# Patient Record
Sex: Male | Born: 1965
Health system: Southern US, Community
[De-identification: ages and names within clinical notes are randomized; demographics above are authoritative.]

## PROBLEM LIST (undated history)

## (undated) ENCOUNTER — Emergency Department (HOSPITAL_COMMUNITY): Admission: EM | Payer: Managed Care, Other (non HMO) | Source: Home / Self Care

## (undated) DIAGNOSIS — A630 Anogenital (venereal) warts: Secondary | ICD-10-CM

## (undated) DIAGNOSIS — E785 Hyperlipidemia, unspecified: Secondary | ICD-10-CM

## (undated) DIAGNOSIS — K635 Polyp of colon: Secondary | ICD-10-CM

## (undated) DIAGNOSIS — K219 Gastro-esophageal reflux disease without esophagitis: Secondary | ICD-10-CM

## (undated) DIAGNOSIS — H409 Unspecified glaucoma: Secondary | ICD-10-CM

## (undated) DIAGNOSIS — N529 Male erectile dysfunction, unspecified: Secondary | ICD-10-CM

## (undated) DIAGNOSIS — R739 Hyperglycemia, unspecified: Secondary | ICD-10-CM

## (undated) DIAGNOSIS — Z720 Tobacco use: Secondary | ICD-10-CM

## (undated) DIAGNOSIS — I10 Essential (primary) hypertension: Secondary | ICD-10-CM

## (undated) DIAGNOSIS — E039 Hypothyroidism, unspecified: Secondary | ICD-10-CM

## (undated) DIAGNOSIS — K5790 Diverticulosis of intestine, part unspecified, without perforation or abscess without bleeding: Secondary | ICD-10-CM

## (undated) DIAGNOSIS — G5601 Carpal tunnel syndrome, right upper limb: Secondary | ICD-10-CM

## (undated) DIAGNOSIS — I509 Heart failure, unspecified: Secondary | ICD-10-CM

## (undated) HISTORY — DX: Hyperlipidemia, unspecified: E78.5

## (undated) HISTORY — DX: Tobacco use: Z72.0

## (undated) HISTORY — DX: Gastro-esophageal reflux disease without esophagitis: K21.9

## (undated) HISTORY — DX: Hypothyroidism, unspecified: E03.9

## (undated) HISTORY — DX: Polyp of colon: K63.5

## (undated) HISTORY — DX: Hyperglycemia, unspecified: R73.9

## (undated) HISTORY — DX: Carpal tunnel syndrome, right upper limb: G56.01

## (undated) HISTORY — DX: Essential (primary) hypertension: I10

## (undated) HISTORY — DX: Male erectile dysfunction, unspecified: N52.9

## (undated) HISTORY — DX: Unspecified glaucoma: H40.9

## (undated) HISTORY — DX: Anogenital (venereal) warts: A63.0

## (undated) HISTORY — DX: Diverticulosis of intestine, part unspecified, without perforation or abscess without bleeding: K57.90

---

## 1999-02-11 ENCOUNTER — Encounter: Payer: Self-pay | Admitting: Emergency Medicine

## 1999-02-11 ENCOUNTER — Emergency Department (HOSPITAL_COMMUNITY): Admission: EM | Admit: 1999-02-11 | Discharge: 1999-02-11 | Payer: Self-pay | Admitting: Emergency Medicine

## 1999-07-26 ENCOUNTER — Emergency Department (HOSPITAL_COMMUNITY): Admission: EM | Admit: 1999-07-26 | Discharge: 1999-07-26 | Payer: Self-pay | Admitting: Emergency Medicine

## 2000-05-06 ENCOUNTER — Emergency Department (HOSPITAL_COMMUNITY): Admission: EM | Admit: 2000-05-06 | Discharge: 2000-05-06 | Payer: Self-pay | Admitting: Emergency Medicine

## 2000-06-04 ENCOUNTER — Emergency Department (HOSPITAL_COMMUNITY): Admission: EM | Admit: 2000-06-04 | Discharge: 2000-06-04 | Payer: Self-pay | Admitting: Emergency Medicine

## 2002-10-06 ENCOUNTER — Emergency Department (HOSPITAL_COMMUNITY): Admission: EM | Admit: 2002-10-06 | Discharge: 2002-10-06 | Payer: Self-pay

## 2003-05-02 ENCOUNTER — Emergency Department (HOSPITAL_COMMUNITY): Admission: EM | Admit: 2003-05-02 | Discharge: 2003-05-02 | Payer: Self-pay | Admitting: Emergency Medicine

## 2003-12-28 ENCOUNTER — Emergency Department (HOSPITAL_COMMUNITY): Admission: EM | Admit: 2003-12-28 | Discharge: 2003-12-28 | Payer: Self-pay | Admitting: Family Medicine

## 2004-08-29 ENCOUNTER — Emergency Department (HOSPITAL_COMMUNITY): Admission: EM | Admit: 2004-08-29 | Discharge: 2004-08-29 | Payer: Self-pay | Admitting: Family Medicine

## 2005-01-07 ENCOUNTER — Emergency Department (HOSPITAL_COMMUNITY): Admission: EM | Admit: 2005-01-07 | Discharge: 2005-01-07 | Payer: Self-pay | Admitting: Family Medicine

## 2005-03-16 ENCOUNTER — Emergency Department (HOSPITAL_COMMUNITY): Admission: EM | Admit: 2005-03-16 | Discharge: 2005-03-16 | Payer: Self-pay | Admitting: Family Medicine

## 2006-01-14 ENCOUNTER — Ambulatory Visit: Payer: Self-pay | Admitting: Internal Medicine

## 2006-02-04 ENCOUNTER — Ambulatory Visit: Payer: Self-pay | Admitting: Internal Medicine

## 2006-02-12 ENCOUNTER — Encounter: Admission: RE | Admit: 2006-02-12 | Discharge: 2006-02-12 | Payer: Self-pay | Admitting: Internal Medicine

## 2006-05-22 ENCOUNTER — Ambulatory Visit: Payer: Self-pay | Admitting: Internal Medicine

## 2006-06-18 ENCOUNTER — Ambulatory Visit: Payer: Self-pay | Admitting: Internal Medicine

## 2006-06-20 ENCOUNTER — Ambulatory Visit: Payer: Self-pay | Admitting: Internal Medicine

## 2006-09-16 ENCOUNTER — Ambulatory Visit: Payer: Self-pay | Admitting: Internal Medicine

## 2006-10-16 ENCOUNTER — Ambulatory Visit: Payer: Self-pay | Admitting: Internal Medicine

## 2006-10-16 LAB — CONVERTED CEMR LAB: TSH: 10.16 microintl units/mL — ABNORMAL HIGH (ref 0.35–5.50)

## 2006-12-15 ENCOUNTER — Ambulatory Visit: Payer: Self-pay | Admitting: Internal Medicine

## 2006-12-15 LAB — CONVERTED CEMR LAB: TSH: 12.3 microintl units/mL — ABNORMAL HIGH (ref 0.35–5.50)

## 2007-06-05 ENCOUNTER — Ambulatory Visit: Payer: Self-pay | Admitting: Internal Medicine

## 2007-06-05 LAB — CONVERTED CEMR LAB: TSH: 2.34 microintl units/mL (ref 0.35–5.50)

## 2007-07-04 ENCOUNTER — Encounter: Payer: Self-pay | Admitting: *Deleted

## 2007-07-04 DIAGNOSIS — K8020 Calculus of gallbladder without cholecystitis without obstruction: Secondary | ICD-10-CM | POA: Insufficient documentation

## 2007-07-04 DIAGNOSIS — E039 Hypothyroidism, unspecified: Secondary | ICD-10-CM | POA: Insufficient documentation

## 2008-01-01 ENCOUNTER — Ambulatory Visit: Payer: Self-pay | Admitting: Internal Medicine

## 2008-01-07 ENCOUNTER — Ambulatory Visit: Payer: Self-pay | Admitting: Internal Medicine

## 2008-01-07 DIAGNOSIS — R0789 Other chest pain: Secondary | ICD-10-CM | POA: Insufficient documentation

## 2008-01-07 DIAGNOSIS — E785 Hyperlipidemia, unspecified: Secondary | ICD-10-CM | POA: Insufficient documentation

## 2008-01-07 DIAGNOSIS — F17200 Nicotine dependence, unspecified, uncomplicated: Secondary | ICD-10-CM | POA: Insufficient documentation

## 2008-01-07 LAB — CONVERTED CEMR LAB
ALT: 26 units/L (ref 0–53)
AST: 19 units/L (ref 0–37)
Albumin: 3.6 g/dL (ref 3.5–5.2)
Alkaline Phosphatase: 57 units/L (ref 39–117)
BUN: 14 mg/dL (ref 6–23)
Basophils Absolute: 0 10*3/uL (ref 0.0–0.1)
Basophils Relative: 0.2 % (ref 0.0–1.0)
Bilirubin Urine: NEGATIVE
Bilirubin, Direct: 0.1 mg/dL (ref 0.0–0.3)
CO2: 29 meq/L (ref 19–32)
Calcium: 9.8 mg/dL (ref 8.4–10.5)
Chloride: 105 meq/L (ref 96–112)
Cholesterol: 230 mg/dL (ref 0–200)
Creatinine, Ser: 1.2 mg/dL (ref 0.4–1.5)
Direct LDL: 189.9 mg/dL
Eosinophils Absolute: 0.2 10*3/uL (ref 0.0–0.6)
Eosinophils Relative: 3.2 % (ref 0.0–5.0)
GFR calc Af Amer: 85 mL/min
GFR calc non Af Amer: 70 mL/min
Glucose, Bld: 109 mg/dL — ABNORMAL HIGH (ref 70–99)
HCT: 44.4 % (ref 39.0–52.0)
HDL: 35.7 mg/dL — ABNORMAL LOW (ref >39.0)
Hemoglobin, Urine: NEGATIVE
Hemoglobin: 14.4 g/dL (ref 13.0–17.0)
Ketones, ur: NEGATIVE mg/dL
Leukocytes, UA: NEGATIVE
Lymphocytes Relative: 27 % (ref 12.0–46.0)
MCHC: 32.4 g/dL (ref 30.0–36.0)
MCV: 83.2 fL (ref 78.0–100.0)
Monocytes Absolute: 0.7 10*3/uL (ref 0.2–0.7)
Monocytes Relative: 9.7 % (ref 3.0–11.0)
Neutro Abs: 4.4 10*3/uL (ref 1.4–7.7)
Neutrophils Relative %: 59.9 % (ref 43.0–77.0)
Nitrite: NEGATIVE
PSA: 1.71 ng/mL (ref 0.10–4.00)
Platelets: 267 10*3/uL (ref 150–400)
Potassium: 4 meq/L (ref 3.5–5.1)
RBC: 5.34 M/uL (ref 4.22–5.81)
RDW: 13.3 % (ref 11.5–14.6)
Sodium: 140 meq/L (ref 135–145)
Specific Gravity, Urine: 1.025 (ref 1.000–1.03)
TSH: 0.19 microintl units/mL — ABNORMAL LOW (ref 0.35–5.50)
Total Bilirubin: 0.6 mg/dL (ref 0.3–1.2)
Total CHOL/HDL Ratio: 6.4
Total Protein, Urine: NEGATIVE mg/dL
Total Protein: 7.5 g/dL (ref 6.0–8.3)
Triglycerides: 124 mg/dL (ref 0–149)
Urine Glucose: NEGATIVE mg/dL
Urobilinogen, UA: 0.2 (ref 0.0–1.0)
VLDL: 25 mg/dL (ref 0–40)
WBC: 7.2 10*3/uL (ref 4.5–10.5)
pH: 5.5 (ref 5.0–8.0)

## 2008-01-12 ENCOUNTER — Ambulatory Visit: Payer: Self-pay

## 2008-01-12 ENCOUNTER — Encounter: Payer: Self-pay | Admitting: Internal Medicine

## 2008-02-15 ENCOUNTER — Ambulatory Visit: Payer: Self-pay | Admitting: Internal Medicine

## 2008-02-16 LAB — CONVERTED CEMR LAB
Cholesterol: 208 mg/dL (ref 0–200)
Direct LDL: 155.6 mg/dL
Triglycerides: 78 mg/dL (ref 0–149)

## 2008-10-12 ENCOUNTER — Emergency Department (HOSPITAL_COMMUNITY): Admission: EM | Admit: 2008-10-12 | Discharge: 2008-10-13 | Payer: Self-pay | Admitting: Emergency Medicine

## 2009-05-12 ENCOUNTER — Ambulatory Visit: Payer: Self-pay | Admitting: Internal Medicine

## 2009-05-12 DIAGNOSIS — R498 Other voice and resonance disorders: Secondary | ICD-10-CM | POA: Insufficient documentation

## 2009-05-13 ENCOUNTER — Encounter: Payer: Self-pay | Admitting: Internal Medicine

## 2009-06-12 ENCOUNTER — Encounter: Payer: Self-pay | Admitting: Internal Medicine

## 2009-06-29 LAB — CONVERTED CEMR LAB
ALT: 21 units/L (ref 0–53)
Albumin: 4.2 g/dL (ref 3.5–5.2)
Bilirubin, Direct: 0.1 mg/dL (ref 0.0–0.3)
CO2: 25 meq/L (ref 19–32)
Cholesterol: 239 mg/dL — ABNORMAL HIGH (ref 0–200)
Glucose, Bld: 92 mg/dL (ref 70–99)
HDL: 38 mg/dL — ABNORMAL LOW (ref >39)
Potassium: 4.1 meq/L (ref 3.5–5.3)
Sodium: 139 meq/L (ref 135–145)
TSH: 2.634 microintl units/mL (ref 0.350–4.500)
Total Bilirubin: 0.4 mg/dL (ref 0.3–1.2)
Total CHOL/HDL Ratio: 6.3
VLDL: 24 mg/dL (ref 0–40)

## 2009-10-02 ENCOUNTER — Ambulatory Visit: Payer: Self-pay | Admitting: Internal Medicine

## 2009-12-04 ENCOUNTER — Ambulatory Visit: Payer: Self-pay | Admitting: Internal Medicine

## 2009-12-04 LAB — CONVERTED CEMR LAB
ALT: 27 units/L (ref 0–53)
AST: 21 units/L (ref 0–37)
Alkaline Phosphatase: 58 units/L (ref 39–117)
BUN: 11 mg/dL (ref 6–23)
Bilirubin, Direct: 0.1 mg/dL (ref 0.0–0.3)
Calcium: 8.9 mg/dL (ref 8.4–10.5)
Cholesterol: 198 mg/dL (ref 0–200)
Glucose, Bld: 107 mg/dL — ABNORMAL HIGH (ref 70–99)
Indirect Bilirubin: 0.5 mg/dL (ref 0.0–0.9)
Potassium: 3.7 meq/L (ref 3.5–5.3)
Thyroperoxidase Ab SerPl-aCnc: 20051.9 — ABNORMAL HIGH (ref 0.0–60.0)
Total Protein: 7.2 g/dL (ref 6.0–8.3)
Triglycerides: 122 mg/dL (ref <150)
VLDL: 24 mg/dL (ref 0–40)

## 2009-12-05 ENCOUNTER — Telehealth: Payer: Self-pay | Admitting: Internal Medicine

## 2010-03-12 ENCOUNTER — Telehealth: Payer: Self-pay | Admitting: Internal Medicine

## 2010-03-12 ENCOUNTER — Ambulatory Visit: Payer: Self-pay | Admitting: Internal Medicine

## 2010-03-12 DIAGNOSIS — N529 Male erectile dysfunction, unspecified: Secondary | ICD-10-CM | POA: Insufficient documentation

## 2010-03-12 LAB — CONVERTED CEMR LAB: TSH: 3.282 microintl units/mL (ref 0.350–4.500)

## 2010-03-13 ENCOUNTER — Telehealth: Payer: Self-pay | Admitting: Internal Medicine

## 2010-07-18 ENCOUNTER — Emergency Department (HOSPITAL_COMMUNITY): Admission: EM | Admit: 2010-07-18 | Discharge: 2010-07-18 | Payer: Self-pay | Admitting: Emergency Medicine

## 2010-07-23 ENCOUNTER — Ambulatory Visit: Payer: Self-pay | Admitting: Internal Medicine

## 2010-07-23 DIAGNOSIS — M549 Dorsalgia, unspecified: Secondary | ICD-10-CM | POA: Insufficient documentation

## 2010-07-23 DIAGNOSIS — M79609 Pain in unspecified limb: Secondary | ICD-10-CM | POA: Insufficient documentation

## 2010-07-30 ENCOUNTER — Ambulatory Visit: Payer: Self-pay | Admitting: Internal Medicine

## 2010-07-30 DIAGNOSIS — B079 Viral wart, unspecified: Secondary | ICD-10-CM | POA: Insufficient documentation

## 2010-07-31 ENCOUNTER — Encounter: Payer: Self-pay | Admitting: Internal Medicine

## 2010-07-31 LAB — CONVERTED CEMR LAB
Chlamydia, DNA Probe: NEGATIVE
GC Probe Amp, Genital: NEGATIVE

## 2010-08-01 ENCOUNTER — Telehealth: Payer: Self-pay | Admitting: Internal Medicine

## 2010-09-04 ENCOUNTER — Telehealth (INDEPENDENT_AMBULATORY_CARE_PROVIDER_SITE_OTHER): Payer: Self-pay | Admitting: *Deleted

## 2010-09-17 ENCOUNTER — Ambulatory Visit: Payer: Self-pay | Admitting: Internal Medicine

## 2010-09-17 DIAGNOSIS — I10 Essential (primary) hypertension: Secondary | ICD-10-CM | POA: Insufficient documentation

## 2010-12-04 NOTE — Progress Notes (Signed)
  Phone Note Other Incoming   Request: Send information Summary of Call: Request for records received from [place] D. Caralee Ates. Request forwarded to [facility].

## 2010-12-04 NOTE — Letter (Signed)
Summary: Office manager   Imported By: Lanelle Bal 12/14/2009 11:05:56  _____________________________________________________________________  External Attachment:    Type:   Image     Comment:   External Document

## 2010-12-04 NOTE — Progress Notes (Signed)
Summary: Lab Results  Phone Note Outgoing Call   Summary of Call: call pt - blood test shows he needs higher dose of thyroid medication.  see rx.  needs repeat TSH in 2 months Initial call taken by: D. Thomos Lemons DO,  December 05, 2009 8:17 AM  Follow-up for Phone Call        attempted to contact patient at [phone], no answer, voice message left to return call regarding medication change. Follow-up by: [name] CMA,  December 05, 2009 3:33 PM  Additional Follow-up for Phone Call Additional follow up Details #1::        attempted to contact patient at [phone], no answer voice message left to return call regarding medication change.  Call placed to [phone] informed by a male individual that patient does not work there Additional Follow-up by: [name] CMA,  December 06, 2009 10:23 AM    Additional Follow-up for Phone Call Additional follow up Details #2::    patient returned call requesting a return phone call to [phone] no answer, detailed voice message left informing patient per Dr Artist Pais instructions Follow-up by: [name] CMA,  December 07, 2009 1:09 PM  New/Updated Medications: LEVOTHYROXINE SODIUM 112 MCG TABS (LEVOTHYROXINE SODIUM) one by mouth once daily Prescriptions: LEVOTHYROXINE SODIUM 112 MCG TABS (LEVOTHYROXINE SODIUM) one by mouth once daily  #30 x 2   Entered and Authorized by:   D. Thomos Lemons DO   Signed by:   D. Thomos Lemons DO on 12/05/2009   Method used:   Electronically to        [facility] [phone]* (retail)       [address]       [place], [place]  [address]       Ph: [phone]       Fax: [phone]   RxID:   [id number]

## 2010-12-04 NOTE — Progress Notes (Signed)
Summary: Medication Refill  Phone Note Outgoing Call   Call placed by: [name] CMA,  Mar 12, 2010 8:36 AM Call placed to: Patient Summary of Call: call placed to patient regarding medication refill for thyroid. Patient is  due for Tsh draw prior to refill. Initial call taken by: [name] CMA,  Mar 12, 2010 8:37 AM  Follow-up for Phone Call        patient here for appointment , medication to be addressed at office visit.  Follow-up by: [name] CMA,  Mar 12, 2010 9:07 AM

## 2010-12-04 NOTE — Progress Notes (Signed)
Summary: Tsh result--lm  Phone Note Outgoing Call   Summary of Call: call pt - thyroid blood test shows he needs slightly higher dose of thyroid medication.  see rx.  he needs to come back to lab in 2 months for repeat TSH Initial call taken by: D. Thomos Lemons DO,  Mar 13, 2010 1:04 PM  Follow-up for Phone Call        Left message on machine to return my call. Cory [name] CMA  Mar 13, 2010 4:52 PM   Left message on machine to return my call.  Cory [name] CMA  Mar 14, 2010 10:47 AM   Additional Follow-up for Phone Call Additional follow up Details #1::        patient returned call and left voice message to  reach him at [phone]. Call waas returned to patient, he was advised per Dr Artist Pais instructions. He states he will have his labs drawn in July 2011 Additional Follow-up by: Cory [name] CMA,  Mar 14, 2010 1:57 PM    New/Updated Medications: LEVOTHYROXINE SODIUM 125 MCG TABS (LEVOTHYROXINE SODIUM) one by mouth once daily Prescriptions: LEVOTHYROXINE SODIUM 125 MCG TABS (LEVOTHYROXINE SODIUM) one by mouth once daily  #30 x 2   Entered and Authorized by:   D. Thomos Lemons DO   Signed by:   D. Thomos Lemons DO on 03/13/2010   Method used:   Electronically to        [facility] [phone]* (retail)       [address]       [place], [place]  [address]       Ph: [phone]       Fax: [phone]   RxID:   [id number]
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
## 2010-12-04 NOTE — Assessment & Plan Note (Signed)
Summary: 6 month follow up/mhf   Vital Signs:  Patient profile:   45 year old male Height:      65 inches Weight:      202.75 pounds BMI:     33.86 O2 Sat:      99 % on Room air Temp:     97.9 degrees F oral Pulse rate:   76 / minute Resp:     18 per minute BP sitting:   140 / 90  (right arm) Cuff size:   large  Vitals Entered By: [name] CMA (September 17, 2010 8:57 AM)  O2 Flow:  Room air CC: 6 Month Follow up Is Patient Diabetic? No Pain Assessment Patient in pain? no      Comments no concerns   Primary Care Provider:  Dondra Spry DO  CC:  6 Month Follow up.  History of Present Illness: 45 year old male for followup  intermittent cough AM and PM  smoking 1 pk q 3 days no shortness of breath  Genital condyloma - no recurrence since treatment with liquid nitrogen  Preventive Screening-Counseling & Management  Alcohol-Tobacco     Smoking Status: current  Allergies (verified): No Known Drug Allergies  Past History:  Past Medical History: Hypothyroidism  Hyperlipidemia   Tobacco abuse  GERD   Glaucoma mild - Dr. Mitzi Davenport Hypertension  Family History: Mother deceased at age 34 secondary to complications of breast cancer.  Father is alive at age 11 but has type 2 diabetes, history of stroke, glaucoma, is legally blind, and has hypertension.  Patient has 1 brother known to be diabetic.  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   Lungs:  normal respiratory effort and normal breath sounds.   Heart:  normal rate, regular rhythm, and no gallop.   Genitalia:  no verrucous lesions Extremities:  No lower extremity edema   Impression & Recommendations:  Problem # 1:  HYPERTENSION (ICD-401.9) Assessment  Deteriorated blood pressure is suboptimal. Increase hydrochlorothiazide to 25 mg His updated medication list for this problem includes:    Hydrochlorothiazide 12.5 Mg Tabs (Hydrochlorothiazide) .Marland Kitchen... 2 tabs by mouth once daily  BP today: 140/90 Prior BP: 126/70 (07/30/2010)  Prior 10 Yr Risk Heart Disease: 14 % (07/23/2010)  Labs Reviewed: K+: 3.7 (12/04/2009) Creat: : 1.11 (12/04/2009)   Chol: 198 (12/04/2009)   HDL: 40 (12/04/2009)   LDL: 134 (12/04/2009)   TG: 122 (12/04/2009)  Problem # 2:  HYPERLIPIDEMIA (ICD-272.4)  His updated medication list for this problem includes:    Lovastatin 20 Mg Tabs (Lovastatin) ..... One by mouth qpm  Labs Reviewed: SGOT: 21 (12/04/2009)   SGPT: 27 (12/04/2009)  Prior 10 Yr Risk Heart Disease: 14 % (07/23/2010)   HDL:40 (12/04/2009), 38 (05/12/2009)  LDL:134 (12/04/2009), 177 (05/12/2009)  Chol:198 (12/04/2009), 239 (05/12/2009)  Trig:122 (12/04/2009), 122 (05/12/2009)  Problem # 3:  CONDYLOMA (ICD-078.10) Assessment: Improved  Complete Medication List: 1)  Levothyroxine Sodium 125 Mcg Tabs (Levothyroxine sodium) .... One by mouth once daily 2)  Lovastatin 20 Mg Tabs (Lovastatin) .... One by mouth qpm 3)  Cyclobenzaprine Hcl 5 Mg Tabs (Cyclobenzaprine hcl) .... One by mouth two times a day as needed 4)  Hydrochlorothiazide 12.5 Mg Tabs (Hydrochlorothiazide) .Marland KitchenMarland KitchenMarland Kitchen  2 tabs by mouth once daily 5)  Potassium Chloride Crys Cr 20 Meq Cr-tabs (Potassium chloride crys cr) .... One by mouth once daily  Patient Instructions: 1)  Please schedule a follow-up appointment in 6 months. 2)  BMP prior to visit, ICD-9:  401.9 3)  Please return for lab work within  one month at [facility]. Prescriptions: LOVASTATIN 20 MG TABS (LOVASTATIN) one by mouth qpm  #90 x 1   Entered and Authorized by:   D. Thomos Lemons DO   Signed by:   D. Thomos Lemons DO on 09/17/2010   Method used:   Electronically to        [facility] [phone]* (retail)       [address]        [place], [place]  [address]       Ph: [phone]       Fax: [phone]   RxID:   [id number] LEVOTHYROXINE SODIUM 125 MCG TABS (LEVOTHYROXINE SODIUM) one by mouth once daily  #90 x 1   Entered and Authorized by:   D. Thomos Lemons DO   Signed by:   D. Thomos Lemons DO on 09/17/2010   Method used:   Electronically to        [facility] [phone]* (retail)       [address]       [place], [place]  [address]       Ph: [phone]       Fax: [phone]   RxID:   [id number] POTASSIUM CHLORIDE CRYS CR 20 MEQ CR-TABS (POTASSIUM CHLORIDE CRYS CR) one by mouth once daily  #90 x 3   Entered and Authorized by:   D. Thomos Lemons DO   Signed by:   D. Thomos Lemons DO on 09/17/2010   Method used:   Electronically to        [facility] [phone]* (retail)       [address]       [place], [place]  [address]       Ph: [phone]       Fax: [phone]   RxID:   [id number] HYDROCHLOROTHIAZIDE 12.5 MG TABS (HYDROCHLOROTHIAZIDE) 2 tabs by mouth once daily  #180 x 1   Entered and Authorized by:   D. Thomos Lemons DO   Signed by:   D. Thomos Lemons DO on 09/17/2010   Method used:   Electronically to        [facility] [phone]* (retail)       [address]       [place], [place]  [address]       Ph: [phone]       Fax: [phone]   RxID:   [id number]    Orders Added: 1)  Est. Patient Level II [[address]]    Current Allergies (reviewed today): No known allergies    Prevention & Chronic Care Immunizations   Influenza vaccine: Fluvax 3+  (07/30/2010)    Tetanus booster: 01/24/2003: given    Pneumococcal vaccine: Historical  (07/17/2009)  Other Screening   PSA: 1.71  (01/01/2008)   Smoking status: current  (09/17/2010)   Smoking cessation counseling: yes  (07/23/2010)  Lipids   Total Cholesterol: 198  (12/04/2009)   LDL: 134  (12/04/2009)   LDL Direct: 155.6  (02/15/2008)   HDL: 40  (12/04/2009)   Triglycerides: 122  (12/04/2009)    SGOT (AST): 21   (12/04/2009)   SGPT (ALT): 27  (12/04/2009)   Alkaline  phosphatase: 58  (12/04/2009)   Total bilirubin: 0.6  (12/04/2009)  Hypertension   Last Blood Pressure: 140 / 90  (09/17/2010)   Serum creatinine: 1.11  (12/04/2009)   Serum potassium 3.7  (12/04/2009)  Self-Management Support :    Hypertension self-management support: Not documented    Lipid self-management support: Not documented

## 2010-12-04 NOTE — Progress Notes (Signed)
Summary: Lab Results  Phone Note Outgoing Call   Summary of Call: call pt - HIV, RPR, gonorrhea and clamydia testing negative Initial call taken by: D. Thomos Lemons DO,  August 01, 2010 12:01 PM  Follow-up for Phone Call        call placed to patient at [phone], he was advised per Dr Artist Pais instructions. Follow-up by: [name] CMA,  August 01, 2010 1:18 PM

## 2010-12-04 NOTE — Letter (Signed)
Summary: Generic Letter  [place] [facility]  [address]   [place], [place] [address]   Phone: [phone]  Fax: [phone]      12/04/2009    Cory [name] [address] [place], [place]  [address]    To whom it may concern:      Cory [name] is currently involved in a smoking cessation program. If additional information is needed please contact our office at [phone].           Sincerely,   [name] CMA Dr Thomos Lemons

## 2010-12-04 NOTE — Assessment & Plan Note (Signed)
Summary: 1 month follow up/mhf   Vital Signs:  Patient profile:   45 year old male Height:      65 inches Weight:      202.75 pounds BMI:     33.86 O2 Sat:      99 % on Room air Temp:     97.5 degrees F oral Pulse rate:   67 / minute Pulse rhythm:   regular Resp:     18 per minute BP sitting:   128 / 80  (right arm) Cuff size:   large  Vitals Entered By: [name] CMA (Mar 12, 2010 8:59 AM)  O2 Flow:  Room air CC: Rm 3- 2 Month Follow up disease management   Primary Care Provider:  DThomos Lemons DO  CC:  Rm 3- 2 Month Follow up disease management.  History of Present Illness: 45 year old African American male for followup regarding hypothyroidism Patient reports taking medication regularly. He did not take antacids  tobacco abuse - he never started Chantix for fear of side effects  Preventive Screening-Counseling & Management  Alcohol-Tobacco     Smoking Status: current  Allergies (verified): No Known Drug Allergies  Past History:  Past Medical History: Hypothyroidism Hyperlipidemia   Tobacco abuse  GERD  Glaucoma mild - Dr. Mitzi Davenport  Family History: Mother deceased at age 26 secondary to complications of breast cancer.  Father is alive at age 22 but has type 2 diabetes, history of stroke, glaucoma, is legally blind, and has hypertension.  Patient has 1 brother known to be diabetic.  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   Lungs:  normal respiratory effort and normal breath sounds.   Heart:  normal rate, regular rhythm, and no gallop.   Extremities:  No lower extremity edema Psych:  normally interactive, good eye contact, not anxious appearing, and not  depressed appearing.     Impression & Recommendations:  Problem # 1:  HYPOTHYROIDISM (ICD-244.9) significantly elevated thyroid antibodies.  repeat TSH. adjust levothyroxine dose accordingly His updated medication list for this problem includes:    Levothyroxine Sodium 125 Mcg Tabs (Levothyroxine sodium) ..... One by mouth once daily  Orders: T-TSH ([address])  Problem # 2:  TOBACCO ABUSE (ICD-305.1) he did not take chantix for fear of side effects and cost reasons.   we discussed using bupropion for smoking cessation  The following medications were removed from the medication list:    Chantix Starting Month Pak 0.5 Mg X 11 & 1 Mg X 42 Tabs (Varenicline tartrate) .Marland Kitchen... Take as directed    Chantix Continuing Month Pak 1 Mg Tabs (Varenicline tartrate) .Marland Kitchen... Take as directed  Complete Medication List: 1)  Levothyroxine Sodium 125 Mcg Tabs (Levothyroxine sodium) .... One by mouth once daily 2)  Simvastatin 40 Mg Tabs (Simvastatin) .... One by mouth qpm 3)  Bupropion Hcl 75 Mg Tabs (Bupropion hcl) .... One by mouth two times a day  Patient Instructions: 1)  Please schedule a follow-up appointment in 6 months. 2)  Start bupropion and stop smoking within one week 3)  Continue bupropion 3 months even if you are able to quit smoking 4)  Start to  taper to 1/2 dose two times a day when you get to your last refill. 5)  Call our office if you experience any side effects from bupropion 6)  Take fish oil (omega 3 fatty acid) supplement daily.  see handout Prescriptions: BUPROPION HCL 75 MG TABS (BUPROPION HCL) one by mouth two times a day  #60 x 3   Entered and Authorized by:   D. Thomos Lemons DO   Signed by:   D. Thomos Lemons DO on 03/12/2010   Method used:   Electronically to        [facility] [phone]* (retail)       [address]       [place], [place]  [address]       Ph: [phone]       Fax: [phone]   RxID:   [id number]   Current Allergies (reviewed today): No  known allergies

## 2010-12-04 NOTE — Assessment & Plan Note (Signed)
Summary: cpx- jr   Vital Signs:  Patient profile:   45 year old male Height:      65 inches Weight:      200 pounds BMI:     33.40 O2 Sat:      100 % on Room air Temp:     97.6 degrees F oral Pulse rate:   71 / minute Pulse rhythm:   regular Resp:     18 per minute BP sitting:   122 / 70  (right arm) Cuff size:   large  Vitals Entered By: [name] CMA (December 04, 2009 8:25 AM)  O2 Flow:  Room air  Primary Care Provider:  D. Thomos Lemons DO  CC:  CPX.  History of Present Illness: CPX  45 y/o AA male with hx of hypothyroidism,  hyperlipidemia and tob use for routine CPX.  he denies significant interval hx  ENT notes reviewed re:   hoarseness  Preventive Screening-Counseling & Management  Alcohol-Tobacco     Smoking Status: current  Allergies (verified): No Known Drug Allergies  Past History:  Past Medical History: Hypothyroidism Hyperlipidemia  Tobacco abuse  GERD  Glaucoma FH reviewed for relevance  Family History: Mother deceased at age 31 secondary to complications of breast cancer.  Father is alive at age 58 but has type 2 diabetes, history of stroke, glaucoma, is legally blind, and has hypertension.  Patient has 1 brother known to be diabetic.  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   Head:  normocephalic and atraumatic.   Mouth:  pharynx pink and moist.   Neck:  No deformities, masses, or tenderness noted.no carotid bruits.   Lungs:  normal respiratory effort and normal breath sounds.   Heart:  normal rate, regular rhythm, and no gallop.   Abdomen:  soft, non-tender, normal bowel sounds, and no masses.   Pulses:  dorsalis pedis and  posterior tibial pulses are full and equal bilaterally Extremities:  No lower extremity edema Neurologic:  cranial nerves II-XII intact and gait normal.   Psych:  normally interactive, good eye contact, not anxious appearing, and not depressed appearing.     Impression & Recommendations:  Problem # 1:  HEALTH MAINTENANCE EXAM (ICD-V70.0)  Reviewed adult health maintenance protocols.  Td Booster: given (01/24/2003)   Flu Vax: Historical (07/17/2009)   Pneumovax: Historical (07/17/2009) Chol: 239 (05/12/2009)   HDL: 38 (05/12/2009)   LDL: 177 (05/12/2009)   TG: 122 (05/12/2009) TSH: 2.634 (05/12/2009)   PSA: 1.71 (01/01/2008)  EKG shows NSR @ 60 bpm.  non specific t wave changes.  Problem # 2:  HOARSENESS (JXB-147.82) Assessment: Comment Only Pt seen by ENT.  no sign of malignancy.  right vocal cord not moving well as left.  pt started on PPI. Orders:  Problem # 3:  TOBACCO ABUSE (ICD-305.1)  His updated medication list for this problem includes:    Chantix Starting Month Pak 0.5 Mg X 11 & 1 Mg X 42 Tabs (Varenicline tartrate) .Marland Kitchen... Take as directed    Chantix Continuing Month Pak 1 Mg Tabs (Varenicline tartrate) .Marland Kitchen... Take as directed  Encouraged smoking cessation and discussed different methods for smoking  cessation.   Problem # 4:  HYPOTHYROIDISM (ICD-244.9) Monitor TFTs.   Likely autoimmune etiology.  check thyroid antibodies His updated medication list for this problem includes:    Levothyroxine Sodium 112 Mcg Tabs (Levothyroxine sodium) ..... One by mouth once daily  Orders: T- * Misc. Laboratory test [phone])  Labs Reviewed: TSH: 2.634 (05/12/2009)    Chol: 239 (05/12/2009)   HDL: 38 (05/12/2009)   LDL: 177 (05/12/2009)   TG: 122 (05/12/2009)  Complete Medication List: 1)  Levothyroxine Sodium 112 Mcg Tabs (Levothyroxine sodium) .... One by mouth once daily 2)  Simvastatin 40 Mg Tabs (Simvastatin) .... One by mouth qpm 3)  Chantix Starting Month Pak 0.5 Mg X 11 & 1  Mg X 42 Tabs (Varenicline tartrate) .... Take as directed 4)  Chantix Continuing Month Pak 1 Mg Tabs (Varenicline tartrate) .... Take as directed  Other Orders: T-Basic Metabolic Panel [phone]) T-Hepatic Function [phone]) T-Lipid Profile [phone]) T-TSH [phone]) EKG w/ Interpretation (93000)  Patient Instructions: 1)  Please schedule a follow-up appointment in 2 months. 2)  Keep BP log at home. 3)  Limit your sodium intake to 2 - 2.5 grams of salt daily Prescriptions: SIMVASTATIN 40 MG  TABS (SIMVASTATIN) one by mouth qpm  #90 x 1   Entered and Authorized by:   D. Thomos Lemons DO   Signed by:   D. Thomos Lemons DO on 12/04/2009   Method used:   Electronically to        [facility]* (mail-order)             ,          Ph: [phone]       Fax: [phone]   RxID:   [id number] CHANTIX CONTINUING MONTH PAK 1 MG TABS (VARENICLINE TARTRATE) take as directed  #1 x 2   Entered and Authorized by:   D. Thomos Lemons DO   Signed by:   D. Thomos Lemons DO on 12/04/2009   Method used:   Print then Give to Patient   RxID:   [id number] CHANTIX STARTING MONTH PAK 0.5 MG X 11 & 1 MG X 42 TABS (VARENICLINE TARTRATE) take as directed  #1 x 0   Entered and Authorized by:   D. Thomos Lemons DO   Signed by:   D. Thomos Lemons DO on 12/04/2009   Method used:   Print then Give to Patient   RxID:   [id number]    Orders Added: 1)  T-Basic Metabolic Panel [[id number]] 2)  T-Hepatic Function [[id number]] 3)  T-Lipid Profile [80061-22930] 4)  T-TSH [06237-62831] 5)  T- * Misc. Laboratory test [99999] 6)  EKG w/ Interpretation [93000] 7)  Est. Patient age 14-64 [48]   Current Allergies (reviewed today): No known allergies    Immunization History:  Influenza Immunization History:    Influenza:  historical (07/17/2009)  Pneumovax Immunization History:    Pneumovax:  historical (07/17/2009)

## 2010-12-04 NOTE — Letter (Signed)
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         Sincerely,     [name] CMA (AAMA)

## 2010-12-04 NOTE — Assessment & Plan Note (Signed)
Summary: 1 week follow up/mhf   Vital Signs:  Patient profile:   45 year old male Weight:      200.75 pounds BMI:     33.53 O2 Sat:      99 % on Room air Temp:     98.1 degrees F oral Pulse rate:   76 / minute Pulse rhythm:   regular Resp:     18 per minute BP sitting:   126 / 70  (right arm) Cuff size:   large  Vitals Entered By: [name] CMA (July 30, 2010 8:52 AM)  O2 Flow:  Room air CC: follow-up visit Is Patient Diabetic? No Pain Assessment Patient in pain? no      Comments request blood work for  stds, concerned with green stools  lasted for 3 days  last week, patient states that he took Lovastatin and Hctz together and had a episode of sweating-FYI he had a beer at the time   Primary Care Provider:  D. Thomos Lemons DO  CC:  follow-up visit.  History of Present Illness: 45 y/o requests STD testing last unprotected sex - 3 wks to 1 month ago only sexual partner for last yr no genital symptoms noticed small bump on scrotum but now gone no hx of STDs  took BP and cholesterol medication together and noticed sweating   Allergies (verified): No Known Drug Allergies  Past History:  Past Medical History: Hypothyroidism  Hyperlipidemia   Tobacco abuse  GERD  Glaucoma mild - Dr. Mitzi Davenport  Family History: Mother deceased at age 58 secondary to complications of breast cancer.  Father is alive at age 29 but has type 2 diabetes, history of stroke, glaucoma, is legally blind, and has hypertension.  Patient has 1 brother known to be diabetic.  no prostate cancer  no colon cancer       Physical Exam  General:  alert, well-developed, and well-nourished.   Lungs:  normal respiratory effort and normal breath sounds.   Heart:  normal rate, regular rhythm, and no gallop.   Abdomen:  soft, non-tender, normal bowel sounds, no masses, no hepatomegaly, and no splenomegaly.   Genitalia:  uncircumcised.  scattered 1-2 mm verucous lesions on  foreskin   Impression & Recommendations:  Problem # 1:  SCREENING EXAMINATION FOR VENEREAL DISEASE (ICD-V74.5)  Orders: T-Syphilis Test (RPR) (72536-64403) T-HIV Antibody  (Reflex) [phone]) T-Culture, GC Screen ([id number]) Wart Destruct <14 (17110)  Problem # 2:  CONDYLOMA (ICD-078.10) genital condyloma tx'ed with liquid nitrogen  Orders: Wart Destruct <14 (17110)  Complete Medication List: 1)  Levothyroxine Sodium 125 Mcg Tabs (Levothyroxine sodium) .... One by mouth once daily 2)  Lovastatin 20 Mg Tabs (Lovastatin) .... One by mouth qpm 3)  Cyclobenzaprine Hcl 5 Mg Tabs (Cyclobenzaprine hcl) .... One by mouth two times a day as needed 4)  Hydrochlorothiazide 12.5 Mg Caps (Hydrochlorothiazide) .... One by mouth once daily  Other Orders: Flu Vaccine 65yrs + (88416) Admin 1st Vaccine (60630) Flu Vaccine 43yrs + MEDICARE PATIENTS (Z6010)  Patient Instructions: 1)  Please schedule a follow-up appointment in 1 month.  Current Allergies (reviewed today): No known allergies     Immunizations Administered:  Influenza Vaccine # 1:    Vaccine Type: Fluvax 3+    Site: left deltoid    Mfr: GlaxoSmithKline    Dose: 0.5 ml    Route: IM    Given by: [name] CMA    Exp. Date: 05/04/2011    Lot #: [id number]  VIS given: 05/29/10 version given July 30, 2010.  Flu Vaccine Consent Questions:    Do you have a history of severe allergic reactions to this vaccine? no    Any prior history of allergic reactions to egg and/or gelatin? no    Do you have a sensitivity to the preservative Thimersol? no    Do you have a past history of Guillan-Barre Syndrome? no    Do you currently have an acute febrile illness? no    Have you ever had a severe reaction to latex? no    Vaccine information given and explained to patient? yes

## 2011-01-07 ENCOUNTER — Telehealth: Payer: Self-pay | Admitting: Internal Medicine

## 2011-01-08 ENCOUNTER — Encounter: Payer: Self-pay | Admitting: Internal Medicine

## 2011-01-08 ENCOUNTER — Ambulatory Visit (INDEPENDENT_AMBULATORY_CARE_PROVIDER_SITE_OTHER): Payer: Self-pay | Admitting: Internal Medicine

## 2011-01-08 DIAGNOSIS — G5600 Carpal tunnel syndrome, unspecified upper limb: Secondary | ICD-10-CM

## 2011-01-08 DIAGNOSIS — E039 Hypothyroidism, unspecified: Secondary | ICD-10-CM

## 2011-01-08 DIAGNOSIS — I10 Essential (primary) hypertension: Secondary | ICD-10-CM

## 2011-01-15 NOTE — Progress Notes (Signed)
Summary: pain/numbness in fingers  Phone Note Call from Patient Call back at [facility] Phone [phone]   Caller: Patient Call For: D. Thomos Lemons DO Summary of Call: Pt called stating he has been having intermittent numbness and pain in the fingers of his right hand x 1 week. Denies weakness, visual changes or pain anywhere else. Advised pt he should be evaluated in the office and transferred pt to scheduler. Nicki Guadalajara Fergerson CMA Duncan Dull)  January 07, 2011 2:27 PM

## 2011-01-31 NOTE — Assessment & Plan Note (Signed)
Summary: fingers on right hand numb/ss   Vital Signs:  Patient profile:   45 year old male Height:      65 inches Weight:      200.25 pounds BMI:     33.44 O2 Sat:      98 % on Room air Temp:     97.5 degrees F oral Pulse rate:   67 / minute Resp:     18 per minute BP sitting:   130 / 80  (right arm) Cuff size:   large  Vitals Entered By: [name] CMA (January 08, 2011 10:01 AM)  O2 Flow:  Room air CC: fingers numbing Is Patient Diabetic? No Pain Assessment Patient in pain? no        Primary Care Provider:  Dondra Spry DO  CC:  fingers numbing.  History of Present Illness: 45 y/o male c/o right hand numbness fo right hand intermittently for the past week. no arm numbness no weakness   Preventive Screening-Counseling & Management  Alcohol-Tobacco     Smoking Status: current  Allergies (verified): No Known Drug Allergies  Past History:  Past Medical History: Hypothyroidism   Hyperlipidemia   Tobacco abuse  GERD   Glaucoma mild - Dr. Mitzi Davenport Hypertension  Physical Exam  General:  alert, well-developed, and well-nourished.   Lungs:  normal respiratory effort and normal breath sounds.   Heart:  normal rate, regular rhythm, and no gallop.   Msk:  positive phalens Neurologic:  cranial nerves II-XII intact, strength normal in all extremities, gait normal, and DTRs symmetrical and normal.     Impression & Recommendations:  Problem # 1:  HYPERTENSION (ICD-401.9) Assessment Unchanged  The following medications were removed from the medication list:    Hydrochlorothiazide 12.5 Mg Tabs (Hydrochlorothiazide) .Marland Kitchen... 2 tabs by mouth once daily  BP today: 130/80 Prior BP: 140/90 (09/17/2010)  Prior 10 Yr Risk Heart Disease: 14 % (07/23/2010)  Labs Reviewed: K+: 3.7 (12/04/2009) Creat: : 1.11 (12/04/2009)   Chol: 198 (12/04/2009)   HDL: 40 (12/04/2009)   LDL: 134 (12/04/2009)   TG: 122 (12/04/2009)  Problem # 2:  TOBACCO ABUSE  (ICD-305.1)  Encouraged smoking cessation and discussed different methods for smoking cessation.   Problem # 3:  CARPAL TUNNEL SYNDROME, RIGHT (ICD-354.0) right hand symptoms c/w CTS use of splint and stretching exercises reviewed  Problem # 4:  HYPOTHYROIDISM (ICD-244.9)  His updated medication list for this problem includes:    Levothyroxine Sodium 125 Mcg Tabs (Levothyroxine sodium) ..... One by mouth once daily  Orders: TLB-TSH (Thyroid Stimulating Hormone) (84443-TSH)  Labs Reviewed: TSH: 3.282 (03/12/2010)    Chol: 198 (12/04/2009)   HDL: 40 (12/04/2009)   LDL: 134 (12/04/2009)   TG: 122 (12/04/2009)  Complete Medication List: 1)  Levothyroxine Sodium 125 Mcg Tabs (Levothyroxine sodium) .... One by mouth once daily 2)  Lovastatin 20 Mg Tabs (Lovastatin) .... One by mouth qpm  Patient Instructions: 1)  Please schedule a follow-up appointment in 6  months. Prescriptions: LOVASTATIN 20 MG TABS (LOVASTATIN) one by mouth qpm  #90 x 1   Entered and Authorized by:   D. Thomos Lemons DO   Signed by:   D. Thomos Lemons DO on 01/08/2011   Method used:   Print then Give to Patient   RxID:   [id number] LEVOTHYROXINE SODIUM 125 MCG TABS (LEVOTHYROXINE SODIUM) one by mouth once daily  #90 x 1   Entered and Authorized by:   D. Thomos Lemons DO  Signed by:   D. Thomos Lemons DO on 01/08/2011   Method used:   Print then Give to Patient   RxID:   [id number]    Orders Added: 1)  TLB-TSH (Thyroid Stimulating Hormone) [84443-TSH] 2)  Est. Patient Level II [70350]    Current Allergies (reviewed today): No known allergies

## 2011-03-18 ENCOUNTER — Ambulatory Visit: Payer: Self-pay | Admitting: Internal Medicine

## 2011-03-20 ENCOUNTER — Encounter: Payer: Self-pay | Admitting: Internal Medicine

## 2011-04-03 ENCOUNTER — Ambulatory Visit: Payer: Self-pay | Admitting: Internal Medicine

## 2011-04-10 ENCOUNTER — Ambulatory Visit: Payer: Self-pay | Admitting: Internal Medicine

## 2011-04-12 ENCOUNTER — Other Ambulatory Visit (INDEPENDENT_AMBULATORY_CARE_PROVIDER_SITE_OTHER): Payer: Self-pay

## 2011-04-12 DIAGNOSIS — I10 Essential (primary) hypertension: Secondary | ICD-10-CM

## 2011-04-12 LAB — BASIC METABOLIC PANEL
BUN: 13 mg/dL (ref 6–23)
Calcium: 8.9 mg/dL (ref 8.4–10.5)
Creatinine, Ser: 1.2 mg/dL (ref 0.4–1.5)
GFR: 81.07 mL/min (ref >60.00)
Glucose, Bld: 145 mg/dL — ABNORMAL HIGH (ref 70–99)
Potassium: 4.3 mEq/L (ref 3.5–5.1)

## 2011-04-14 ENCOUNTER — Encounter: Payer: Self-pay | Admitting: Family

## 2011-04-14 ENCOUNTER — Telehealth: Payer: Self-pay | Admitting: Family

## 2011-04-14 DIAGNOSIS — R739 Hyperglycemia, unspecified: Secondary | ICD-10-CM

## 2011-04-14 HISTORY — DX: Hyperglycemia, unspecified: R73.9

## 2011-04-14 NOTE — Telephone Encounter (Signed)
Pls call patient and let him know that his sugar was elevated on his blood work.  I would like for him to return for an A1C (diagnosis Hypergycemia) and keep his upcoming appointment with Dr. Artist Pais.

## 2011-04-15 NOTE — Telephone Encounter (Signed)
Call placed to patient at [phone], he was advised per [facility]'s instructions, and has verbalized understanding. He states that he will have blood work drawn in [place] at the [facility]

## 2011-04-17 ENCOUNTER — Other Ambulatory Visit (INDEPENDENT_AMBULATORY_CARE_PROVIDER_SITE_OTHER): Payer: Self-pay

## 2011-04-17 DIAGNOSIS — R7309 Other abnormal glucose: Secondary | ICD-10-CM

## 2011-04-17 DIAGNOSIS — R739 Hyperglycemia, unspecified: Secondary | ICD-10-CM

## 2011-04-17 LAB — HEMOGLOBIN A1C: Hgb A1c MFr Bld: 6.2 % (ref 4.6–6.5)

## 2011-05-01 ENCOUNTER — Ambulatory Visit: Payer: Self-pay | Admitting: Internal Medicine

## 2011-05-02 ENCOUNTER — Encounter: Payer: Self-pay | Admitting: Internal Medicine

## 2011-05-02 ENCOUNTER — Ambulatory Visit (INDEPENDENT_AMBULATORY_CARE_PROVIDER_SITE_OTHER): Payer: Self-pay | Admitting: Internal Medicine

## 2011-05-02 DIAGNOSIS — E039 Hypothyroidism, unspecified: Secondary | ICD-10-CM

## 2011-05-02 DIAGNOSIS — I10 Essential (primary) hypertension: Secondary | ICD-10-CM

## 2011-05-02 DIAGNOSIS — E785 Hyperlipidemia, unspecified: Secondary | ICD-10-CM

## 2011-05-02 LAB — LIPID PANEL
LDL Cholesterol: 130 mg/dL — ABNORMAL HIGH (ref 0–99)
Triglycerides: 147 mg/dL (ref <150)
VLDL: 29 mg/dL (ref 0–40)

## 2011-05-02 LAB — HEPATIC FUNCTION PANEL
Indirect Bilirubin: 0.2 mg/dL (ref 0.0–0.9)
Total Bilirubin: 0.3 mg/dL (ref 0.3–1.2)
Total Protein: 7.6 g/dL (ref 6.0–8.3)

## 2011-05-03 NOTE — Assessment & Plan Note (Signed)
Stable. Asymptomatic. Obtain TSH.

## 2011-05-03 NOTE — Progress Notes (Signed)
  Subjective:    Patient ID: Cory [name], male    DOB: 15-Aug-1966, 45 y.o.   MRN: [id number]  HPI Patient presents to clinic for evaluation of hypertension and hyperlipidemia. Tolerate statin therapy without myalgias or abnormal LFTs.states occasionally misses statin dosing. Blood pressure minimally elevated without symptoms of headache dizziness chest pain or shortness of breath. Has history of hypothyroidism without symptoms of hyper or hypothyroidism. Has history of hyperglycemia with recent A1c 6.2 and blood sugar of 145. Previous glucoses have been 107 and 92. No polyuria or polydipsia. No other complaints.  Reviewed past medical history, medications and allergies   Review of Systems see history of present illness     Objective:   Physical Exam    Physical Exam  Vitals reviewed. Constitutional:  appears well-developed and well-nourished. No distress.  HENT:  Head: Normocephalic and atraumatic.  Right Ear: Tympanic membrane, external ear and ear canal normal.  Left Ear: Tympanic membrane, external ear and ear canal normal.  Nose: Nose normal.  Mouth/Throat: Oropharynx is clear and moist. No oropharyngeal exudate.  Eyes: Conjunctivae and EOM are normal. Pupils are equal, round, and reactive to light. Right eye exhibits no discharge. Left eye exhibits no discharge. No scleral icterus.  Neck: Neck supple. No thyromegaly present.  Cardiovascular: Normal rate, regular rhythm and normal heart sounds.  Exam reveals no gallop and no friction rub.   No murmur heard. Pulmonary/Chest: Effort normal and breath sounds normal. No respiratory distress.  has no wheezes.  has no rales.  Lymphadenopathy:   no cervical adenopathy.  Neurological:  is alert.  Skin: Skin is warm and dry.  not diaphoretic.  Psychiatric: normal mood and affect.      Assessment & Plan:

## 2011-05-03 NOTE — Assessment & Plan Note (Signed)
Stable. Continue statin therapy. Obtain fasting lipid profile and liver function tests

## 2011-05-03 NOTE — Assessment & Plan Note (Signed)
Mild elevation. Asymptomatic. Low-sodium diet and exercise recommended. Maintain outpatient blood pressure log and submit results for review.

## 2011-05-09 ENCOUNTER — Other Ambulatory Visit: Payer: Self-pay | Admitting: *Deleted

## 2011-05-09 MED ORDER — LEVOTHYROXINE SODIUM 150 MCG PO TABS: 150.0000 ug | ORAL_TABLET | Freq: Every day | ORAL | Status: DC

## 2011-05-09 NOTE — Telephone Encounter (Signed)
See lab note from 05/09/2011

## 2011-08-05 ENCOUNTER — Other Ambulatory Visit: Payer: Self-pay

## 2011-08-12 ENCOUNTER — Ambulatory Visit: Payer: Self-pay | Admitting: Internal Medicine

## 2011-08-26 ENCOUNTER — Encounter: Payer: Self-pay | Admitting: Family

## 2011-08-26 ENCOUNTER — Encounter: Payer: Self-pay | Admitting: Internal Medicine

## 2011-08-26 ENCOUNTER — Ambulatory Visit (INDEPENDENT_AMBULATORY_CARE_PROVIDER_SITE_OTHER): Payer: Self-pay | Admitting: Family

## 2011-08-26 DIAGNOSIS — K648 Other hemorrhoids: Secondary | ICD-10-CM | POA: Insufficient documentation

## 2011-08-26 DIAGNOSIS — R739 Hyperglycemia, unspecified: Secondary | ICD-10-CM

## 2011-08-26 DIAGNOSIS — E039 Hypothyroidism, unspecified: Secondary | ICD-10-CM

## 2011-08-26 DIAGNOSIS — G5600 Carpal tunnel syndrome, unspecified upper limb: Secondary | ICD-10-CM

## 2011-08-26 DIAGNOSIS — K625 Hemorrhage of anus and rectum: Secondary | ICD-10-CM

## 2011-08-26 DIAGNOSIS — R7309 Other abnormal glucose: Secondary | ICD-10-CM

## 2011-08-26 MED ORDER — LOVASTATIN 20 MG PO TABS: 20.0000 mg | ORAL_TABLET | Freq: Every evening | ORAL | Status: DC

## 2011-08-26 NOTE — Patient Instructions (Signed)
Please complete your blood work prior to leaving. You will be contacted about your referral to Gastroenterology. Go to the ER if you develop severe rectal bleeding. Follow up in 3 months.

## 2011-08-26 NOTE — Assessment & Plan Note (Signed)
Deteriorated.  I recommended that he purchase a right wrist brace to wear at bed time and when he is at home after work.

## 2011-08-26 NOTE — Assessment & Plan Note (Signed)
Check TSH 

## 2011-08-26 NOTE — Progress Notes (Signed)
  Subjective:    Patient ID: Cory [name], male    DOB: 1966/08/15, 45 y.o.   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  He is right handed. He reports that for several weeks he has had numbness in the right hand (all fingers except the pinky.  Notes tingling sensation.  Not worse at night.  Denies associated pain or weakness.  + pain on the right side of the neck but denies associated radiculopathy. Notes + hx of carpal tunnel syndrome on the left.    2) Hypothyroid- reports medication compliance.  He ran out 2 days ago.  Reports that he weight has been stable.    3)BRBPR- streaks of blood on the stool. notes that he saw this on Thursday and Friday.  Now normal color.  He does report that he saw some dark stool.  He had diarrhea today x 3 with some associated mid abdominal cramping.  + nausea, no vomitting.      Review of Systems Past Medical History  Diagnosis Date  . Hypothyroidism   . Hyperlipidemia   . Tobacco abuse   . Mild stage glaucoma     Dr Mitzi Davenport  . Hypertension   . GERD (gastroesophageal reflux disease)   . Hyperglycemia 04/14/2011    History   Social History  . Marital Status: Married    Spouse Name: N/A    Number of Children: N/A  . Years of Education: N/A   Occupational History  . Not on file.   Social History Main Topics  . Smoking status: Current Everyday Smoker  . Smokeless tobacco: Never Used   Comment: "light" smoker since age 19  . Alcohol Use: Yes  . Drug Use: Yes  . Sexually Active: Not on file   Other Topics Concern  . Not on file   Social History Narrative   Marriedoccupation :  [place] [employer] - [employer] officer, nature,  [employer] Current Smoker  - "light" smoker since age 49 Alcohol use-no     No past surgical history on file.  Family History  Problem Relation Age of Onset  . Breast cancer Mother     deceased at age 33  secondary to complications of breast cancer  . Diabetes Father   . Stroke Father   . Glaucoma Father   . Hypertension Father   . Blindness Father     leagally blind  . Diabetes Brother   . Other Neg Hx     prostate cancer, colon cancer    No Known Allergies  Current Outpatient Prescriptions on File Prior to Visit  Medication Sig Dispense Refill  . levothyroxine (SYNTHROID, LEVOTHROID) 150 MCG tablet Take 1 tablet (150 mcg total) by mouth daily.  30 tablet  3  . lovastatin (MEVACOR) 20 MG tablet Take 20 mg by mouth every evening.          BP 140/80  Pulse 77  Temp(Src) 98.1 F (36.7 C) (Oral)  Resp 18  SpO2 100%       Objective:   Physical Exam  Gen: awake, alert, NAD CV: s1/s2, RRR Resp: BS CTA bilaterally, no W/R/R Abd: soft, NT Rectal exam- normal, Heme negative.  Prostate normal- no enlargement or palpable nodules. Neuro- slightly diminished right hand grasp.  Mildly + Tinels right      Assessment & Plan:

## 2011-08-26 NOTE — Assessment & Plan Note (Signed)
This is resolved.  Will check CBC and refer to GI for further evaluation.

## 2011-08-27 ENCOUNTER — Telehealth: Payer: Self-pay | Admitting: Family

## 2011-08-27 DIAGNOSIS — E039 Hypothyroidism, unspecified: Secondary | ICD-10-CM

## 2011-08-27 LAB — CBC
HCT: 45.8 % (ref 39.0–52.0)
Hemoglobin: 15.1 g/dL (ref 13.0–17.0)
MCH: 28.1 pg (ref 26.0–34.0)
MCHC: 33 g/dL (ref 30.0–36.0)
Platelets: 259 10*3/uL (ref 150–400)
RBC: 5.37 MIL/uL (ref 4.22–5.81)
RDW: 14.1 % (ref 11.5–15.5)

## 2011-08-27 LAB — T4, FREE: Free T4: 0.82 ng/dL (ref 0.80–1.80)

## 2011-08-27 LAB — TSH: TSH: 7.69 u[IU]/mL — ABNORMAL HIGH (ref 0.350–4.500)

## 2011-08-27 MED ORDER — LEVOTHYROXINE SODIUM 175 MCG PO TABS: 175.0000 ug | ORAL_TABLET | Freq: Every day | ORAL | Status: DC

## 2011-08-27 NOTE — Telephone Encounter (Signed)
Left message on machine to return my call. 

## 2011-08-27 NOTE — Telephone Encounter (Signed)
Please call pt and let him know that his thyroid medication needs to be increased.  I have sent new dose to his pharmacy.  He will need a follow up TSH in 6 weeks please.  Blood count and sugar look good.

## 2011-08-27 NOTE — Telephone Encounter (Signed)
Pt notified and requests lab order be placed for the [facility] lab. Advised pt to return for repeat TSH the week of 10/07/11. Lab order has been entered.

## 2011-09-13 ENCOUNTER — Ambulatory Visit: Payer: Self-pay | Admitting: Internal Medicine

## 2011-09-30 ENCOUNTER — Telehealth: Payer: Self-pay | Admitting: Family

## 2011-09-30 NOTE — Telephone Encounter (Signed)
Refill- levothyroxin tab. Take one tablet by mouth every day. Qty 90. Last fill 4.25.12

## 2011-10-01 NOTE — Telephone Encounter (Signed)
New rx for was sent to pharmacy on 08/27/11 x 1 refill. Pt should be getting that rx filled. Spoke to Diane at [facility] and she stated the still had 1 refill on file. She will fill that rx and cancel the request.

## 2011-10-09 ENCOUNTER — Other Ambulatory Visit (INDEPENDENT_AMBULATORY_CARE_PROVIDER_SITE_OTHER): Payer: Self-pay

## 2011-10-09 ENCOUNTER — Ambulatory Visit (INDEPENDENT_AMBULATORY_CARE_PROVIDER_SITE_OTHER): Payer: Self-pay | Admitting: Internal Medicine

## 2011-10-09 ENCOUNTER — Encounter: Payer: Self-pay | Admitting: Family

## 2011-10-09 ENCOUNTER — Encounter: Payer: Self-pay | Admitting: Internal Medicine

## 2011-10-09 VITALS — BP 160/90 | HR 76 | Ht 65.0 in | Wt 197.6 lb

## 2011-10-09 DIAGNOSIS — K648 Other hemorrhoids: Secondary | ICD-10-CM

## 2011-10-09 DIAGNOSIS — E039 Hypothyroidism, unspecified: Secondary | ICD-10-CM

## 2011-10-09 LAB — TSH: TSH: 0.44 u[IU]/mL (ref 0.35–5.50)

## 2011-10-09 MED ORDER — HYDROCORTISONE 2.5 % RE CREA: TOPICAL_CREAM | Freq: Two times a day (BID) | RECTAL | Status: AC | PRN

## 2011-10-09 NOTE — Assessment & Plan Note (Signed)
I saw these on anoscopic exam today. They are the likely cause of bleeding. I did recommend a colonoscopy for completeness given his age, he decided not to pursue that. He understands there is a possible risk of a higher lesion causing the bleeding, specifically polyps or tumor such as a cancer.  Treat with hydrocortisone 2.5%. Hemorrhoid information given as well. He does not appear to be constipated or straining.

## 2011-10-09 NOTE — Progress Notes (Signed)
  Subjective:    Patient ID: Cory [name], male    DOB: [birth year], 45 y.o.   MRN: [id number]  HPI This 45 year old [place] American man has had intermittent rectal bleeding since around October. Not every day, streaks of bright red blood on the stool. Some of the stools been dark but when questioned these are not melenic. There is no change in stool caliber. No change in stool frequency or straining. Also has seen some bright red blood on the tissue paper.   Review of Systems He denies any other problems, all other review of systems negative    Objective:   Physical Exam General:  Well-developed, well-nourished and in no acute distress Eyes:  Bilateral arcus ENT:   Mouth and posterior pharynx free of lesions.  Neck:   supple w/o thyromegaly or mass.  Lungs: Clear to auscultation bilaterally. Heart:  S1S2, no rubs, murmurs, gallops. Abdomen:  soft, non-tender, no hepatosplenomegaly, hernia, or mass and BS+.  Rectal: Brown stool, no mass, normal prostate. Anoscopy performed demonstrating inflamed swollen   hemorrhoids in the anal canal.    Data Reviewed: Labs prior office notes. Hemoglobin normal in October        Assessment & Plan:

## 2011-10-09 NOTE — Patient Instructions (Addendum)
You have been given a separate informational sheet regarding your tobacco use, the importance of quitting and local resources to help you quit. Your blood pressure was  high today you should followup with your primary care doctor about that. If the rectal bleeding does not stop with treatment return here. You should have a colonoscopy by the time you are age 45 as part of routine preventive care or if your bleeding does not stop. Your prescription(s) has(have) been sent to your pharmacy for you to pick up (Anusol suppository). Hemorrhoid handout given to you today to review.

## 2011-10-21 ENCOUNTER — Ambulatory Visit: Payer: Self-pay | Admitting: Internal Medicine

## 2011-10-24 ENCOUNTER — Emergency Department (HOSPITAL_COMMUNITY)
Admission: EM | Admit: 2011-10-24 | Discharge: 2011-10-25 | Disposition: A | Discharge status: Home / Self Care | Payer: Self-pay | Attending: Emergency Medicine | Admitting: Emergency Medicine

## 2011-10-24 ENCOUNTER — Encounter (HOSPITAL_COMMUNITY): Payer: Self-pay | Admitting: Emergency Medicine

## 2011-10-24 DIAGNOSIS — E039 Hypothyroidism, unspecified: Secondary | ICD-10-CM | POA: Insufficient documentation

## 2011-10-24 DIAGNOSIS — F17200 Nicotine dependence, unspecified, uncomplicated: Secondary | ICD-10-CM | POA: Insufficient documentation

## 2011-10-24 DIAGNOSIS — I10 Essential (primary) hypertension: Secondary | ICD-10-CM | POA: Insufficient documentation

## 2011-10-24 DIAGNOSIS — R197 Diarrhea, unspecified: Secondary | ICD-10-CM | POA: Insufficient documentation

## 2011-10-24 DIAGNOSIS — E785 Hyperlipidemia, unspecified: Secondary | ICD-10-CM | POA: Insufficient documentation

## 2011-10-24 DIAGNOSIS — R112 Nausea with vomiting, unspecified: Secondary | ICD-10-CM | POA: Insufficient documentation

## 2011-10-24 DIAGNOSIS — H409 Unspecified glaucoma: Secondary | ICD-10-CM | POA: Insufficient documentation

## 2011-10-24 DIAGNOSIS — K219 Gastro-esophageal reflux disease without esophagitis: Secondary | ICD-10-CM | POA: Insufficient documentation

## 2011-10-24 NOTE — ED Notes (Signed)
PT. REPORTS MID ABDOMINAL PAIN WITH VOMITTING ONSET 3 WEEKS AGO WITH DIARRHEA WITH FEVER AND CHILLS.

## 2011-10-25 LAB — POCT I-STAT, CHEM 8
Calcium, Ion: 1.13 mmol/L (ref 1.12–1.32)
HCT: 52 % (ref 39.0–52.0)
TCO2: 25 mmol/L (ref 0–100)

## 2011-10-25 LAB — CBC
MCH: 29.4 pg (ref 26.0–34.0)
MCV: 85.3 fL (ref 78.0–100.0)
Platelets: 229 10*3/uL (ref 150–400)
RBC: 5.57 MIL/uL (ref 4.22–5.81)

## 2011-10-25 LAB — HEPATIC FUNCTION PANEL
ALT: 28 U/L (ref 0–53)
Alkaline Phosphatase: 64 U/L (ref 39–117)
Bilirubin, Direct: <0.1 mg/dL (ref 0.0–0.3)
Total Bilirubin: 0.4 mg/dL (ref 0.3–1.2)

## 2011-10-25 LAB — DIFFERENTIAL
Eosinophils Absolute: 0 10*3/uL (ref 0.0–0.7)
Eosinophils Relative: 0 % (ref 0–5)
Lymphs Abs: 0.8 10*3/uL (ref 0.7–4.0)
Monocytes Relative: 5 % (ref 3–12)

## 2011-10-25 MED ORDER — GI COCKTAIL ~~LOC~~
30.0000 mL | Freq: Once | ORAL | Status: AC
  Administered 2011-10-25: 30 mL via ORAL
  Filled 2011-10-25: qty 30

## 2011-10-25 MED ORDER — ONDANSETRON HCL 4 MG/2ML IJ SOLN
4.0000 mg | Freq: Once | INTRAMUSCULAR | Status: AC
  Administered 2011-10-25: 4 mg via INTRAVENOUS
  Filled 2011-10-25: qty 2

## 2011-10-25 MED ORDER — SODIUM CHLORIDE 0.9 % IV BOLUS (SEPSIS)
1000.0000 mL | Freq: Once | INTRAVENOUS | Status: AC
  Administered 2011-10-25: 1000 mL via INTRAVENOUS

## 2011-10-25 MED ORDER — ONDANSETRON HCL 4 MG PO TABS: 4.0000 mg | ORAL_TABLET | Freq: Four times a day (QID) | ORAL | Status: AC

## 2011-10-25 NOTE — ED Notes (Signed)
Pt asleep.

## 2011-10-25 NOTE — ED Notes (Signed)
Patient is resting comfortably. 

## 2011-10-25 NOTE — ED Provider Notes (Signed)
History     CSN: [id number]  Arrival date & time 10/24/11  2132   First MD Initiated Contact with Patient 10/24/11 2348      No chief complaint on file.   (Consider location/radiation/quality/duration/timing/severity/associated sxs/prior treatment) Patient is a 45 y.o. male presenting with vomiting. The history is provided by the patient. No language interpreter was used.  Emesis  This is a recurrent problem. The current episode started 12 to 24 hours ago. The problem occurs more than 10 times per day. The problem has not changed since onset.The emesis has an appearance of stomach contents. There has been no fever. Associated symptoms include diarrhea. Pertinent negatives include no arthralgias, no chills, no cough, no fever, no headaches, no myalgias, no sweats and no URI. Risk factors include ill contacts.  Started while at his child's school. Had a similar episode one month ago.  Last few bouts of emesis had blood streaking.    Past Medical History  Diagnosis Date  . Hypothyroidism   . Hyperlipidemia   . Tobacco abuse   . Mild stage glaucoma     Dr Mitzi Davenport  . Hypertension   . GERD (gastroesophageal reflux disease)   . Hyperglycemia 04/14/2011  . ED (erectile dysfunction)   . Cholelithiasis   . Carpal tunnel syndrome of right wrist   . Condyloma     History reviewed. No pertinent past surgical history.  Family History  Problem Relation Age of Onset  . Breast cancer Mother     deceased at age 58 secondary to complications of breast cancer  . Diabetes Father   . Stroke Father   . Glaucoma Father   . Hypertension Father   . Blindness Father     leagally blind  . Diabetes Brother   . Other Neg Hx     prostate cancer, colon cancer  . Alcohol abuse Brother   . Seizures Brother   . Heart disease Father     History  Substance Use Topics  . Smoking status: Current Everyday Smoker -- 0.5 packs/day    Types: Cigarettes  . Smokeless tobacco: Never Used   Comment:  "light" smoker since age 52  . Alcohol Use: Yes     22 ounces daily      Review of Systems  Constitutional: Negative for fever and chills.  HENT: Negative for facial swelling, neck pain and neck stiffness.   Eyes: Negative for discharge.  Respiratory: Negative for cough.   Cardiovascular: Negative for chest pain.  Gastrointestinal: Positive for vomiting and diarrhea. Negative for abdominal distention.  Genitourinary: Negative for dysuria.  Musculoskeletal: Negative for myalgias and arthralgias.  Skin: Negative.   Neurological: Negative for headaches.  Hematological: Negative.   Psychiatric/Behavioral: Negative.     Allergies  Review of patient's allergies indicates no known allergies.  Home Medications   Current Outpatient Rx  Name Route Sig Dispense Refill  . LEVOTHYROXINE SODIUM 175 MCG PO TABS Oral Take 1 tablet (175 mcg total) by mouth daily. 30 tablet 1  . LOVASTATIN 20 MG PO TABS Oral Take 1 tablet (20 mg total) by mouth every evening. 30 tablet 2  . RANITIDINE HCL 75 MG PO TABS Oral Take 75 mg by mouth 2 (two) times daily.        BP 163/86  Pulse 74  Temp(Src) 99.1 F (37.3 C) (Oral)  Resp 20  SpO2 99%  Physical Exam  Constitutional: He is oriented to person, place, and time. He appears well-developed and well-nourished. No distress.  HENT:  Head: Normocephalic and atraumatic.  Mouth/Throat: Oropharynx is clear and moist. No oropharyngeal exudate.  Eyes: EOM are normal. Pupils are equal, round, and reactive to light.  Neck: Normal range of motion. Neck supple.  Cardiovascular: Normal rate and regular rhythm.   Pulmonary/Chest: Effort normal and breath sounds normal. He has no wheezes. He has no rales.  Abdominal: Soft. Bowel sounds are normal. There is no tenderness. There is no rebound and no guarding.  Musculoskeletal: Normal range of motion.  Lymphadenopathy:    He has no cervical adenopathy.  Neurological: He is alert and oriented to person, place, and  time.  Skin: Skin is warm and dry.  Psychiatric: Thought content normal.    ED Course  Procedures (including critical care time)   Labs Reviewed  CBC  DIFFERENTIAL  I-STAT, CHEM 8  LIPASE, BLOOD  HEPATIC FUNCTION PANEL   No results found.   No diagnosis found.    MDM  Return for intractable nausea vomiting inability to tolerate orals.  Abdominal pain fevers or any concerns.  Patient verbalizes understanding and agrees to follow up        Helane Briceno Smitty Cords, MD 10/25/11 6045

## 2011-11-05 ENCOUNTER — Emergency Department: Payer: Self-pay | Admitting: Emergency Medicine

## 2011-11-13 ENCOUNTER — Telehealth: Payer: Self-pay | Admitting: Internal Medicine

## 2011-11-13 MED ORDER — LEVOTHYROXINE SODIUM 175 MCG PO TABS: 175.0000 ug | ORAL_TABLET | Freq: Every day | ORAL | Status: DC

## 2011-11-13 NOTE — Telephone Encounter (Signed)
Refill- levothyroxin tab. Take one tablet by mouth every day. Qty 30 last fill 11.27.12

## 2011-11-13 NOTE — Telephone Encounter (Signed)
rx sent in electronically 

## 2011-11-22 ENCOUNTER — Ambulatory Visit: Payer: Self-pay | Admitting: Internal Medicine

## 2011-11-29 ENCOUNTER — Ambulatory Visit: Payer: Self-pay | Admitting: Internal Medicine

## 2011-12-06 ENCOUNTER — Ambulatory Visit (INDEPENDENT_AMBULATORY_CARE_PROVIDER_SITE_OTHER): Payer: Self-pay | Admitting: Internal Medicine

## 2011-12-06 ENCOUNTER — Encounter: Payer: Self-pay | Admitting: Internal Medicine

## 2011-12-06 VITALS — BP 154/90 | HR 76 | Temp 98.1°F | Wt 195.0 lb

## 2011-12-06 DIAGNOSIS — G5600 Carpal tunnel syndrome, unspecified upper limb: Secondary | ICD-10-CM

## 2011-12-06 DIAGNOSIS — I10 Essential (primary) hypertension: Secondary | ICD-10-CM

## 2011-12-06 MED ORDER — PRAVASTATIN SODIUM 40 MG PO TABS: 40.0000 mg | ORAL_TABLET | Freq: Every day | ORAL | Status: DC

## 2011-12-06 MED ORDER — LISINOPRIL 5 MG PO TABS: 5.0000 mg | ORAL_TABLET | Freq: Every day | ORAL | Status: DC

## 2011-12-06 NOTE — Assessment & Plan Note (Signed)
Start low dose ACE.  We discussed potential side effects.

## 2011-12-06 NOTE — Assessment & Plan Note (Signed)
Due to financial reasons, patient unable to pursue surgical treatment for right CTS.   I recommended continued use of wrist splint and trial of stretching exercises / PT.

## 2011-12-06 NOTE — Progress Notes (Signed)
Subjective:    Patient ID: Cory [name], male    DOB: [birth year], 46 y.o.   MRN: [id number]  HPI  46 year old African American male with history of hypothyroidism and chronic right carpal tunnel syndrome symptoms for followup. Patient continues to have numbness and tingling in his right hand. He works at a [employer]. He has not tried using wrist splint or any physical therapy for his symptoms.  Patient has history of elevated blood pressure readings without diagnosis of hypertension. He does not monitor his blood pressure at home and his reading today is sporadically elevated. He is asymptomatic.  He is still unable to discontinue smoking.  Reviewed GI consultation regarding rectal bleeding. Patient reports symptoms have improved however he understands the risk of not having a colonoscopy. Review of Systems  Negative for significant weight change  Past Medical History  Diagnosis Date  . Hypothyroidism   . Hyperlipidemia   . Tobacco abuse   . Mild stage glaucoma     Dr Mitzi Davenport  . Hypertension   . GERD (gastroesophageal reflux disease)   . Hyperglycemia 04/14/2011  . ED (erectile dysfunction)   . Cholelithiasis   . Carpal tunnel syndrome of right wrist   . Condyloma     History   Social History  . Marital Status: Married    Spouse Name: N/A    Number of Children: N/A  . Years of Education: N/A   Occupational History  . Not on file.   Social History Main Topics  . Smoking status: Current Everyday Smoker -- 0.5 packs/day    Types: Cigarettes  . Smokeless tobacco: Never Used   Comment: "light" smoker since age 6  . Alcohol Use: Yes     22 ounces daily  . Drug Use: Yes    Special: Marijuana     OCCASIONAL  . Sexually Active: Not on file   Other Topics Concern  . Not on file   Social History Narrative   [place]occupation :  [employer] [place] [employer], [employer],  [employer] Current Smoker  - "light" smoker since age 65 Alcohol use-no      No past surgical history on file.  Family History  Problem Relation Age of Onset  . Breast cancer Mother     deceased at age 58 secondary to complications of breast cancer  . Diabetes Father   . Stroke Father   . Glaucoma Father   . Hypertension Father   . Blindness Father     leagally blind  . Diabetes Brother   . Other Neg Hx     prostate cancer, colon cancer  . Alcohol abuse Brother   . Seizures Brother   . Heart disease Father     No Known Allergies  Current Outpatient Prescriptions on File Prior to Visit  Medication Sig Dispense Refill  . levothyroxine (SYNTHROID, LEVOTHROID) 175 MCG tablet Take 1 tablet (175 mcg total) by mouth daily.  30 tablet  0    BP 154/90  Pulse 76  Temp(Src) 98.1 F (36.7 C) (Oral)  Wt 195 lb (88.451 kg)       Objective:   Physical Exam  Constitutional: He appears well-developed and well-nourished.  Cardiovascular: Normal rate, regular rhythm and normal heart sounds.   Pulmonary/Chest: Effort normal and breath sounds normal. He has no wheezes. He has no rales.  Musculoskeletal:       Positive phalens test right wrist  Skin: Skin is warm and dry.  Assessment & Plan:

## 2011-12-06 NOTE — Patient Instructions (Signed)
Use wrist splint and perform stretching exercises as directed Monitor your blood pressure at home

## 2011-12-11 ENCOUNTER — Other Ambulatory Visit: Payer: Self-pay | Admitting: Internal Medicine

## 2012-01-13 ENCOUNTER — Encounter: Payer: Self-pay | Admitting: Internal Medicine

## 2012-01-13 ENCOUNTER — Ambulatory Visit (INDEPENDENT_AMBULATORY_CARE_PROVIDER_SITE_OTHER): Payer: Self-pay | Admitting: Internal Medicine

## 2012-01-13 VITALS — BP 118/84 | Temp 98.9°F | Wt 196.0 lb

## 2012-01-13 DIAGNOSIS — R0789 Other chest pain: Secondary | ICD-10-CM | POA: Insufficient documentation

## 2012-01-13 DIAGNOSIS — R071 Chest pain on breathing: Secondary | ICD-10-CM

## 2012-01-13 MED ORDER — HYDROCODONE-ACETAMINOPHEN 5-500 MG PO TABS: 1.0000 | ORAL_TABLET | Freq: Three times a day (TID) | ORAL | Status: AC | PRN

## 2012-01-13 NOTE — Progress Notes (Signed)
Subjective:    Patient ID: Cory [name], male    DOB: 03/31/1966, 46 y.o.   MRN: [id number]  HPI  46 year old African American male complains of various muscular skeletal pains. His symptoms started with right neck pain approximately one week ago which traveled to the middle of his back. His symptoms then involved right side of chest/sternum. He describes an intermittent sharp sensation. His symptoms can be induced with flexion of his back as well as rotation. Severity can be as high as 8/10. His pain is better today after taking a dose of hydrocodone.  Review of Systems Negative for shortness of breath,  Negative for diaphoresis  Past Medical History  Diagnosis Date  . Hypothyroidism   . Hyperlipidemia   . Tobacco abuse   . Mild stage glaucoma     Dr Mitzi Davenport  . Hypertension   . GERD (gastroesophageal reflux disease)   . Hyperglycemia 04/14/2011  . ED (erectile dysfunction)   . Cholelithiasis   . Carpal tunnel syndrome of right wrist   . Condyloma     History   Social History  . Marital Status: Married    Spouse Name: N/A    Number of Children: N/A  . Years of Education: N/A   Occupational History  . Not on file.   Social History Main Topics  . Smoking status: Current Everyday Smoker -- 0.5 packs/day    Types: Cigarettes  . Smokeless tobacco: Never Used   Comment: "light" smoker since age 26  . Alcohol Use: Yes     22 ounces daily  . Drug Use: Yes    Special: Marijuana     OCCASIONAL  . Sexually Active: Not on file   Other Topics Concern  . Not on file   Social History Narrative   Marriedoccupation :  [employer] [place] [employer], [employer],  [employer] Current Smoker  - "light" smoker since age 56 Alcohol use-no     No past surgical history on file.  Family History  Problem Relation Age of Onset  . Breast cancer Mother     deceased at age 48 secondary to complications of breast cancer  . Diabetes Father   . Stroke Father   . Glaucoma Father    . Hypertension Father   . Blindness Father     leagally blind  . Diabetes Brother   . Other Neg Hx     prostate cancer, colon cancer  . Alcohol abuse Brother   . Seizures Brother   . Heart disease Father     No Known Allergies  Current Outpatient Prescriptions on File Prior to Visit  Medication Sig Dispense Refill  . levothyroxine (SYNTHROID, LEVOTHROID) 175 MCG tablet TAKE ONE TABLET BY MOUTH EVERY DAY  30 tablet  5  . lisinopril (PRINIVIL,ZESTRIL) 5 MG tablet Take 1 tablet (5 mg total) by mouth daily.  30 tablet  2  . pravastatin (PRAVACHOL) 40 MG tablet Take 1 tablet (40 mg total) by mouth daily.  90 tablet  1    BP 118/84  Temp(Src) 98.9 F (37.2 C) (Oral)  Wt 196 lb (88.905 kg)       Objective:   Physical Exam  Constitutional: He appears well-developed and well-nourished.  HENT:  Head: Normocephalic and atraumatic.  Cardiovascular: Normal rate, regular rhythm and normal heart sounds.  Exam reveals no gallop and no friction rub.   No murmur heard. Pulmonary/Chest: Effort normal and breath sounds normal. He has no wheezes. He has no rales.  Mild left sternal tenderness,  pain with thoracic rotation and side bending  Musculoskeletal: He exhibits no edema.  Neurological: He is alert.  Skin: Skin is warm and dry.  Psychiatric: His behavior is normal.          Assessment & Plan:

## 2012-01-13 NOTE — Assessment & Plan Note (Signed)
46 y/o AA male with costochondral chest pain. His symptoms clearly elicited with movement and palpation.  I advised rest and use of pain medication as needed.  Patient advised to call office if symptoms persist or worsen.

## 2012-01-13 NOTE — Patient Instructions (Signed)
Please call our office if your symptoms do not improve or gets worse.  

## 2012-03-17 ENCOUNTER — Other Ambulatory Visit: Payer: Self-pay | Admitting: *Deleted

## 2012-03-17 MED ORDER — LISINOPRIL 5 MG PO TABS: 5.0000 mg | ORAL_TABLET | Freq: Every day | ORAL | Status: DC

## 2012-05-25 ENCOUNTER — Ambulatory Visit: Payer: Self-pay | Admitting: Internal Medicine

## 2012-07-15 ENCOUNTER — Other Ambulatory Visit: Payer: Self-pay | Admitting: *Deleted

## 2012-07-15 MED ORDER — LEVOTHYROXINE SODIUM 175 MCG PO TABS: 175.0000 ug | ORAL_TABLET | Freq: Every day | ORAL | Status: DC

## 2012-07-15 MED ORDER — PRAVASTATIN SODIUM 40 MG PO TABS: 40.0000 mg | ORAL_TABLET | Freq: Every day | ORAL | Status: DC

## 2012-11-25 ENCOUNTER — Encounter: Payer: Self-pay | Admitting: Internal Medicine

## 2012-11-25 ENCOUNTER — Ambulatory Visit (INDEPENDENT_AMBULATORY_CARE_PROVIDER_SITE_OTHER): Payer: Self-pay | Admitting: Internal Medicine

## 2012-11-25 VITALS — BP 102/70 | HR 78 | Temp 98.1°F | Wt 182.0 lb

## 2012-11-25 DIAGNOSIS — E039 Hypothyroidism, unspecified: Secondary | ICD-10-CM

## 2012-11-25 DIAGNOSIS — J111 Influenza due to unidentified influenza virus with other respiratory manifestations: Secondary | ICD-10-CM

## 2012-11-25 DIAGNOSIS — R509 Fever, unspecified: Secondary | ICD-10-CM

## 2012-11-25 DIAGNOSIS — I10 Essential (primary) hypertension: Secondary | ICD-10-CM

## 2012-11-25 LAB — POCT INFLUENZA A/B
Influenza A, POC: POSITIVE
Influenza B, POC: POSITIVE

## 2012-11-25 LAB — BASIC METABOLIC PANEL
Calcium: 9.1 mg/dL (ref 8.4–10.5)
GFR: 76.89 mL/min (ref >60.00)
Sodium: 134 mEq/L — ABNORMAL LOW (ref 135–145)

## 2012-11-25 MED ORDER — OSELTAMIVIR PHOSPHATE 75 MG PO CAPS: 75.0000 mg | ORAL_CAPSULE | Freq: Two times a day (BID) | ORAL | Status: DC

## 2012-11-25 MED ORDER — PRAVASTATIN SODIUM 40 MG PO TABS: 40.0000 mg | ORAL_TABLET | Freq: Every day | ORAL | Status: DC

## 2012-11-25 MED ORDER — LISINOPRIL 5 MG PO TABS: 5.0000 mg | ORAL_TABLET | Freq: Every day | ORAL | Status: DC

## 2012-11-25 NOTE — Assessment & Plan Note (Signed)
Stable.  Monitor electrolytes and kidney function. BP: 102/70 mmHg

## 2012-11-25 NOTE — Assessment & Plan Note (Signed)
Monitor TFTs.  Adjust levothyroxine dose accordingly. 

## 2012-11-25 NOTE — Assessment & Plan Note (Signed)
47 year old [place] American male with acute influenza. Onset of symptoms last 48 hours. Start Tamiflu 75 mg twice daily.

## 2012-11-25 NOTE — Patient Instructions (Signed)
Please call our office if your symptoms do not improve or gets worse.  

## 2012-11-25 NOTE — Progress Notes (Signed)
Subjective:    Patient ID: Cory [name], male    DOB: 1966/05/30, 47 y.o.   MRN: [id number]  HPI  47 year old African American male with history of hypertension, hypothyroidism hyperlipidemia complains of cough chest congestion and fever for last 2 days. Patient feels achy over. His sons have similar illness. Cough is nonproductive.  Patient reports he quit smoking 2 days ago.  Review of Systems Negative for shortness of breath  Past Medical History  Diagnosis Date  . Hypothyroidism   . Hyperlipidemia   . Tobacco abuse   . Mild stage glaucoma     Dr Mitzi Davenport  . Hypertension   . GERD (gastroesophageal reflux disease)   . Hyperglycemia 04/14/2011  . ED (erectile dysfunction)   . Cholelithiasis   . Carpal tunnel syndrome of right wrist   . Condyloma     History   Social History  . Marital Status: Married    Spouse Name: N/A    Number of Children: N/A  . Years of Education: N/A   Occupational History  . Not on file.   Social History Main Topics  . Smoking status: Former Smoker -- 0.5 packs/day    Types: Cigarettes    Quit date: 11/23/2012  . Smokeless tobacco: Never Used     Comment: "light" smoker since age 53  . Alcohol Use: Yes     Comment: 22 ounces daily  . Drug Use: Yes    Special: Marijuana     Comment: OCCASIONAL  . Sexually Active: Not on file   Other Topics Concern  . Not on file   Social History Narrative   Marriedoccupation :  [employer] [place] [employer], [employer],  [employer] Current Smoker  - "light" smoker since age 82 Alcohol use-no     No past surgical history on file.  Family History  Problem Relation Age of Onset  . Breast cancer Mother     deceased at age 88 secondary to complications of breast cancer  . Diabetes Father   . Stroke Father   . Glaucoma Father   . Hypertension Father   . Blindness Father     leagally blind  . Diabetes Brother   . Other Neg Hx     prostate cancer, colon cancer  . Alcohol abuse Brother     . Seizures Brother   . Heart disease Father     No Known Allergies  Current Outpatient Prescriptions on File Prior to Visit  Medication Sig Dispense Refill  . levothyroxine (SYNTHROID, LEVOTHROID) 175 MCG tablet Take 1 tablet (175 mcg total) by mouth daily.  90 tablet  1  . lisinopril (PRINIVIL,ZESTRIL) 5 MG tablet Take 1 tablet (5 mg total) by mouth daily.  30 tablet  3  . pravastatin (PRAVACHOL) 40 MG tablet Take 1 tablet (40 mg total) by mouth daily.  90 tablet  1    BP 102/70  Pulse 78  Temp 98.1 F (36.7 C) (Oral)  Wt 182 lb (82.555 kg)  SpO2 97%       Objective:   Physical Exam  Constitutional: He appears well-developed and well-nourished.  HENT:  Head: Normocephalic and atraumatic.  Right Ear: External ear normal.  Left Ear: External ear normal.  Mouth/Throat: No oropharyngeal exudate.       Oropharyngeal erythema  Cardiovascular: Normal rate, regular rhythm and normal heart sounds.   Pulmonary/Chest: Effort normal and breath sounds normal. He has no wheezes.          Assessment &  Plan:

## 2012-11-26 ENCOUNTER — Other Ambulatory Visit: Payer: Self-pay | Admitting: Internal Medicine

## 2012-11-26 ENCOUNTER — Emergency Department (HOSPITAL_COMMUNITY)
Admission: EM | Admit: 2012-11-26 | Discharge: 2012-11-27 | Disposition: A | Discharge status: Home / Self Care | Payer: Self-pay | Attending: Emergency Medicine | Admitting: Emergency Medicine

## 2012-11-26 ENCOUNTER — Encounter (HOSPITAL_COMMUNITY): Payer: Self-pay | Admitting: *Deleted

## 2012-11-26 DIAGNOSIS — I10 Essential (primary) hypertension: Secondary | ICD-10-CM | POA: Insufficient documentation

## 2012-11-26 DIAGNOSIS — Z79899 Other long term (current) drug therapy: Secondary | ICD-10-CM | POA: Insufficient documentation

## 2012-11-26 DIAGNOSIS — Z87448 Personal history of other diseases of urinary system: Secondary | ICD-10-CM | POA: Insufficient documentation

## 2012-11-26 DIAGNOSIS — N489 Disorder of penis, unspecified: Secondary | ICD-10-CM | POA: Insufficient documentation

## 2012-11-26 DIAGNOSIS — H40009 Preglaucoma, unspecified, unspecified eye: Secondary | ICD-10-CM | POA: Insufficient documentation

## 2012-11-26 DIAGNOSIS — Z8619 Personal history of other infectious and parasitic diseases: Secondary | ICD-10-CM | POA: Insufficient documentation

## 2012-11-26 DIAGNOSIS — N4889 Other specified disorders of penis: Secondary | ICD-10-CM | POA: Insufficient documentation

## 2012-11-26 DIAGNOSIS — E039 Hypothyroidism, unspecified: Secondary | ICD-10-CM | POA: Insufficient documentation

## 2012-11-26 DIAGNOSIS — E785 Hyperlipidemia, unspecified: Secondary | ICD-10-CM | POA: Insufficient documentation

## 2012-11-26 DIAGNOSIS — Z8669 Personal history of other diseases of the nervous system and sense organs: Secondary | ICD-10-CM | POA: Insufficient documentation

## 2012-11-26 DIAGNOSIS — N477 Other inflammatory diseases of prepuce: Secondary | ICD-10-CM

## 2012-11-26 DIAGNOSIS — N476 Balanoposthitis: Secondary | ICD-10-CM | POA: Insufficient documentation

## 2012-11-26 DIAGNOSIS — Z87891 Personal history of nicotine dependence: Secondary | ICD-10-CM | POA: Insufficient documentation

## 2012-11-26 DIAGNOSIS — Z87442 Personal history of urinary calculi: Secondary | ICD-10-CM | POA: Insufficient documentation

## 2012-11-26 DIAGNOSIS — Z8719 Personal history of other diseases of the digestive system: Secondary | ICD-10-CM | POA: Insufficient documentation

## 2012-11-26 LAB — URINALYSIS, ROUTINE W REFLEX MICROSCOPIC
Bilirubin Urine: NEGATIVE
Ketones, ur: NEGATIVE mg/dL
Nitrite: NEGATIVE
Urobilinogen, UA: 1 mg/dL (ref 0.0–1.0)
pH: 5.5 (ref 5.0–8.0)

## 2012-11-26 LAB — URINE MICROSCOPIC-ADD ON

## 2012-11-26 MED ORDER — LEVOTHYROXINE SODIUM 150 MCG PO TABS: 150.0000 ug | ORAL_TABLET | Freq: Every day | ORAL | Status: DC

## 2012-11-26 NOTE — ED Notes (Signed)
The pt is complaining that his penis is starting to cave in today with some sl pain.  No diffiuclty  Voiding.  He was diagnosed with the flu by his doctor yesterday

## 2012-11-26 NOTE — ED Provider Notes (Signed)
History   This chart was scribed for non-physician practitioner working with Olivia Mackie, MD by Bennett Scrape, ED Scribe. This patient was seen in room TR10C/TR10C and the patient's care was started at 23:30.  CSN: [id number]  Arrival date & time 11/26/12  2002   First MD Initiated Contact with Patient 11/26/12 2323      Chief Complaint  Patient presents with  . Groin Swelling    HPI  Cory [name] is a 46 y.o. male with a h/o HTN, who presents to the Emergency Department complaining of 12 hours new, sudden onset, constant, unchanged penile swelling after beginning new medications. Pain is mild, with no aggravators or alleviators. Typically healthy, CC represents a moderate deviation from baseline health. Denies new sexual partners. Pt states that he was Dx with the Flu earlier this week, and CC began after taking TamiFlu  Symptoms have not been treated PTA. No discharge, groin swelling, or dysuria. Pt is a former smoke, admitting alcohol and marijuana use. No pertinent surgical Hx denoted.    Past Medical History  Diagnosis Date  . Hypothyroidism   . Hyperlipidemia   . Tobacco abuse   . Mild stage glaucoma     Dr Mitzi Davenport  . Hypertension   . GERD (gastroesophageal reflux disease)   . Hyperglycemia 04/14/2011  . ED (erectile dysfunction)   . Cholelithiasis   . Carpal tunnel syndrome of right wrist   . Condyloma     History reviewed. No pertinent past surgical history.  Family History  Problem Relation Age of Onset  . Breast cancer Mother     deceased at age 22 secondary to complications of breast cancer  . Diabetes Father   . Stroke Father   . Glaucoma Father   . Hypertension Father   . Blindness Father     leagally blind  . Diabetes Brother   . Other Neg Hx     prostate cancer, colon cancer  . Alcohol abuse Brother   . Seizures Brother   . Heart disease Father     History  Substance Use Topics  . Smoking status: Former Smoker -- 0.5 packs/day   Types: Cigarettes    Quit date: 11/23/2012  . Smokeless tobacco: Never Used     Comment: "light" smoker since age 6  . Alcohol Use: Yes     Comment: 22 ounces daily    Review of Systems  Genitourinary: Positive for penile swelling and penile pain. Negative for dysuria, scrotal swelling, genital sores and testicular pain.  All other systems reviewed and are negative.    Allergies  Review of patient's allergies indicates no known allergies.  Home Medications   Current Outpatient Rx  Name  Route  Sig  Dispense  Refill  . LEVOTHYROXINE SODIUM 175 MCG PO TABS   Oral   Take 175 mcg by mouth daily.         Marland Kitchen LISINOPRIL 5 MG PO TABS   Oral   Take 5 mg by mouth daily.         . OSELTAMIVIR PHOSPHATE 75 MG PO CAPS   Oral   Take 75 mg by mouth 2 (two) times daily.         Marland Kitchen PRAVASTATIN SODIUM 40 MG PO TABS   Oral   Take 40 mg by mouth daily.         Marland Kitchen PRESCRIPTION MEDICATION   Oral   Take 10 mLs by mouth 2 (two) times daily.         Marland Kitchen  PRESCRIPTION MEDICATION   Both Eyes   Place 1 drop into both eyes daily.         Marland Kitchen PRESCRIPTION MEDICATION   Both Eyes   Place 1 drop into both eyes every evening.           BP 147/78  Pulse 85  Temp 98.2 F (36.8 C) (Oral)  Resp 18  SpO2 98%  Physical Exam  Nursing note and vitals reviewed. Constitutional: He is oriented to person, place, and time. He appears well-developed and well-nourished. No distress.  HENT:  Head: Normocephalic and atraumatic.  Eyes: Conjunctivae normal and EOM are normal. Pupils are equal, round, and reactive to light.  Neck: Neck supple. No tracheal deviation present.       No nuchal rigidity no meningeal signs  Cardiovascular: Normal rate and regular rhythm.   Pulmonary/Chest: Effort normal and breath sounds normal. No respiratory distress.  Abdominal: Soft. He exhibits no distension.  Genitourinary:       Swollen penis.  Musculoskeletal: Normal range of motion. He exhibits no edema  and no tenderness.  Neurological: He is alert and oriented to person, place, and time. He has normal reflexes. No sensory deficit.  Skin: Skin is warm and dry. He is not diaphoretic.       No pettechia no purpura  Psychiatric: He has a normal mood and affect. His behavior is normal.    ED Course  Procedures DIAGNOSTIC STUDIES: Oxygen Saturation is 98% on room air, normal by my interpretation.    COORDINATION OF CARE: 20:14- Ordered Urinalysis, Routine w reflex microscopic Once. 23:30- Evaluated Pt. Pt is awake, alert, and without distress. 23:34- Patient understand and agree with initial ED impression and plan with expectations set for ED visit.     Labs Reviewed  URINALYSIS, ROUTINE W REFLEX MICROSCOPIC - Abnormal; Notable for the following:    Specific Gravity, Urine 1.033 (*)     Glucose, UA 250 (*)     Hgb urine dipstick TRACE (*)     All other components within normal limits  URINE MICROSCOPIC-ADD ON - Abnormal; Notable for the following:    Casts HYALINE CASTS (*)     All other components within normal limits     1. Posthitis       MDM  A 47 year old male with posthitis. Able to retract foreskin, doubt phimosis or paraphimosis. This patient has been seen by and discussed with Dr. Norlene Campbell, who agrees with the plan. Will give the patient clotrimazole cream and urology followup. Patient understands and agrees with the plan. He is stable and ready for discharge.      I personally performed the services described in this documentation, which was scribed in my presence. The recorded information has been reviewed and is accurate.     Roxy Horseman, PA-C 11/27/12 0003

## 2012-11-26 NOTE — ED Notes (Signed)
Pt states "something is wrong with my wiener, it's swollen and caving in." Pt penis appears swollen, tip of penis has retracted in. Pt uncircumcised. States he has not been sexually active this month but has been "very active" before this month.

## 2012-11-27 MED ORDER — CLOTRIMAZOLE 1 % EX CREA: TOPICAL_CREAM | CUTANEOUS | Status: DC

## 2012-11-27 NOTE — ED Provider Notes (Signed)
Medical screening examination/treatment/procedure(s) were conducted as a shared visit with non-physician practitioner(s) and myself.  I personally evaluated the patient during the encounter. Patient seen with PA. He has had one day of swelling of his penis and foreskin. No acute lesion seen, he does have some soft tissue swelling/edema of his foreskin. Concern for posthitis.  Will start on antifungal and have him follow up with urology/primary care Dr.  Olivia Mackie, MD 11/27/12 [phone]

## 2012-11-27 NOTE — ED Notes (Signed)
Pt stated understanding of discharge instructions.

## 2012-11-30 ENCOUNTER — Other Ambulatory Visit: Payer: Self-pay | Admitting: *Deleted

## 2012-11-30 MED ORDER — LEVOTHYROXINE SODIUM 150 MCG PO TABS: 150.0000 ug | ORAL_TABLET | Freq: Every day | ORAL | Status: DC

## 2013-01-28 ENCOUNTER — Other Ambulatory Visit: Payer: Self-pay | Admitting: *Deleted

## 2013-01-28 MED ORDER — LISINOPRIL 5 MG PO TABS: 5.0000 mg | ORAL_TABLET | Freq: Every day | ORAL | Status: DC

## 2013-02-23 ENCOUNTER — Telehealth: Payer: Self-pay | Admitting: Internal Medicine

## 2013-02-23 NOTE — Telephone Encounter (Signed)
Pt would like to add sexually transmitted diseases and an AIDS test to his cpx labs on 03/12/13.

## 2013-02-25 NOTE — Telephone Encounter (Signed)
HIV test added.  Pt is not having symptoms and will discuss at office visit.  He was just wanting to check cause he has 2 partners

## 2013-02-25 NOTE — Telephone Encounter (Signed)
We can add HIV antibody test.  Other tests should be based upon symptoms?  Is he having any symptoms?  Is he worried because he has new sexual partner that is "high risk"?

## 2013-02-25 NOTE — Telephone Encounter (Signed)
Left message for pt to call back  °

## 2013-03-12 ENCOUNTER — Other Ambulatory Visit: Payer: Self-pay

## 2013-03-16 ENCOUNTER — Other Ambulatory Visit (INDEPENDENT_AMBULATORY_CARE_PROVIDER_SITE_OTHER): Payer: BC Managed Care – PPO

## 2013-03-16 DIAGNOSIS — Z0000 Encounter for general adult medical examination without abnormal findings: Secondary | ICD-10-CM

## 2013-03-16 LAB — POCT URINALYSIS DIPSTICK
Blood, UA: NEGATIVE
Nitrite, UA: NEGATIVE
Spec Grav, UA: >=1.03
Urobilinogen, UA: 1
pH, UA: 5.5

## 2013-03-16 LAB — CBC WITH DIFFERENTIAL/PLATELET
Basophils Relative: 0.5 % (ref 0.0–3.0)
Eosinophils Relative: 1.9 % (ref 0.0–5.0)
HCT: 43.6 % (ref 39.0–52.0)
Hemoglobin: 15.1 g/dL (ref 13.0–17.0)
Lymphs Abs: 2 10*3/uL (ref 0.7–4.0)
MCV: 84.7 fl (ref 78.0–100.0)
Monocytes Absolute: 0.6 10*3/uL (ref 0.1–1.0)
Monocytes Relative: 8.3 % (ref 3.0–12.0)
Neutro Abs: 4.1 10*3/uL (ref 1.4–7.7)
Platelets: 230 10*3/uL (ref 150.0–400.0)
WBC: 6.9 10*3/uL (ref 4.5–10.5)

## 2013-03-16 LAB — LIPID PANEL: VLDL: 13.2 mg/dL (ref 0.0–40.0)

## 2013-03-16 LAB — TSH: TSH: 0.08 u[IU]/mL — ABNORMAL LOW (ref 0.35–5.50)

## 2013-03-16 LAB — BASIC METABOLIC PANEL
BUN: 13 mg/dL (ref 6–23)
Chloride: 105 mEq/L (ref 96–112)
Potassium: 4.7 mEq/L (ref 3.5–5.1)
Sodium: 137 mEq/L (ref 135–145)

## 2013-03-16 LAB — PSA: PSA: 1.58 ng/mL (ref 0.10–4.00)

## 2013-03-16 LAB — HEPATIC FUNCTION PANEL
ALT: 32 U/L (ref 0–53)
AST: 23 U/L (ref 0–37)
Albumin: 3.8 g/dL (ref 3.5–5.2)
Total Protein: 7.4 g/dL (ref 6.0–8.3)

## 2013-03-19 ENCOUNTER — Ambulatory Visit (INDEPENDENT_AMBULATORY_CARE_PROVIDER_SITE_OTHER): Payer: BC Managed Care – PPO | Admitting: Internal Medicine

## 2013-03-19 ENCOUNTER — Encounter: Payer: Self-pay | Admitting: Internal Medicine

## 2013-03-19 VITALS — BP 124/80 | HR 72 | Temp 98.5°F | Ht 66.75 in | Wt 184.0 lb

## 2013-03-19 DIAGNOSIS — Z0000 Encounter for general adult medical examination without abnormal findings: Secondary | ICD-10-CM

## 2013-03-19 DIAGNOSIS — I10 Essential (primary) hypertension: Secondary | ICD-10-CM

## 2013-03-19 DIAGNOSIS — Z0001 Encounter for general adult medical examination with abnormal findings: Secondary | ICD-10-CM | POA: Insufficient documentation

## 2013-03-19 DIAGNOSIS — E039 Hypothyroidism, unspecified: Secondary | ICD-10-CM

## 2013-03-19 MED ORDER — PRAVASTATIN SODIUM 40 MG PO TABS: 40.0000 mg | ORAL_TABLET | Freq: Every day | ORAL | Status: DC

## 2013-03-19 MED ORDER — LEVOTHYROXINE SODIUM 125 MCG PO TABS: 125.0000 ug | ORAL_TABLET | Freq: Every day | ORAL | Status: DC

## 2013-03-19 MED ORDER — VARENICLINE TARTRATE 1 MG PO TABS: 1.0000 mg | ORAL_TABLET | Freq: Two times a day (BID) | ORAL | Status: DC

## 2013-03-19 MED ORDER — LISINOPRIL 5 MG PO TABS: 5.0000 mg | ORAL_TABLET | Freq: Every day | ORAL | Status: DC

## 2013-03-19 MED ORDER — VARENICLINE TARTRATE 0.5 MG X 11 & 1 MG X 42 PO MISC: ORAL | Status: DC

## 2013-03-19 NOTE — Assessment & Plan Note (Signed)
Blood pressures sporadically elevated today. Continue same dose of lisinopril 5 mg once daily. Patient advised to monitor blood pressure at home. If persistently elevated readings, patient to return 1-2 months.

## 2013-03-19 NOTE — Patient Instructions (Addendum)
Please complete the following lab tests before your next follow up appointment: TSH - 244.9 

## 2013-03-19 NOTE — Assessment & Plan Note (Signed)
TSH is suppressed. Reduce levothyroxine dose to 125 mcg. Repeat TSH in 2 months. Lab Results  Component Value Date   TSH 0.08* 03/16/2013

## 2013-03-19 NOTE — Progress Notes (Signed)
Subjective:    Patient ID: Cory [name], male    DOB: 24-Apr-1966, 47 y.o.   MRN: [id number]  HPI  47 year old African American male with history of hypertension, hypothyroidism and tobacco use for routine physical. He denies any significant interval medical history. Unfortunately, patient still smoking. (Half pack to one pack per day)  Patient starting new job next week.  He will be working for [employer].   Review of Systems  Constitutional: Negative for activity change, appetite change and unexpected weight change.  Eyes: Negative for visual disturbance.  Respiratory: Negative for cough, chest tightness and shortness of breath.   Cardiovascular: Negative for chest pain.  Genitourinary: Negative for difficulty urinating.  Neurological: Negative for headaches.  Gastrointestinal: Negative for abdominal pain, heartburn melena or hematochezia Psych: Negative for depression or anxiety Endo:  Negative for sexual dysfunction       Past Medical History  Diagnosis Date  . Hypothyroidism   . Hyperlipidemia   . Tobacco abuse   . Mild stage glaucoma     Dr Mitzi Davenport  . Hypertension   . GERD (gastroesophageal reflux disease)   . Hyperglycemia 04/14/2011  . ED (erectile dysfunction)   . Cholelithiasis   . Carpal tunnel syndrome of right wrist   . Condyloma     History   Social History  . Marital Status: Single    Spouse Name: N/A    Number of Children: N/A  . Years of Education: N/A   Occupational History  . Not on file.   Social History Main Topics  . Smoking status: Former Smoker -- 0.50 packs/day    Types: Cigarettes    Quit date: 11/23/2012  . Smokeless tobacco: Never Used     Comment: "light" smoker since age 76  . Alcohol Use: Yes     Comment: 22 ounces daily  . Drug Use: Yes    Special: Marijuana     Comment: OCCASIONAL  . Sexually Active: Not on file   Other Topics Concern  . 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  Family History  Problem Relation Age of Onset  . Breast cancer Mother     deceased at age 19 secondary to complications of breast cancer  . Diabetes Father   . Stroke Father   . Glaucoma Father   . Hypertension Father   . Blindness Father     leagally blind  . Diabetes Brother   . Other Neg Hx     prostate cancer, colon cancer  . Alcohol abuse Brother   . Seizures Brother   . Heart disease Father     No Known Allergies  No current outpatient prescriptions on file prior to visit.   No current facility-administered medications on file prior to visit.    BP 124/80  Pulse 72  Temp(Src) 98.5 F (36.9 C) (Oral)  Ht 5' 6.75" (1.695 m)  Wt 184 lb (83.462 kg)  BMI 29.05 kg/m2    Objective:   Physical Exam  Constitutional: He is oriented to person, place, and time. He appears well-developed and well-nourished. No distress.  HENT:  Head: Normocephalic and atraumatic.  Right Ear: External ear normal.  Left Ear: External ear normal.  Mouth/Throat: Oropharynx is clear and moist.  Eyes: EOM are normal. Pupils are equal, round, and reactive to light.  Neck: Neck supple.  No carotid bruit  Cardiovascular: Normal rate, regular rhythm and normal heart sounds.   No murmur heard. Pulmonary/Chest: Effort normal and breath sounds normal. He has no wheezes.  Abdominal: Bowel sounds are normal. He exhibits no mass. There is no tenderness.  Genitourinary: Rectum normal, prostate normal and penis normal. Guaiac negative stool.  Musculoskeletal: Normal range of motion. He exhibits no edema.  Lymphadenopathy:    He has no cervical adenopathy.  Neurological: He is alert and oriented to person, place, and time. No cranial nerve deficit.  Skin: Skin is warm and dry.  Psychiatric: He has a normal mood and affect. His behavior is normal.           Assessment & Plan:

## 2013-03-19 NOTE — Assessment & Plan Note (Signed)
Reviewed adult health maintenance protocols.  I strongly encouraged tobacco cessation. Restart Chantix.

## 2013-05-13 ENCOUNTER — Other Ambulatory Visit: Payer: BC Managed Care – PPO

## 2013-05-20 ENCOUNTER — Ambulatory Visit: Payer: BC Managed Care – PPO | Admitting: Internal Medicine

## 2013-06-09 ENCOUNTER — Other Ambulatory Visit: Payer: BC Managed Care – PPO

## 2013-06-15 ENCOUNTER — Other Ambulatory Visit: Payer: BC Managed Care – PPO

## 2013-06-17 ENCOUNTER — Ambulatory Visit: Payer: BC Managed Care – PPO | Admitting: Internal Medicine

## 2013-06-28 ENCOUNTER — Other Ambulatory Visit (INDEPENDENT_AMBULATORY_CARE_PROVIDER_SITE_OTHER): Payer: BC Managed Care – PPO

## 2013-06-28 DIAGNOSIS — E039 Hypothyroidism, unspecified: Secondary | ICD-10-CM

## 2013-06-28 LAB — TSH: TSH: 0.16 u[IU]/mL — ABNORMAL LOW (ref 0.35–5.50)

## 2013-06-30 ENCOUNTER — Other Ambulatory Visit: Payer: Self-pay | Admitting: *Deleted

## 2013-06-30 MED ORDER — LEVOTHYROXINE SODIUM 100 MCG PO TABS: 100.0000 ug | ORAL_TABLET | Freq: Every day | ORAL | Status: DC

## 2013-07-09 ENCOUNTER — Ambulatory Visit (INDEPENDENT_AMBULATORY_CARE_PROVIDER_SITE_OTHER): Payer: BC Managed Care – PPO | Admitting: Internal Medicine

## 2013-07-09 ENCOUNTER — Encounter: Payer: Self-pay | Admitting: Internal Medicine

## 2013-07-09 VITALS — BP 124/76 | HR 68 | Temp 97.9°F | Wt 180.0 lb

## 2013-07-09 DIAGNOSIS — K625 Hemorrhage of anus and rectum: Secondary | ICD-10-CM | POA: Insufficient documentation

## 2013-07-09 DIAGNOSIS — Z23 Encounter for immunization: Secondary | ICD-10-CM

## 2013-07-09 DIAGNOSIS — F17200 Nicotine dependence, unspecified, uncomplicated: Secondary | ICD-10-CM

## 2013-07-09 DIAGNOSIS — E039 Hypothyroidism, unspecified: Secondary | ICD-10-CM

## 2013-07-09 MED ORDER — VARENICLINE TARTRATE 1 MG PO TABS: 1.0000 mg | ORAL_TABLET | Freq: Two times a day (BID) | ORAL | Status: DC

## 2013-07-09 MED ORDER — VARENICLINE TARTRATE 0.5 MG X 11 & 1 MG X 42 PO MISC: ORAL | Status: DC

## 2013-07-09 NOTE — Assessment & Plan Note (Signed)
47 year old [place] American male with intermittent rectal bleeding. His symptoms may be secondary to internal hemorrhoids. However given his age I suggest referral to gastroenterology for colonoscopy.  Start psyllium fiber supplement.

## 2013-07-09 NOTE — Patient Instructions (Addendum)
Please complete the following lab tests before your next follow up appointment: TSH - 244.9 BMET - 401.9 Use Metamucil (psyllium fiber) once daily as directed

## 2013-07-09 NOTE — Assessment & Plan Note (Signed)
Trial of Chantix 

## 2013-07-09 NOTE — Progress Notes (Signed)
Subjective:    Patient ID: Cory [name], male    DOB: 1966/08/29, 47 y.o.   MRN: [id number]  HPI  47 year old African American male with history of hypertension, hypothyroidism and tobacco use complains of intermittent rectal bleeding. It has occurred in the past. He has history of internal hemorrhoids. Patient reports his bowel movements are otherwise normal. He denies any rectal pain. He denies any family history of colorectal cancer.  Tobacco use-patient still smoking half pack per day.  Hypothyroidism-his previous TSH was suppressed. His levothyroxine dose decreased to 100 mcg. He started lower dose one week ago.  Review of Systems Negative for weight change  Past Medical History  Diagnosis Date  . Hypothyroidism   . Hyperlipidemia   . Tobacco abuse   . Mild stage glaucoma     Dr Mitzi Davenport  . Hypertension   . GERD (gastroesophageal reflux disease)   . Hyperglycemia 04/14/2011  . ED (erectile dysfunction)   . Cholelithiasis   . Carpal tunnel syndrome of right wrist   . Condyloma     History   Social History  . Marital Status: Single    Spouse Name: N/A    Number of Children: N/A  . Years of Education: N/A   Occupational History  . Not on file.   Social History Main Topics  . Smoking status: Former Smoker -- 0.50 packs/day    Types: Cigarettes    Quit date: 11/23/2012  . Smokeless tobacco: Never Used     Comment: "light" smoker since age 58  . Alcohol Use: Yes     Comment: 22 ounces Cory  . Drug Use: Yes    Special: Marijuana     Comment: OCCASIONAL  . Sexual Activity: Not on file   Other Topics Concern  . 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  Family History  Problem Relation Age of Onset  . Breast cancer Mother     deceased at age 65 secondary to  complications of breast cancer  . Diabetes Father   . Stroke Father   . Glaucoma Father   . Hypertension Father   . Blindness Father     leagally blind  . Diabetes Brother   . Other Neg Hx     prostate cancer, colon cancer  . Alcohol abuse Brother   . Seizures Brother   . Heart disease Father     No Known Allergies  Current Outpatient Prescriptions on File Prior to Visit  Medication Sig Dispense Refill  . bimatoprost (LUMIGAN) 0.03 % ophthalmic solution Place 1 drop into both eyes at bedtime.      Marland Kitchen levothyroxine (SYNTHROID, LEVOTHROID) 100 MCG tablet Take 1 tablet (100 mcg total) by mouth Cory.  90 tablet  1  . lisinopril (PRINIVIL,ZESTRIL) 5 MG tablet Take 1 tablet (5 mg total) by mouth Cory.  90 tablet  1  . pravastatin (PRAVACHOL) 40 MG tablet Take 1 tablet (40 mg total) by mouth Cory.  90 tablet  1   No current facility-administered medications on file prior to visit.    BP 124/76  Pulse 68  Temp(Src) 97.9 F (36.6 C) (Oral)  Wt 180 lb (81.647 kg)  BMI 28.42 kg/m2       Objective:  Physical Exam  Constitutional: He is oriented to person, place, and time. He appears well-developed and well-nourished.  Cardiovascular: Normal rate, regular rhythm and normal heart sounds.   No murmur heard. Pulmonary/Chest: Effort normal and breath sounds normal. He has no wheezes.  Abdominal: Soft. Bowel sounds are normal. There is no tenderness.  Internal hemorrhoids (nontender), no external hemorrhoids, no rectal mass  Neurological: He is alert and oriented to person, place, and time.  Psychiatric: He has a normal mood and affect. His behavior is normal.          Assessment & Plan:

## 2013-07-09 NOTE — Assessment & Plan Note (Addendum)
Patient's levothyroxine dose decreased to 100 mcg. He started lower dose one week ago. Repeat TSH in 2 weeks. Lab Results  Component Value Date   TSH 0.16* 06/28/2013

## 2013-07-26 ENCOUNTER — Encounter: Payer: Self-pay | Admitting: Internal Medicine

## 2013-08-25 ENCOUNTER — Encounter: Payer: Self-pay | Admitting: Internal Medicine

## 2013-08-25 ENCOUNTER — Ambulatory Visit (INDEPENDENT_AMBULATORY_CARE_PROVIDER_SITE_OTHER): Payer: BC Managed Care – PPO | Admitting: Internal Medicine

## 2013-08-25 VITALS — BP 124/80 | HR 60 | Ht 66.0 in | Wt 179.2 lb

## 2013-08-25 DIAGNOSIS — K648 Other hemorrhoids: Secondary | ICD-10-CM

## 2013-08-25 DIAGNOSIS — Z1211 Encounter for screening for malignant neoplasm of colon: Secondary | ICD-10-CM

## 2013-08-25 MED ORDER — HYDROCORTISONE ACETATE 25 MG RE SUPP: 25.0000 mg | Freq: Every day | RECTAL | Status: DC

## 2013-08-25 MED ORDER — NA SULFATE-K SULFATE-MG SULF 17.5-3.13-1.6 GM/177ML PO SOLN: ORAL | Status: DC

## 2013-08-25 NOTE — Progress Notes (Signed)
  Subjective:    Patient ID: Cory [name], male    DOB: October 24, 1966, 47 y.o.   MRN: [id number]  HPI The patient was seen in 2012 and diagnosed with bleeding hemorrhoids. He was treated with topical hydrocortisone and improved. Since then he has developed recurrent rectal bleeding. Has some straining in the AM, less when he takes fiber supplement as recommended by Dr. Artist Pais. He also notices anal protrusiion with defecation that spontaneously reduces.  Medications, allergies, past medical history, past surgical history, family history and social history are reviewed and updated in the EMR.   Review of Systems As per HPI    Objective:   Physical Exam General:  NAD Lungs:  clear Heart:  S1S2 no rubs, murmurs or gallops Abdomen:  soft and nontender, BS+ Rectal: No mass, palpable hemorrhoids in canal, brown stool, prostate NL Data Reviewed:  2012 GI note, anoscopy    Assessment & Plan:  Hemorrhoids, internal, with bleeding, and prolapse  Special screening for malignant neoplasms, colon

## 2013-08-25 NOTE — Patient Instructions (Addendum)
You have been given a separate informational sheet regarding your tobacco use, the importance of quitting and local resources to help you quit.  You have been scheduled for a colonoscopy with propofol. Please follow written instructions given to you at your visit today.  Please pick up your prep kit at the pharmacy within the next 1-3 days. If you use inhalers (even only as needed), please bring them with you on the day of your procedure. Your physician has requested that you go to www.startemmi.com and enter the access code given to you at your visit today. This web site gives a general overview about your procedure. However, you should still follow specific instructions given to you by our office regarding your preparation for the procedure.  Take your fiber supplement.  We have sent the following medications to your pharmacy for you to pick up at your convenience: Rectal suppositories   We are giving you a handout to read on hemorrhoid banding that we do here in the office.    It is the time of year to have a vaccination to prevent the flu (influenza virus).  Please have this done through your primary care provider or you can get this done at local pharmacies or the [facility]. It would be very helpful if you notify your primary care provider when and where you had the vaccination given by messaging them in My Chart, leaving a message or faxing the information.  I appreciate the opportunity to care for you.

## 2013-08-25 NOTE — Assessment & Plan Note (Signed)
HC cream or suppositories then consider ligation Needs screening colonoscopy

## 2013-09-07 ENCOUNTER — Encounter: Payer: BC Managed Care – PPO | Admitting: Internal Medicine

## 2013-09-08 ENCOUNTER — Other Ambulatory Visit: Payer: BC Managed Care – PPO

## 2013-09-09 ENCOUNTER — Ambulatory Visit (AMBULATORY_SURGERY_CENTER): Payer: BC Managed Care – PPO | Admitting: Internal Medicine

## 2013-09-09 ENCOUNTER — Encounter: Payer: Self-pay | Admitting: Internal Medicine

## 2013-09-09 VITALS — BP 141/80 | HR 55 | Temp 97.5°F | Resp 20 | Ht 66.0 in | Wt 179.0 lb

## 2013-09-09 DIAGNOSIS — K5790 Diverticulosis of intestine, part unspecified, without perforation or abscess without bleeding: Secondary | ICD-10-CM

## 2013-09-09 DIAGNOSIS — K639 Disease of intestine, unspecified: Secondary | ICD-10-CM

## 2013-09-09 DIAGNOSIS — Z1211 Encounter for screening for malignant neoplasm of colon: Secondary | ICD-10-CM

## 2013-09-09 DIAGNOSIS — K5730 Diverticulosis of large intestine without perforation or abscess without bleeding: Secondary | ICD-10-CM

## 2013-09-09 DIAGNOSIS — D126 Benign neoplasm of colon, unspecified: Secondary | ICD-10-CM

## 2013-09-09 DIAGNOSIS — K635 Polyp of colon: Secondary | ICD-10-CM

## 2013-09-09 DIAGNOSIS — K648 Other hemorrhoids: Secondary | ICD-10-CM

## 2013-09-09 HISTORY — PX: COLONOSCOPY W/ BIOPSIES AND POLYPECTOMY: SHX1376

## 2013-09-09 HISTORY — DX: Diverticulosis of intestine, part unspecified, without perforation or abscess without bleeding: K57.90

## 2013-09-09 HISTORY — DX: Polyp of colon: K63.5

## 2013-09-09 MED ORDER — SODIUM CHLORIDE 0.9 % IV SOLN: 500.0000 mL | INTRAVENOUS | Status: DC

## 2013-09-09 NOTE — Progress Notes (Signed)
Lidocaine-40mg IV prior to Propofol InductionPropofol given over incremental dosages 

## 2013-09-09 NOTE — Progress Notes (Signed)
Called to room to assist during endoscopic procedure.  Patient ID and intended procedure confirmed with present staff. Received instructions for my participation in the procedure from the performing physician.  

## 2013-09-09 NOTE — Op Note (Signed)
[facility] [address] [place], [address]   COLONOSCOPY PROCEDURE REPORT  [name], [name]  MR#: [id number] BIRTHDATE: [birth year] , 47  yrs. old GENDER: Male ENDOSCOPIST: Iva Boop, MD, [facility] PROCEDURE DATE:  09/09/2013 PROCEDURE:   Colonoscopy with biopsy and snare polypectomy First Screening Colonoscopy - Avg.  risk and is 50 yrs.  old or older Yes.  Prior Negative Screening - Now for repeat screening. N/A  History of Adenoma - Now for follow-up colonoscopy & has been > or = to 3 yrs.  N/A  Polyps Removed Today? Yes. ASA CLASS:   Class II INDICATIONS:average risk screening and first colonoscopy. MEDICATIONS: propofol (Diprivan) 300mg  IV, These medications were titrated to [name] response per physician's verbal order, and MAC sedation, administered by CRNA  DESCRIPTION OF PROCEDURE:   After Cory risks benefits and alternatives of Cory procedure were thoroughly explained, informed consent was obtained.  A digital rectal exam revealed no abnormalities of Cory rectum, A digital rectal exam revealed no prostatic nodules, and A digital rectal exam revealed Cory prostate was not enlarged.   Cory LB CF-H180AL Loaner [id number]  endoscope was introduced through Cory anus and advanced to Cory cecum, which was identified by both Cory appendix and ileocecal valve. No adverse events experienced.   Cory quality of Cory prep was excellent using Suprep  Cory instrument was then slowly withdrawn as Cory colon was fully examined.  COLON FINDINGS: Six sessile polyps measuring 3-8 mm in size were found in Cory sigmoid colon.  A polypectomy was performed with a cold snare.  Cory resection was complete and Cory polyp tissue was completely retrieved.   Two areas of severely erythematous and edematous/redundant mucosa was found in Cory sigmoid colon. Multiple biopsies were performed using cold forceps. Diverticulosis was noted in Cory sigmoid colon.   Cory colon mucosa was  otherwise normal.  Retroflexed views revealed internal hemorrhoids. Cory time to cecum=1 minutes 50 seconds.  Withdrawal time=13 minutes 06 seconds.  Cory scope was withdrawn and Cory procedure completed. COMPLICATIONS: There were no complications.  ENDOSCOPIC IMPRESSION: 1.   Six sessile polyps measuring 3-8 mm in size were found in Cory sigmoid colon; polypectomy was performed with a cold snare 2.   Two areas of erythematous, redundant and edematous mucosa was found in Cory sigmoid colon; multiple biopsies were performed using cold forceps 3.   Diverticulosis was noted in Cory sigmoid colon 4.   Cory colon mucosa was otherwise normal  RECOMMENDATIONS: 1.  Timing of repeat colonoscopy will be determined by pathology findings. 2.   My office will arrange an appointment to treat hemorrhoids.  eSigned:  Iva Boop, MD, [facility] 09/09/2013 11:44 AM   cc: Thomos Lemons, DO and Cory [name]

## 2013-09-09 NOTE — Progress Notes (Signed)
Patient did not experience any of the following events: a burn prior to discharge; a fall within the facility; wrong site/side/patient/procedure/implant event; or a hospital transfer or hospital admission upon discharge from the facility. (G8907) Patient did not have preoperative order for IV antibiotic SSI prophylaxis. (G8918)  

## 2013-09-09 NOTE — Patient Instructions (Addendum)
I found and removed 6 benign-appearing polyps from your colon. I took biopsies of some abnormal-appearing mucosa (lining) - suspect it is traumafrom the prep or inflammation. You also have a condition called diverticulosis - common and not usually a problem. Please read the handout provided.  My office will contact you to arrange a hemorrhoid banding treatment.  I will let you know pathology results and when to have another routine colonoscopy by mail.  I appreciate the opportunity to care for you. Iva Boop, MD, FACG  YOU HAD AN ENDOSCOPIC PROCEDURE TODAY AT THE [facility]: Refer to the procedure report that was given to you for any specific questions about what was found during the examination.  If the procedure report does not answer your questions, please call your gastroenterologist to clarify.  If you requested that your care partner not be given the details of your procedure findings, then the procedure report has been included in a sealed envelope for you to review at your convenience later.  YOU SHOULD EXPECT: Some feelings of bloating in the abdomen. Passage of more gas than usual.  Walking can help get rid of the air that was put into your GI tract during the procedure and reduce the bloating. If you had a lower endoscopy (such as a colonoscopy or flexible sigmoidoscopy) you may notice spotting of blood in your stool or on the toilet paper. If you underwent a bowel prep for your procedure, then you may not have a normal bowel movement for a few days.  DIET: Your first meal following the procedure should be a light meal and then it is ok to progress to your normal diet.  A half-sandwich or bowl of soup is an example of a good first meal.  Heavy or fried foods are harder to digest and may make you feel nauseous or bloated.  Likewise meals heavy in dairy and vegetables can cause extra gas to form and this can also increase the bloating.  Drink plenty of fluids but you should  avoid alcoholic beverages for 24 hours.  ACTIVITY: Your care partner should take you home directly after the procedure.  You should plan to take it easy, moving slowly for the rest of the day.  You can resume normal activity the day after the procedure however you should NOT DRIVE or use heavy machinery for 24 hours (because of the sedation medicines used during the test).    SYMPTOMS TO REPORT IMMEDIATELY: A gastroenterologist can be reached at any hour.  During normal business hours, 8:30 AM to 5:00 PM Monday through Friday, call [phone].  After hours and on weekends, please call the GI answering service at [phone] who will take a message and have the physician on call contact you.   Following lower endoscopy (colonoscopy or flexible sigmoidoscopy):  Excessive amounts of blood in the stool  Significant tenderness or worsening of abdominal pains  Swelling of the abdomen that is new, acute  Fever of 100F or higher  FOLLOW UP: If any biopsies were taken you will be contacted by phone or by letter within the next 1-3 weeks.  Call your gastroenterologist if you have not heard about the biopsies in 3 weeks.  Our staff will call the home number listed on your records the next business day following your procedure to check on you and address any questions or concerns that you may have at that time regarding the information given to you following your procedure. This is a  courtesy call and so if there is no answer at the home number and we have not heard from you through the emergency physician on call, we will assume that you have returned to your regular daily activities without incident.  SIGNATURES/CONFIDENTIALITY: You and/or your care partner have signed paperwork which will be entered into your electronic medical record.  These signatures attest to the fact that that the information above on your After Visit Summary has been reviewed and is understood.  Full responsibility of the  confidentiality of this discharge information lies with you and/or your care-partner.  Polyps, Diverticulosis, hemorrhoids-handouts given  Repeat colonoscopy will be determined by pathology.

## 2013-09-10 ENCOUNTER — Telehealth: Payer: Self-pay | Admitting: *Deleted

## 2013-09-10 ENCOUNTER — Other Ambulatory Visit: Payer: BC Managed Care – PPO

## 2013-09-10 NOTE — Telephone Encounter (Signed)
Left message on number given in admitting to return call if questions problems or concerns. ewm

## 2013-09-15 ENCOUNTER — Other Ambulatory Visit (INDEPENDENT_AMBULATORY_CARE_PROVIDER_SITE_OTHER): Payer: BC Managed Care – PPO

## 2013-09-15 DIAGNOSIS — E039 Hypothyroidism, unspecified: Secondary | ICD-10-CM

## 2013-09-15 DIAGNOSIS — I10 Essential (primary) hypertension: Secondary | ICD-10-CM

## 2013-09-15 LAB — BASIC METABOLIC PANEL
Calcium: 9.2 mg/dL (ref 8.4–10.5)
Chloride: 103 mEq/L (ref 96–112)
Creatinine, Ser: 1.3 mg/dL (ref 0.4–1.5)
GFR: 73.32 mL/min (ref >60.00)
Glucose, Bld: 104 mg/dL — ABNORMAL HIGH (ref 70–99)
Potassium: 4 mEq/L (ref 3.5–5.1)
Sodium: 136 mEq/L (ref 135–145)

## 2013-09-15 LAB — TSH: TSH: 7.07 u[IU]/mL — ABNORMAL HIGH (ref 0.35–5.50)

## 2013-09-16 ENCOUNTER — Encounter: Payer: Self-pay | Admitting: Internal Medicine

## 2013-09-16 ENCOUNTER — Ambulatory Visit: Payer: BC Managed Care – PPO | Admitting: Internal Medicine

## 2013-09-16 NOTE — Progress Notes (Signed)
Quick Note:  Hyperplastic polyps - repeat colon 10 years 2024 ______

## 2013-09-17 ENCOUNTER — Ambulatory Visit: Payer: BC Managed Care – PPO | Admitting: Internal Medicine

## 2013-09-24 ENCOUNTER — Other Ambulatory Visit: Payer: Self-pay | Admitting: *Deleted

## 2013-09-24 MED ORDER — LEVOTHYROXINE SODIUM 125 MCG PO TABS: 125.0000 ug | ORAL_TABLET | Freq: Every day | ORAL | Status: DC

## 2013-10-07 ENCOUNTER — Ambulatory Visit: Payer: BC Managed Care – PPO | Admitting: Internal Medicine

## 2013-10-11 ENCOUNTER — Ambulatory Visit: Payer: BC Managed Care – PPO | Admitting: Internal Medicine

## 2013-10-13 ENCOUNTER — Ambulatory Visit: Payer: BC Managed Care – PPO | Admitting: Internal Medicine

## 2013-11-17 ENCOUNTER — Other Ambulatory Visit: Payer: BC Managed Care – PPO

## 2013-11-23 ENCOUNTER — Ambulatory Visit: Payer: BC Managed Care – PPO | Admitting: Internal Medicine

## 2013-11-24 ENCOUNTER — Ambulatory Visit: Payer: BC Managed Care – PPO | Admitting: Internal Medicine

## 2014-01-03 ENCOUNTER — Other Ambulatory Visit (INDEPENDENT_AMBULATORY_CARE_PROVIDER_SITE_OTHER): Payer: BC Managed Care – PPO

## 2014-01-03 DIAGNOSIS — I10 Essential (primary) hypertension: Secondary | ICD-10-CM

## 2014-01-03 DIAGNOSIS — E039 Hypothyroidism, unspecified: Secondary | ICD-10-CM

## 2014-01-03 LAB — BASIC METABOLIC PANEL
BUN: 15 mg/dL (ref 6–23)
CHLORIDE: 105 meq/L (ref 96–112)
CO2: 27 meq/L (ref 19–32)
CREATININE: 1.4 mg/dL (ref 0.4–1.5)
Calcium: 9.1 mg/dL (ref 8.4–10.5)
GFR: 71.98 mL/min (ref >60.00)
Glucose, Bld: 178 mg/dL — ABNORMAL HIGH (ref 70–99)
POTASSIUM: 3.6 meq/L (ref 3.5–5.1)
Sodium: 137 mEq/L (ref 135–145)

## 2014-01-03 LAB — TSH: TSH: 1.02 u[IU]/mL (ref 0.35–5.50)

## 2014-01-21 ENCOUNTER — Encounter: Payer: Self-pay | Admitting: Internal Medicine

## 2014-01-21 ENCOUNTER — Ambulatory Visit (INDEPENDENT_AMBULATORY_CARE_PROVIDER_SITE_OTHER): Payer: BC Managed Care – PPO | Admitting: Internal Medicine

## 2014-01-21 VITALS — BP 128/82 | HR 64 | Temp 97.9°F | Wt 173.0 lb

## 2014-01-21 DIAGNOSIS — E039 Hypothyroidism, unspecified: Secondary | ICD-10-CM

## 2014-01-21 DIAGNOSIS — R7309 Other abnormal glucose: Secondary | ICD-10-CM

## 2014-01-21 DIAGNOSIS — Z309 Encounter for contraceptive management, unspecified: Secondary | ICD-10-CM

## 2014-01-21 DIAGNOSIS — I10 Essential (primary) hypertension: Secondary | ICD-10-CM

## 2014-01-21 MED ORDER — LISINOPRIL 5 MG PO TABS: 5.0000 mg | ORAL_TABLET | Freq: Every day | ORAL | Status: DC

## 2014-01-21 MED ORDER — PRAVASTATIN SODIUM 40 MG PO TABS: 40.0000 mg | ORAL_TABLET | Freq: Every day | ORAL | Status: DC

## 2014-01-21 MED ORDER — LEVOTHYROXINE SODIUM 125 MCG PO TABS: 125.0000 ug | ORAL_TABLET | Freq: Every day | ORAL | Status: DC

## 2014-01-21 NOTE — Progress Notes (Signed)
Subjective:    Patient ID: Cory [name], male    DOB: 05-02-1966, 48 y.o.   MRN: [id number]  HPI  48 year old African American male with history of hypothyroidism, hyperlipidemia and hypertension for followup. Interval medical history-patient was seen by gastroenterologist for colonoscopy. Patient had 6 sessile polyps measuring 3-8 mm that were biopsied. Patient also found to have diverticulosis and hemorrhoids.  Hypertension-stable  Basic metabolic panel showed elevated glucose. Patient reports he was nonfasting. He is strong family history of type 2 diabetes.  Patient also requests referral to urologist for elective vasectomy.  Review of Systems Negative for chest pain or shortness of breath    Past Medical History  Diagnosis Date  . Hypothyroidism   . Hyperlipidemia   . Tobacco abuse   . Mild stage glaucoma     Dr Ricki Miller  . Hypertension   . GERD (gastroesophageal reflux disease)   . Hyperglycemia 04/14/2011  . ED (erectile dysfunction)   . Cholelithiasis   . Carpal tunnel syndrome of right wrist   . Condyloma   . Colon polyps 09/09/2013    hyperplastic  . Diverticulosis 09/09/2013    History   Social History  . Marital Status: Single    Spouse Name: N/A    Number of Children: 4  . Years of Education: N/A   Occupational History  .     Social History Main Topics  . Smoking status: Current Every Day Smoker -- 0.50 packs/day    Types: Cigarettes  . Smokeless tobacco: Never Used     Comment: "light" smoker since age 62  . Alcohol Use: Yes     Comment: 22 ounces daily  . Drug Use: Yes    Special: Marijuana     Comment: OCCASIONAL  . Sexual Activity: Not on file   Other Topics Concern  . Not on file   Social History Narrative   Married   occupation :  [place],  Research officer, political party    Current Smoker  - "light" smoker since age 75    Alcohol use-no     Past Surgical History  Procedure Laterality Date  . Colonoscopy  w/ biopsies and polypectomy  09/09/2013    hyperplastic polyps    Family History  Problem Relation Age of Onset  . Breast cancer Mother     deceased at age 56 secondary to complications of breast cancer  . Diabetes Father   . Stroke Father   . Glaucoma Father   . Hypertension Father   . Blindness Father     leagally blind  . Diabetes Brother   . Other Neg Hx     prostate cancer, colon cancer  . Alcohol abuse Brother   . Seizures Brother   . Heart disease Father     No Known Allergies  Current Outpatient Prescriptions on File Prior to Visit  Medication Sig Dispense Refill  . dorzolamide-timolol (COSOPT) 22.3-6.8 MG/ML ophthalmic solution Place 1 drop into both eyes 2 (two) times daily.      . hydrocortisone (ANUSOL-HC) 25 MG suppository Place 1 suppository (25 mg total) rectally at bedtime. For 3 nights then as needed  10 suppository  0  . latanoprost (XALATAN) 0.005 % ophthalmic solution Place 1 drop into both eyes at bedtime.      Marland Kitchen levothyroxine (SYNTHROID, LEVOTHROID) 125 MCG tablet Take 1 tablet (125 mcg total) by mouth daily.  30 tablet  2  . lisinopril (PRINIVIL,ZESTRIL) 5 MG tablet Take 1 tablet (  5 mg total) by mouth daily.  90 tablet  1  . pravastatin (PRAVACHOL) 40 MG tablet Take 1 tablet (40 mg total) by mouth daily.  90 tablet  1   No current facility-administered medications on file prior to visit.    BP 128/82  Pulse 64  Temp(Src) 97.9 F (36.6 C) (Oral)  Wt 173 lb (78.472 kg)    Objective:   Physical Exam  Constitutional: He is oriented to person, place, and time. He appears well-developed and well-nourished.  HENT:  Head: Normocephalic and atraumatic.  Eyes: EOM are normal. Pupils are equal, round, and reactive to light.  Cardiovascular: Normal rate, regular rhythm and normal heart sounds.   Pulmonary/Chest: Effort normal and breath sounds normal. He has no wheezes.  Musculoskeletal: He exhibits no edema.  Neurological: He is alert and oriented to  person, place, and time.  Skin: Skin is warm and dry.  Psychiatric: He has a normal mood and affect. His behavior is normal.          Assessment & Plan:

## 2014-01-21 NOTE — Assessment & Plan Note (Signed)
Patient with random elevated blood sugar. He has strong family history of type II diabetes. Patient advised to follow low carbohydrate diet and avoid sweets. Obtain A1c before next office visit.

## 2014-01-21 NOTE — Assessment & Plan Note (Signed)
Well controlled.  Continue same dose of lisinopril. BP: 128/82 mmHg

## 2014-01-21 NOTE — Patient Instructions (Signed)
Please complete the following lab tests before your next follow up appointment: BMET, A1c - 790.29 

## 2014-01-21 NOTE — Assessment & Plan Note (Signed)
TSH at goal.  Continue Levothyroxine 125 mcg daily. Lab Results  Component Value Date   TSH 1.02 01/03/2014

## 2014-01-21 NOTE — Progress Notes (Signed)
Pre visit review using our clinic review tool, if applicable. No additional management support is needed unless otherwise documented below in the visit note. 

## 2014-01-21 NOTE — Assessment & Plan Note (Signed)
Refer to Dr. Diona Fanti for vasectomy.

## 2014-01-24 ENCOUNTER — Telehealth: Payer: Self-pay | Admitting: Internal Medicine

## 2014-01-24 NOTE — Telephone Encounter (Signed)
Relevant patient education assigned to patient using Emmi. ° °

## 2014-01-24 NOTE — Telephone Encounter (Signed)
Relevant patient education mailed to patient.  

## 2014-01-25 ENCOUNTER — Telehealth: Payer: Self-pay | Admitting: Internal Medicine

## 2014-01-25 NOTE — Telephone Encounter (Signed)
Attempted to return call x3. No answer. Left VM message for patient to call office is assistance is needed. JK/CAN

## 2014-01-31 ENCOUNTER — Telehealth: Payer: Self-pay | Admitting: Internal Medicine

## 2014-01-31 NOTE — Telephone Encounter (Signed)
Pt would like to know the type of thyroid he has.

## 2014-02-01 NOTE — Telephone Encounter (Signed)
Pt is calling back waiting on answer

## 2014-02-01 NOTE — Telephone Encounter (Signed)
Pt was just wanting to know what his TSH level was.  Gave pt number

## 2014-03-09 ENCOUNTER — Other Ambulatory Visit (INDEPENDENT_AMBULATORY_CARE_PROVIDER_SITE_OTHER): Payer: BC Managed Care – PPO

## 2014-03-09 DIAGNOSIS — R7309 Other abnormal glucose: Secondary | ICD-10-CM

## 2014-03-09 LAB — BASIC METABOLIC PANEL
BUN: 12 mg/dL (ref 6–23)
CALCIUM: 8.5 mg/dL (ref 8.4–10.5)
CO2: 27 mEq/L (ref 19–32)
Chloride: 107 mEq/L (ref 96–112)
Creatinine, Ser: 1.3 mg/dL (ref 0.4–1.5)
GFR: 75.13 mL/min (ref >60.00)
GLUCOSE: 110 mg/dL — AB (ref 70–99)
Potassium: 3.7 mEq/L (ref 3.5–5.1)
Sodium: 139 mEq/L (ref 135–145)

## 2014-03-09 LAB — HEMOGLOBIN A1C: Hgb A1c MFr Bld: 5.7 % (ref 4.6–6.5)

## 2014-03-23 ENCOUNTER — Ambulatory Visit: Payer: BC Managed Care – PPO | Admitting: Internal Medicine

## 2014-04-06 ENCOUNTER — Encounter: Payer: Self-pay | Admitting: Internal Medicine

## 2014-04-06 ENCOUNTER — Ambulatory Visit (INDEPENDENT_AMBULATORY_CARE_PROVIDER_SITE_OTHER): Payer: BC Managed Care – PPO | Admitting: Internal Medicine

## 2014-04-06 VITALS — BP 120/84 | Temp 97.4°F | Wt 172.0 lb

## 2014-04-06 DIAGNOSIS — I10 Essential (primary) hypertension: Secondary | ICD-10-CM

## 2014-04-06 DIAGNOSIS — R7309 Other abnormal glucose: Secondary | ICD-10-CM

## 2014-04-06 DIAGNOSIS — E039 Hypothyroidism, unspecified: Secondary | ICD-10-CM

## 2014-04-06 MED ORDER — LEVOTHYROXINE SODIUM 125 MCG PO TABS: 125.0000 ug | ORAL_TABLET | Freq: Every day | ORAL | Status: DC

## 2014-04-06 MED ORDER — PRAVASTATIN SODIUM 40 MG PO TABS: 40.0000 mg | ORAL_TABLET | Freq: Every day | ORAL | Status: DC

## 2014-04-06 MED ORDER — LISINOPRIL 5 MG PO TABS: 5.0000 mg | ORAL_TABLET | Freq: Every day | ORAL | Status: DC

## 2014-04-06 NOTE — Assessment & Plan Note (Signed)
BP with manual cuff 140/80.  I stressed importance of medication compliance.

## 2014-04-06 NOTE — Progress Notes (Signed)
Subjective:    Patient ID: Cory [name], male    DOB: Feb 19, 1966, 48 y.o.   MRN: [id number]  HPI  48 year old African American male for followup regarding hypertension, hypothyroidism and abnormal glucose. Patient reports he has been following low-carb diet and avoiding sweets. His A1c was normal at 5.7.  BP with manual cuff is 140/80. Patient reports he has not taken his lisinopril for approximately one week. He is having financial issues.  Review of Systems Negative for chest pain    Past Medical History  Diagnosis Date  . Hypothyroidism   . Hyperlipidemia   . Tobacco abuse   . Mild stage glaucoma     Dr Ricki Miller  . Hypertension   . GERD (gastroesophageal reflux disease)   . Hyperglycemia 04/14/2011  . ED (erectile dysfunction)   . Cholelithiasis   . Carpal tunnel syndrome of right wrist   . Condyloma   . Colon polyps 09/09/2013    hyperplastic  . Diverticulosis 09/09/2013    History   Social History  . Marital Status: Single    Spouse Name: N/A    Number of Children: 4  . Years of Education: N/A   Occupational History  .     Social History Main Topics  . Smoking status: Current Every Day Smoker -- 0.50 packs/day    Types: Cigarettes  . Smokeless tobacco: Never Used     Comment: "light" smoker since age 3  . Alcohol Use: Yes     Comment: 22 ounces daily  . Drug Use: Yes    Special: Marijuana     Comment: OCCASIONAL  . Sexual Activity: Not on file   Other Topics Concern  . Not on file   Social History Narrative   Married   occupation :  [employer],  Research officer, political party    Current Smoker  - "light" smoker since age 35    Alcohol use-no     Past Surgical History  Procedure Laterality Date  . Colonoscopy w/ biopsies and polypectomy  09/09/2013    hyperplastic polyps    Family History  Problem Relation Age of Onset  . Breast cancer Mother     deceased at age 29 secondary to complications of breast cancer  .  Diabetes Father   . Stroke Father   . Glaucoma Father   . Hypertension Father   . Blindness Father     leagally blind  . Diabetes Brother   . Other Neg Hx     prostate cancer, colon cancer  . Alcohol abuse Brother   . Seizures Brother   . Heart disease Father     No Known Allergies  Current Outpatient Prescriptions on File Prior to Visit  Medication Sig Dispense Refill  . dorzolamide-timolol (COSOPT) 22.3-6.8 MG/ML ophthalmic solution Place 1 drop into both eyes 2 (two) times daily.      Marland Kitchen latanoprost (XALATAN) 0.005 % ophthalmic solution Place 1 drop into both eyes at bedtime.      Marland Kitchen levothyroxine (SYNTHROID, LEVOTHROID) 125 MCG tablet Take 1 tablet (125 mcg total) by mouth daily.  90 tablet  1  . lisinopril (PRINIVIL,ZESTRIL) 5 MG tablet Take 1 tablet (5 mg total) by mouth daily.  90 tablet  1  . pravastatin (PRAVACHOL) 40 MG tablet Take 1 tablet (40 mg total) by mouth daily.  90 tablet  1  . hydrocortisone (ANUSOL-HC) 25 MG suppository Place 1 suppository (25 mg total) rectally at bedtime. For 3 nights then  as needed  10 suppository  0   No current facility-administered medications on file prior to visit.    BP 120/84  Temp(Src) 97.4 F (36.3 C) (Oral)  Wt 172 lb (78.019 kg)    Objective:   Physical Exam  Constitutional: He is oriented to person, place, and time. He appears well-developed and well-nourished.  Cardiovascular: Normal rate, regular rhythm and normal heart sounds.   Pulmonary/Chest: Effort normal and breath sounds normal.  Musculoskeletal: He exhibits no edema.  Neurological: He is alert and oriented to person, place, and time.          Assessment & Plan:

## 2014-04-06 NOTE — Assessment & Plan Note (Signed)
Stable.  Monitor TFTs in 6 months.

## 2014-04-06 NOTE — Progress Notes (Signed)
Pre visit review using our clinic review tool, if applicable. No additional management support is needed unless otherwise documented below in the visit note. 

## 2014-04-06 NOTE — Patient Instructions (Signed)
Please complete the following lab tests before your next follow up appointment: TSH - 244.9 BMET - 401.9

## 2014-04-06 NOTE — Assessment & Plan Note (Signed)
A1c is normal.   Lab Results  Component Value Date   HGBA1C 5.7 03/09/2014   Considering patient's family history, patient still advised to avoid sweets.

## 2014-06-17 ENCOUNTER — Telehealth: Payer: Self-pay | Admitting: Internal Medicine

## 2014-06-17 DIAGNOSIS — H539 Unspecified visual disturbance: Secondary | ICD-10-CM

## 2014-06-17 NOTE — Telephone Encounter (Signed)
Referral order placed.

## 2014-06-17 NOTE — Telephone Encounter (Signed)
Pt states that is eye doctor ( Dr.Brewington) has retired and he transferred his records to dr. Venetia Maxon, pt is needing a referral to see the doctor. Pt has [facility].

## 2014-10-06 ENCOUNTER — Other Ambulatory Visit: Payer: BC Managed Care – PPO

## 2014-10-10 ENCOUNTER — Telehealth: Payer: Self-pay | Admitting: Internal Medicine

## 2014-10-10 NOTE — Telephone Encounter (Signed)
Patient states he is without insurance until 11/04/14 and would like to know if Dr. Shawna Orleans can see him without him having to pay $80 at time of visit?? He says he was donating plasma and his arm went numb.  The facility gave him a paper for Dr. Shawna Orleans to complete after evaluating him and told him they will pay for his doctor's visit.    Please advise.

## 2014-10-10 NOTE — Telephone Encounter (Signed)
Ok to schedule visit.  Please make sure pt brings form with him to office visit.

## 2014-10-11 ENCOUNTER — Encounter (HOSPITAL_COMMUNITY): Payer: Self-pay | Admitting: *Deleted

## 2014-10-11 ENCOUNTER — Emergency Department (INDEPENDENT_AMBULATORY_CARE_PROVIDER_SITE_OTHER)
Admission: EM | Admit: 2014-10-11 | Discharge: 2014-10-11 | Disposition: A | Discharge status: Home / Self Care | Payer: Self-pay | Source: Home / Self Care | Attending: Emergency Medicine | Admitting: Emergency Medicine

## 2014-10-11 DIAGNOSIS — T148 Other injury of unspecified body region: Secondary | ICD-10-CM

## 2014-10-11 DIAGNOSIS — T148XXA Other injury of unspecified body region, initial encounter: Secondary | ICD-10-CM

## 2014-10-11 HISTORY — DX: Heart failure, unspecified: I50.9

## 2014-10-11 MED ORDER — LEVOTHYROXINE SODIUM 125 MCG PO TABS: 125.0000 ug | ORAL_TABLET | Freq: Every day | ORAL | Status: DC

## 2014-10-11 MED ORDER — LATANOPROST 0.005 % OP SOLN: 1.0000 [drp] | Freq: Two times a day (BID) | OPHTHALMIC | Status: AC

## 2014-10-11 MED ORDER — DORZOLAMIDE HCL-TIMOLOL MAL 2-0.5 % OP SOLN: 1.0000 [drp] | Freq: Every day | OPHTHALMIC | Status: AC

## 2014-10-11 MED ORDER — LISINOPRIL 5 MG PO TABS: 5.0000 mg | ORAL_TABLET | Freq: Every day | ORAL | Status: DC

## 2014-10-11 MED ORDER — PRAVASTATIN SODIUM 40 MG PO TABS: 40.0000 mg | ORAL_TABLET | Freq: Every day | ORAL | Status: DC

## 2014-10-11 NOTE — ED Provider Notes (Signed)
Chief Complaint    Numbness   History of Present Illness     Cory [name] is a *48 year old male who donated plasma around November 24 and while he was getting the plasma he noted a numbness on the volar aspect of his right forearm. That was the arm he got the needlestick in. He states that the needle stick was not particular painful and there was no bruising afterwards. The numbness has persisted but seems to be gradually getting better. He denies any weakness in the arm. He's had no problems with dropping things, and is able to use a pen and pencil and a spoon. He denies any swelling, erythema, pain in the neck, shoulder, wrist, or hand.  Review of Systems     Other than as noted above, the patient denies any of the following symptoms: Systemic:  No fevers or chills.   Musculoskeletal:  No arthritis, back pain, or neck pain. Neurological:  No muscular weakness or paresthesias.  [place]    Past medical history, family history, social history, meds, and allergies were reviewed.    Physical Exam    Vital signs:  BP 165/97 mmHg  Pulse 57  Temp(Src) 98.7 F (37.1 C) (Oral)  Resp 18  SpO2 99% Gen:  Alert and oriented times 3.  In no distress. Musculoskeletal: There is a needlestick in the right antecubital fossa. This was nontender and there was no bruising or swelling. All joints had a full range of motion. Pulses were full. Muscle strength was normal. He reports diminished sensation to light touch over the volar aspect of the forearm.  Otherwise, all joints had a full a ROM with no swelling, bruising or deformity.  No edema, pulses full. Extremities were warm and pink.  Capillary refill was brisk.  Skin:  Clear, warm and dry.  No rash. Neuro:  Alert and oriented times 3.  Muscle strength was normal.  Sensation was intact to light touch.   Assessment    The encounter diagnosis was Nerve injury.  I think he had injury to superficial nerve. There is no evidence of muscle weakness  or involvement of the hand. This should get better with time. I advised him not to get any further needle sticks in that arm until his symptoms have cleared completely.  He's also on a number of medications which he has run out of. He was supplied with enough medications to last for a few months until he can get back into see his primary care physician.  Plan   1.  Meds:  The following meds were prescribed:   Discharge Medication List as of 10/11/2014  7:30 PM    START taking these medications   Details  !! levothyroxine (SYNTHROID) 125 MCG tablet Take 1 tablet (125 mcg total) by mouth daily before breakfast., Starting 10/11/2014, Until Discontinued, Normal    !! lisinopril (PRINIVIL,ZESTRIL) 5 MG tablet Take 1 tablet (5 mg total) by mouth daily., Starting 10/11/2014, Until Discontinued, Normal    !! pravastatin (PRAVACHOL) 40 MG tablet Take 1 tablet (40 mg total) by mouth daily., Starting 10/11/2014, Until Discontinued, Normal     !! - Potential duplicate medications found. Please discuss with provider.      2.  Patient Education/Counseling:  The patient was given appropriate handouts, self care instructions, and instructed in pain control, rest and activity, elevation, application of ice and compression.    3.  Follow up:  The patient was told to follow up here if no better in  3 to 4 days, or sooner if becoming worse in any way, and given some red flag symptoms such as worsening pain or new neurological symptoms which would prompt immediate return.     Harden Mo, MD 10/11/14 [phone]

## 2014-10-11 NOTE — ED Notes (Signed)
Pt. requested copy of his history.  Copy made.  Directed pt. to front desk to fill out medical release from with copy of picture ID.

## 2014-10-11 NOTE — Discharge Instructions (Signed)

## 2014-10-11 NOTE — Telephone Encounter (Signed)
Pt has been sch for 10/26/14. Pt is on wait list

## 2014-10-11 NOTE — ED Notes (Signed)
C/o numbness R forearm after giving plasma onset 11/24.  States he gives twice a week.  Numbness is there all the time.  Numbness is not as bad as it was initially.

## 2014-10-12 ENCOUNTER — Ambulatory Visit: Payer: BC Managed Care – PPO | Admitting: Internal Medicine

## 2014-10-13 ENCOUNTER — Ambulatory Visit: Payer: BC Managed Care – PPO | Admitting: Internal Medicine

## 2014-10-26 ENCOUNTER — Ambulatory Visit: Payer: BC Managed Care – PPO | Admitting: Internal Medicine

## 2014-11-09 ENCOUNTER — Other Ambulatory Visit (INDEPENDENT_AMBULATORY_CARE_PROVIDER_SITE_OTHER): Payer: Self-pay

## 2014-11-09 DIAGNOSIS — I10 Essential (primary) hypertension: Secondary | ICD-10-CM

## 2014-11-09 DIAGNOSIS — E785 Hyperlipidemia, unspecified: Secondary | ICD-10-CM

## 2014-11-09 LAB — BASIC METABOLIC PANEL
BUN: 11 mg/dL (ref 6–23)
CALCIUM: 9.9 mg/dL (ref 8.4–10.5)
CHLORIDE: 104 meq/L (ref 96–112)
CO2: 30 mEq/L (ref 19–32)
Creatinine, Ser: 1.3 mg/dL (ref 0.4–1.5)
GFR: 72.98 mL/min (ref >60.00)
Glucose, Bld: 83 mg/dL (ref 70–99)
POTASSIUM: 4.1 meq/L (ref 3.5–5.1)
SODIUM: 138 meq/L (ref 135–145)

## 2014-11-09 LAB — TSH: TSH: 32.84 u[IU]/mL — AB (ref 0.35–4.50)

## 2014-11-16 ENCOUNTER — Ambulatory Visit: Payer: BC Managed Care – PPO | Admitting: Internal Medicine

## 2014-11-22 MED ORDER — LEVOTHYROXINE SODIUM 125 MCG PO TABS: 125.0000 ug | ORAL_TABLET | Freq: Every day | ORAL | Status: DC

## 2014-11-22 NOTE — Addendum Note (Signed)
Addended by: Colleen Can on: 11/22/2014 04:53 PM   Modules accepted: Orders

## 2014-12-12 ENCOUNTER — Ambulatory Visit: Payer: Self-pay | Admitting: Internal Medicine

## 2015-01-25 ENCOUNTER — Encounter: Payer: Self-pay | Admitting: Internal Medicine

## 2015-01-25 ENCOUNTER — Ambulatory Visit (INDEPENDENT_AMBULATORY_CARE_PROVIDER_SITE_OTHER): Payer: 59 | Admitting: Internal Medicine

## 2015-01-25 VITALS — BP 114/72 | HR 76 | Temp 98.4°F | Ht 66.0 in | Wt 176.0 lb

## 2015-01-25 DIAGNOSIS — I10 Essential (primary) hypertension: Secondary | ICD-10-CM | POA: Diagnosis not present

## 2015-01-25 DIAGNOSIS — E039 Hypothyroidism, unspecified: Secondary | ICD-10-CM | POA: Diagnosis not present

## 2015-01-25 MED ORDER — PRAVASTATIN SODIUM 40 MG PO TABS: 40.0000 mg | ORAL_TABLET | Freq: Every day | ORAL | Status: DC

## 2015-01-25 MED ORDER — LEVOTHYROXINE SODIUM 125 MCG PO TABS: 125.0000 ug | ORAL_TABLET | Freq: Every day | ORAL | Status: DC

## 2015-01-25 MED ORDER — LISINOPRIL 5 MG PO TABS: 5.0000 mg | ORAL_TABLET | Freq: Every day | ORAL | Status: DC

## 2015-01-25 NOTE — Progress Notes (Signed)
Pre visit review using our clinic review tool, if applicable. No additional management support is needed unless otherwise documented below in the visit note. 

## 2015-01-25 NOTE — Assessment & Plan Note (Signed)
Patient did not take his thyroid medication for several months. Patient has been taking half dose due to communication error. Patient advised to take his usual dose of levothyroxine 125 g. Repeat TSH in 2 months before next office visit.

## 2015-01-25 NOTE — Patient Instructions (Signed)
Please complete the following lab tests before your next follow up appointment: BMET - 401.9 TSH - 244.9 FLP, LFTs - 272.4

## 2015-01-25 NOTE — Progress Notes (Signed)
Subjective:    Patient ID: Cory [name], male    DOB: Mar 31, 1966, 49 y.o.   MRN: [id number]  HPI  49 year old African-American male with history of hypertension, hypothyroidism and hyperlipidemia routine follow-up. Patient lost his insurance since previous visit. He did not take his medications for several months. His TSH was significantly elevated. Patient able to obtain new insurance. He restarted his medications. He was instructed to restart levothyroxine at half dose for 2 weeks then take full dose thereafter.  Lab Results  Component Value Date   TSH 32.84* 11/09/2014     He is still taking half dose of levothyroxine.   Review of Systems Mild fatigue, no significant weight change    Past Medical History  Diagnosis Date  . Hypothyroidism   . Hyperlipidemia   . Tobacco abuse   . Mild stage glaucoma     Dr Ricki Miller  . Hypertension   . GERD (gastroesophageal reflux disease)   . Hyperglycemia 04/14/2011  . ED (erectile dysfunction)   . Cholelithiasis   . Carpal tunnel syndrome of right wrist   . Condyloma   . Colon polyps 09/09/2013    hyperplastic  . Diverticulosis 09/09/2013  . CHF (congestive heart failure)     History   Social History  . Marital Status: Single    Spouse Name: N/A  . Number of Children: 4  . Years of Education: N/A   Occupational History  .     Social History Main Topics  . Smoking status: Current Every Day Smoker -- 0.50 packs/day    Types: Cigarettes  . Smokeless tobacco: Never Used     Comment: "light" smoker since age 49  . Alcohol Use: 8.4 oz/week    14 Cans of beer per week     Comment: 22 ounces daily  . Drug Use: Yes    Special: Marijuana     Comment: OCCASIONAL none since 08/11/14  . Sexual Activity: Not on file   Other Topics Concern  . Not on file   Social History Narrative   Married   occupation :  [place],  Research officer, political party    Current Smoker  - "light" smoker since age 26    Alcohol use-no     Past Surgical History  Procedure Laterality Date  . Colonoscopy w/ biopsies and polypectomy  09/09/2013    hyperplastic polyps    Family History  Problem Relation Age of Onset  . Breast cancer Mother     deceased at age 59 secondary to complications of breast cancer  . Diabetes Father   . Stroke Father   . Glaucoma Father   . Hypertension Father   . Blindness Father     leagally blind  . Heart disease Father   . Diabetes Brother   . Other Neg Hx     prostate cancer, colon cancer  . Alcohol abuse Brother   . Seizures Brother     No Known Allergies  Current Outpatient Prescriptions on File Prior to Visit  Medication Sig Dispense Refill  . dorzolamide-timolol (COSOPT) 22.3-6.8 MG/ML ophthalmic solution Place 1 drop into both eyes daily. 10 mL 2  . hydrocortisone (ANUSOL-HC) 25 MG suppository Place 1 suppository (25 mg total) rectally at bedtime. For 3 nights then as needed 10 suppository 0  . latanoprost (XALATAN) 0.005 % ophthalmic solution Place 1 drop into both eyes 2 (two) times daily. 2.5 mL 2  . levothyroxine (SYNTHROID, LEVOTHROID) 125 MCG tablet Take 1  tablet (125 mcg total) by mouth daily. 90 tablet 1  . lisinopril (PRINIVIL,ZESTRIL) 5 MG tablet Take 1 tablet (5 mg total) by mouth daily. 30 tablet 2  . pravastatin (PRAVACHOL) 40 MG tablet Take 1 tablet (40 mg total) by mouth daily. 30 tablet 2   No current facility-administered medications on file prior to visit.    BP 114/72 mmHg  Pulse 76  Temp(Src) 98.4 F (36.9 C) (Oral)  Ht 5\' 6"  (1.676 m)  Wt 176 lb (79.833 kg)  BMI 28.42 kg/m2    Objective:   Physical Exam  Constitutional: He is oriented to person, place, and time. He appears well-developed and well-nourished.  Cardiovascular: Normal rate and regular rhythm.   No murmur heard. Pulmonary/Chest: Effort normal and breath sounds normal. He has no wheezes.  Neurological: He is alert and oriented to person, place, and time. No cranial  nerve deficit.  Psychiatric: He has a normal mood and affect. His behavior is normal.          Assessment & Plan:

## 2015-01-25 NOTE — Assessment & Plan Note (Signed)
Patient ran out of his blood pressure medications due to lapse in health insurance coverage. Patient restarted lisinopril. Blood pressure is well-controlled. Smoking cessation strongly encouraged.  BP: 114/72 mmHg   Monitor electrolytes and kidney function before next office visit.

## 2015-03-22 ENCOUNTER — Other Ambulatory Visit (INDEPENDENT_AMBULATORY_CARE_PROVIDER_SITE_OTHER): Payer: 59

## 2015-03-22 DIAGNOSIS — E039 Hypothyroidism, unspecified: Secondary | ICD-10-CM

## 2015-03-22 DIAGNOSIS — E785 Hyperlipidemia, unspecified: Secondary | ICD-10-CM

## 2015-03-22 DIAGNOSIS — I10 Essential (primary) hypertension: Secondary | ICD-10-CM | POA: Diagnosis not present

## 2015-03-22 LAB — HEPATIC FUNCTION PANEL
ALBUMIN: 3.6 g/dL (ref 3.5–5.2)
ALK PHOS: 43 U/L (ref 39–117)
ALT: 27 U/L (ref 0–53)
AST: 26 U/L (ref 0–37)
BILIRUBIN DIRECT: 0.1 mg/dL (ref 0.0–0.3)
Total Bilirubin: 0.5 mg/dL (ref 0.2–1.2)
Total Protein: 6.7 g/dL (ref 6.0–8.3)

## 2015-03-22 LAB — LIPID PANEL
CHOLESTEROL: 192 mg/dL (ref 0–200)
HDL: 56.3 mg/dL (ref >39.00)
LDL Cholesterol: 120 mg/dL — ABNORMAL HIGH (ref 0–99)
NonHDL: 135.7
Total CHOL/HDL Ratio: 3
Triglycerides: 77 mg/dL (ref 0.0–149.0)
VLDL: 15.4 mg/dL (ref 0.0–40.0)

## 2015-03-22 LAB — BASIC METABOLIC PANEL
BUN: 12 mg/dL (ref 6–23)
CALCIUM: 8.9 mg/dL (ref 8.4–10.5)
CO2: 28 mEq/L (ref 19–32)
Chloride: 104 mEq/L (ref 96–112)
Creatinine, Ser: 1.28 mg/dL (ref 0.40–1.50)
GFR: 76.17 mL/min (ref >60.00)
Glucose, Bld: 99 mg/dL (ref 70–99)
Potassium: 3.9 mEq/L (ref 3.5–5.1)
Sodium: 136 mEq/L (ref 135–145)

## 2015-03-22 LAB — TSH: TSH: 2.06 u[IU]/mL (ref 0.35–4.50)

## 2015-03-24 ENCOUNTER — Other Ambulatory Visit: Payer: Self-pay | Admitting: *Deleted

## 2015-03-24 MED ORDER — PRAVASTATIN SODIUM 40 MG PO TABS: 40.0000 mg | ORAL_TABLET | Freq: Every day | ORAL | Status: DC

## 2015-03-27 ENCOUNTER — Ambulatory Visit: Payer: 59 | Admitting: Internal Medicine

## 2015-04-20 ENCOUNTER — Telehealth: Payer: Self-pay | Admitting: Internal Medicine

## 2015-04-20 NOTE — Telephone Encounter (Signed)
Patient is requesting to transfer from Dr. Shawna Orleans to Terri Piedra.  Please advise.

## 2015-04-21 NOTE — Telephone Encounter (Signed)
Ok with me 

## 2015-04-24 ENCOUNTER — Ambulatory Visit: Payer: 59 | Admitting: Internal Medicine

## 2015-04-25 NOTE — Telephone Encounter (Signed)
Left patient vm to call back to schedule.  °

## 2015-05-11 ENCOUNTER — Ambulatory Visit (INDEPENDENT_AMBULATORY_CARE_PROVIDER_SITE_OTHER): Payer: 59 | Admitting: Family

## 2015-05-11 ENCOUNTER — Encounter: Payer: Self-pay | Admitting: Family

## 2015-05-11 VITALS — BP 134/86 | HR 75 | Temp 99.0°F | Resp 18 | Ht 66.0 in | Wt 174.0 lb

## 2015-05-11 DIAGNOSIS — E785 Hyperlipidemia, unspecified: Secondary | ICD-10-CM | POA: Diagnosis not present

## 2015-05-11 DIAGNOSIS — E039 Hypothyroidism, unspecified: Secondary | ICD-10-CM | POA: Diagnosis not present

## 2015-05-11 DIAGNOSIS — K648 Other hemorrhoids: Secondary | ICD-10-CM

## 2015-05-11 MED ORDER — HYDROCORTISONE ACETATE 25 MG RE SUPP: 25.0000 mg | Freq: Two times a day (BID) | RECTAL | Status: DC | PRN

## 2015-05-11 MED ORDER — LEVOTHYROXINE SODIUM 125 MCG PO TABS: 125.0000 ug | ORAL_TABLET | Freq: Every day | ORAL | Status: DC

## 2015-05-11 MED ORDER — LISINOPRIL 5 MG PO TABS: 5.0000 mg | ORAL_TABLET | Freq: Every day | ORAL | Status: DC

## 2015-05-11 MED ORDER — PRAVASTATIN SODIUM 40 MG PO TABS: 40.0000 mg | ORAL_TABLET | Freq: Every day | ORAL | Status: DC

## 2015-05-11 NOTE — Patient Instructions (Signed)
Thank you for choosing [facility].  Summary/Instructions:  Your prescription(s) have been submitted to your pharmacy or been printed and provided for you. Please take as directed and contact our office if you believe you are having problem(s) with the medication(s) or have any questions.  If your symptoms worsen or fail to improve, please contact our office for further instruction, or in case of emergency go directly to the emergency room at the closest medical facility.    Hemorrhoids Hemorrhoids are swollen veins around the rectum or anus. There are two types of hemorrhoids:   Internal hemorrhoids. These occur in the veins just inside the rectum. They may poke through to the outside and become irritated and painful.  External hemorrhoids. These occur in the veins outside the anus and can be felt as a painful swelling or hard lump near the anus. CAUSES  Pregnancy.   Obesity.   Constipation or diarrhea.   Straining to have a bowel movement.   Sitting for long periods on the toilet.  Heavy lifting or other activity that caused you to strain.  Anal intercourse. SYMPTOMS   Pain.   Anal itching or irritation.   Rectal bleeding.   Fecal leakage.   Anal swelling.   One or more lumps around the anus.  DIAGNOSIS  Your caregiver may be able to diagnose hemorrhoids by visual examination. Other examinations or tests that may be performed include:   Examination of the rectal area with a gloved hand (digital rectal exam).   Examination of anal canal using a small tube (scope).   A blood test if you have lost a significant amount of blood.  A test to look inside the colon (sigmoidoscopy or colonoscopy). TREATMENT Most hemorrhoids can be treated at home. However, if symptoms do not seem to be getting better or if you have a lot of rectal bleeding, your caregiver may perform a procedure to help make the hemorrhoids get smaller or remove them completely.  Possible treatments include:   Placing a rubber band at the base of the hemorrhoid to cut off the circulation (rubber band ligation).   Injecting a chemical to shrink the hemorrhoid (sclerotherapy).   Using a tool to burn the hemorrhoid (infrared light therapy).   Surgically removing the hemorrhoid (hemorrhoidectomy).   Stapling the hemorrhoid to block blood flow to the tissue (hemorrhoid stapling).  HOME CARE INSTRUCTIONS   Eat foods with fiber, such as whole grains, beans, nuts, fruits, and vegetables. Ask your doctor about taking products with added fiber in them (fibersupplements).  Increase fluid intake. Drink enough water and fluids to keep your urine clear or pale yellow.   Exercise regularly.   Go to the bathroom when you have the urge to have a bowel movement. Do not wait.   Avoid straining to have bowel movements.   Keep the anal area dry and clean. Use wet toilet paper or moist towelettes after a bowel movement.   Medicated creams and suppositories may be used or applied as directed.   Only take over-the-counter or prescription medicines as directed by your caregiver.   Take warm sitz baths for 15-20 minutes, 3-4 times a day to ease pain and discomfort.   Place ice packs on the hemorrhoids if they are tender and swollen. Using ice packs between sitz baths may be helpful.   Put ice in a plastic bag.   Place a towel between your skin and the bag.   Leave the ice on for 15-20 minutes, 3-4 times  a day.   Do not use a donut-shaped pillow or sit on the toilet for long periods. This increases blood pooling and pain.  SEEK MEDICAL CARE IF:  You have increasing pain and swelling that is not controlled by treatment or medicine.  You have uncontrolled bleeding.  You have difficulty or you are unable to have a bowel movement.  You have pain or inflammation outside the area of the hemorrhoids. MAKE SURE YOU:  Understand these instructions.  Will  watch your condition.  Will get help right away if you are not doing well or get worse. Document Released: 10/18/2000 Document Revised: 10/07/2012 Document Reviewed: 08/25/2012 [facility] Patient Information 2015 [place], [place]. This information is not intended to replace advice given to you by your health care provider. Make sure you discuss any questions you have with your health care provider.   Fat and Cholesterol Control Diet Fat and cholesterol levels in your blood and organs are influenced by your diet. High levels of fat and cholesterol may lead to diseases of the heart, small and large blood vessels, gallbladder, liver, and pancreas. CONTROLLING FAT AND CHOLESTEROL WITH DIET Although exercise and lifestyle factors are important, your diet is key. That is because certain foods are known to raise cholesterol and others to lower it. The goal is to balance foods for their effect on cholesterol and more importantly, to replace saturated and trans fat with other types of fat, such as monounsaturated fat, polyunsaturated fat, and omega-3 fatty acids. On average, a person should consume no more than 15 to 17 g of saturated fat daily. Saturated and trans fats are considered "bad" fats, and they will raise LDL cholesterol. Saturated fats are primarily found in animal products such as meats, butter, and cream. However, that does not mean you need to give up all your favorite foods. Today, there are good tasting, low-fat, low-cholesterol substitutes for most of the things you like to eat. Choose low-fat or nonfat alternatives. Choose round or loin cuts of red meat. These types of cuts are lowest in fat and cholesterol. Chicken (without the skin), fish, veal, and ground [place] breast are great choices. Eliminate fatty meats, such as hot dogs and salami. Even shellfish have little or no saturated fat. Have a 3 oz (85 g) portion when you eat lean meat, poultry, or fish. Trans fats are also called "partially  hydrogenated oils." They are oils that have been scientifically manipulated so that they are solid at room temperature resulting in a longer shelf life and improved taste and texture of foods in which they are added. Trans fats are found in stick margarine, some tub margarines, cookies, crackers, and baked goods.  When baking and cooking, oils are a great substitute for butter. The monounsaturated oils are especially beneficial since it is believed they lower LDL and raise HDL. The oils you should avoid entirely are saturated tropical oils, such as coconut and palm.  Remember to eat a lot from food groups that are naturally free of saturated and trans fat, including fish, fruit, vegetables, beans, grains (barley, rice, couscous, bulgur wheat), and pasta (without cream sauces).  IDENTIFYING FOODS THAT LOWER FAT AND CHOLESTEROL  Soluble fiber may lower your cholesterol. This type of fiber is found in fruits such as apples, vegetables such as broccoli, potatoes, and carrots, legumes such as beans, peas, and lentils, and grains such as barley. Foods fortified with plant sterols (phytosterol) may also lower cholesterol. You should eat at least 2 g per day of  these foods for a cholesterol lowering effect.  Read package labels to identify low-saturated fats, trans fat free, and low-fat foods at the supermarket. Select cheeses that have only 2 to 3 g saturated fat per ounce. Use a heart-healthy tub margarine that is free of trans fats or partially hydrogenated oil. When buying baked goods (cookies, crackers), avoid partially hydrogenated oils. Breads and muffins should be made from whole grains (whole-wheat or whole oat flour, instead of "flour" or "enriched flour"). Buy non-creamy canned soups with reduced salt and no added fats.  FOOD PREPARATION TECHNIQUES  Never deep-fry. If you must fry, either stir-fry, which uses very little fat, or use non-stick cooking sprays. When possible, broil, bake, or roast meats, and  steam vegetables. Instead of putting butter or margarine on vegetables, use lemon and herbs, applesauce, and cinnamon (for squash and sweet potatoes). Use nonfat yogurt, salsa, and low-fat dressings for salads.  LOW-SATURATED FAT / LOW-FAT FOOD SUBSTITUTES Meats / Saturated Fat (g)  Avoid: Steak, marbled (3 oz/85 g) / 11 g  Choose: Steak, lean (3 oz/85 g) / 4 g  Avoid: Hamburger (3 oz/85 g) / 7 g  Choose: Hamburger, lean (3 oz/85 g) / 5 g  Avoid: Ham (3 oz/85 g) / 6 g  Choose: Ham, lean cut (3 oz/85 g) / 2.4 g  Avoid: Chicken, with skin, dark meat (3 oz/85 g) / 4 g  Choose: Chicken, skin removed, dark meat (3 oz/85 g) / 2 g  Avoid: Chicken, with skin, light meat (3 oz/85 g) / 2.5 g  Choose: Chicken, skin removed, light meat (3 oz/85 g) / 1 g Dairy / Saturated Fat (g)  Avoid: Whole milk (1 cup) / 5 g  Choose: Low-fat milk, 2% (1 cup) / 3 g  Choose: Low-fat milk, 1% (1 cup) / 1.5 g  Choose: Skim milk (1 cup) / 0.3 g  Avoid: Hard cheese (1 oz/28 g) / 6 g  Choose: Skim milk cheese (1 oz/28 g) / 2 to 3 g  Avoid: Cottage cheese, 4% fat (1 cup) / 6.5 g  Choose: Low-fat cottage cheese, 1% fat (1 cup) / 1.5 g  Avoid: Ice cream (1 cup) / 9 g  Choose: Sherbet (1 cup) / 2.5 g  Choose: Nonfat frozen yogurt (1 cup) / 0.3 g  Choose: Frozen fruit bar / trace  Avoid: Whipped cream (1 tbs) / 3.5 g  Choose: Nondairy whipped topping (1 tbs) / 1 g Condiments / Saturated Fat (g)  Avoid: Mayonnaise (1 tbs) / 2 g  Choose: Low-fat mayonnaise (1 tbs) / 1 g  Avoid: Butter (1 tbs) / 7 g  Choose: Extra light margarine (1 tbs) / 1 g  Avoid: Coconut oil (1 tbs) / 11.8 g  Choose: Olive oil (1 tbs) / 1.8 g  Choose: Corn oil (1 tbs) / 1.7 g  Choose: Safflower oil (1 tbs) / 1.2 g  Choose: Sunflower oil (1 tbs) / 1.4 g  Choose: Soybean oil (1 tbs) / 2.4 g  Choose: Canola oil (1 tbs) / 1 g Document Released: 10/21/2005 Document Revised: 02/15/2013 Document Reviewed:  01/19/2014 [employer] Patient Information 2015 [place], [place]. This information is not intended to replace advice given to you by your health care provider. Make sure you discuss any questions you have with your health care provider.

## 2015-05-11 NOTE — Assessment & Plan Note (Signed)
Stable with current dosage of levothyroxine. Continue levothyroxine at 125 g. Follow-up TSH in 3 months.

## 2015-05-11 NOTE — Progress Notes (Signed)
Subjective:    Patient ID: Cory [name], male    DOB: [birth year], 49 y.o.   MRN: [id number]  Chief Complaint  Patient presents with  . Establish Care    wants a refill of all medication, thinks he has hemerroids having some anal bleeding, wants to go over labs from 5/18    HPI:  Cory [name] is a 49 y.o. male with a PMH of gallstones, erectile dysfunction, hypertension, hemorrhoids, hyperglycemia, hyperlipidemia, hypothyroidism, and tobacco abuse who presents today for an office visit to establish care with this provider.    1.) Hypothyroid - Currently takes 125 mcg of levothyroxine as prescribed and denies any adverse side effects.  Lab Results  Component Value Date   TSH 2.06 03/22/2015    2.) Hemorrhoids - Associated symptoms blood in his stool is described as bright red blood that occurs with bowel movements. Denies any burning or itching. Denies constipation, hard stools or excessive wiping. There are no modifying factors that make it better.   3.) Hyperlipidemia - stable with current dosage of pravastatin. Takes the medication as prescribed and denies adverse side effects or myalgias.   Lab Results  Component Value Date   CHOL 192 03/22/2015   HDL 56.30 03/22/2015   LDLCALC 120* 03/22/2015   LDLDIRECT 155.6 02/15/2008   TRIG 77.0 03/22/2015   CHOLHDL 3 03/22/2015   No Known Allergies  Current Outpatient Prescriptions on File Prior to Visit  Medication Sig Dispense Refill  . latanoprost (XALATAN) 0.005 % ophthalmic solution Place 1 drop into both eyes 2 (two) times daily. 2.5 mL 2  . dorzolamide-timolol (COSOPT) 22.3-6.8 MG/ML ophthalmic solution Place 1 drop into both eyes daily. (Patient not taking: Reported on 05/11/2015) 10 mL 2   No current facility-administered medications on file prior to visit.    Past Medical History  Diagnosis Date  . Hypothyroidism   . Hyperlipidemia   . Tobacco abuse   . Mild stage glaucoma     Dr Ricki Miller  .  Hypertension   . GERD (gastroesophageal reflux disease)   . Hyperglycemia 04/14/2011  . ED (erectile dysfunction)   . Cholelithiasis   . Carpal tunnel syndrome of right wrist   . Condyloma   . Colon polyps 09/09/2013    hyperplastic  . Diverticulosis 09/09/2013  . CHF (congestive heart failure)      Review of Systems  Constitutional: Negative for fever and chills.  Gastrointestinal: Positive for blood in stool. Negative for nausea, vomiting, abdominal pain, diarrhea, abdominal distention, anal bleeding and rectal pain.      Objective:    BP 134/86 mmHg  Pulse 75  Temp(Src) 99 F (37.2 C) (Oral)  Resp 18  Ht 5\' 6"  (1.676 m)  Wt 174 lb (78.926 kg)  BMI 28.10 kg/m2  SpO2 98% Nursing note and vital signs reviewed.  Physical Exam  Constitutional: He is oriented to person, place, and time. He appears well-developed and well-nourished. No distress.  Cardiovascular: Normal rate, regular rhythm, normal heart sounds and intact distal pulses.   Pulmonary/Chest: Effort normal and breath sounds normal.  Neurological: He is alert and oriented to person, place, and time.  Skin: Skin is warm and dry.  Psychiatric: He has a normal mood and affect. His behavior is normal. Judgment and thought content normal.       Assessment & Plan:   Problem List Items Addressed This Visit      Cardiovascular and Mediastinum   Hemorrhoids, internal, with bleeding, and prolapse  Symptoms and exam consistent with internal hemorrhoids. Start Anusol suppositories as needed for burning and itching. Discussed importance of good anal hygiene and avoiding constipation through increasing fiber and not straining. Follow up if symptoms worsen or do not improve with medication.      Relevant Medications   hydrocortisone (ANUSOL-HC) 25 MG suppository   lisinopril (PRINIVIL,ZESTRIL) 5 MG tablet   pravastatin (PRAVACHOL) 40 MG tablet     Endocrine   Hypothyroidism - Primary    Stable with current dosage of  levothyroxine. Continue levothyroxine at 125 g. Follow-up TSH in 3 months.      Relevant Medications   levothyroxine (SYNTHROID, LEVOTHROID) 125 MCG tablet     Other   Hyperlipidemia    Hyperlipidemia is stable with current dosage of pravastatin and denies adverse effects or myalgias. Continue current dosage of pravastatin.      Relevant Medications   lisinopril (PRINIVIL,ZESTRIL) 5 MG tablet   pravastatin (PRAVACHOL) 40 MG tablet

## 2015-05-11 NOTE — Assessment & Plan Note (Signed)
Hyperlipidemia is stable with current dosage of pravastatin and denies adverse effects or myalgias. Continue current dosage of pravastatin.

## 2015-05-11 NOTE — Progress Notes (Signed)
Pre visit review using our clinic review tool, if applicable. No additional management support is needed unless otherwise documented below in the visit note. 

## 2015-05-11 NOTE — Assessment & Plan Note (Signed)
Symptoms and exam consistent with internal hemorrhoids. Start Anusol suppositories as needed for burning and itching. Discussed importance of good anal hygiene and avoiding constipation through increasing fiber and not straining. Follow up if symptoms worsen or do not improve with medication.

## 2015-08-01 ENCOUNTER — Ambulatory Visit: Payer: 59 | Admitting: Family

## 2015-09-30 ENCOUNTER — Other Ambulatory Visit: Payer: Self-pay | Admitting: Family

## 2015-10-13 ENCOUNTER — Telehealth: Payer: Self-pay | Admitting: Family

## 2015-10-13 NOTE — Telephone Encounter (Signed)
Patient Name: Cory [name]  DOB: [birth year]    Initial Comment Caller says that for the past week his fingers keep getting numb on both hands; mostly the right hand; including now; and at night they get really get numb around 3 AM. Sees Dr. Pearline Cables (not listed)    Nurse Assessment  Nurse: Verlin Fester, RN, [facility] Kidney Date/Time Eilene Ghazi Time): 10/13/2015 11:20:17 AM  Confirm and document reason for call. If symptomatic, describe symptoms. ---PAtient states he is having numbness in his hands and the right is worse  Has the patient traveled out of the country within the last 30 days? ---No  Does the patient have any new or worsening symptoms? ---Yes  Will a triage be completed? ---Yes  Related visit to physician within the last 2 weeks? ---No  Does the PT have any chronic conditions? (i.e. diabetes, asthma, etc.) ---Yes  List chronic conditions. ---"HTN, Glaucoma, cholesterol"  Is this a behavioral health or substance abuse call? ---No     Guidelines    Guideline Title Affirmed Question Affirmed Notes  Finger Pain [1] Numbness (i.e., loss of sensation) in hand or fingers AND [2] new-onset    Final Disposition User   See Physician within 24 Hours Noe, RN, [place] states his doctor is Dr. Veleta Miners, in EMR his doctor is Dr. Elna Breslow. Unable schedule Saturday appt yet and he requests a 11am appt. Asked him to call back later today for Saturday appt.   Referrals  [facility]   Disagree/Comply: Comply

## 2015-10-24 ENCOUNTER — Telehealth: Payer: Self-pay | Admitting: Family

## 2015-10-24 ENCOUNTER — Other Ambulatory Visit (INDEPENDENT_AMBULATORY_CARE_PROVIDER_SITE_OTHER): Payer: 59

## 2015-10-24 ENCOUNTER — Ambulatory Visit (INDEPENDENT_AMBULATORY_CARE_PROVIDER_SITE_OTHER): Payer: 59 | Admitting: Family

## 2015-10-24 ENCOUNTER — Encounter: Payer: Self-pay | Admitting: Family

## 2015-10-24 VITALS — BP 144/94 | HR 75 | Temp 98.5°F | Resp 18 | Ht 66.0 in | Wt 174.0 lb

## 2015-10-24 DIAGNOSIS — R200 Anesthesia of skin: Secondary | ICD-10-CM | POA: Insufficient documentation

## 2015-10-24 DIAGNOSIS — R252 Cramp and spasm: Secondary | ICD-10-CM

## 2015-10-24 DIAGNOSIS — R202 Paresthesia of skin: Secondary | ICD-10-CM

## 2015-10-24 DIAGNOSIS — E039 Hypothyroidism, unspecified: Secondary | ICD-10-CM | POA: Diagnosis not present

## 2015-10-24 DIAGNOSIS — E785 Hyperlipidemia, unspecified: Secondary | ICD-10-CM

## 2015-10-24 LAB — BASIC METABOLIC PANEL
BUN: 11 mg/dL (ref 6–23)
CHLORIDE: 106 meq/L (ref 96–112)
CO2: 30 meq/L (ref 19–32)
Calcium: 8.6 mg/dL (ref 8.4–10.5)
Creatinine, Ser: 1.23 mg/dL (ref 0.40–1.50)
GFR: 79.57 mL/min (ref >60.00)
GLUCOSE: 91 mg/dL (ref 70–99)
POTASSIUM: 4.5 meq/L (ref 3.5–5.1)
Sodium: 139 mEq/L (ref 135–145)

## 2015-10-24 LAB — HEMOGLOBIN A1C: Hgb A1c MFr Bld: 5.7 % (ref 4.6–6.5)

## 2015-10-24 LAB — TSH: TSH: 6.34 u[IU]/mL — ABNORMAL HIGH (ref 0.35–4.50)

## 2015-10-24 LAB — MAGNESIUM: Magnesium: 2 mg/dL (ref 1.5–2.5)

## 2015-10-24 MED ORDER — LEVOTHYROXINE SODIUM 137 MCG PO TABS: 137.0000 ug | ORAL_TABLET | Freq: Every day | ORAL | Status: DC

## 2015-10-24 NOTE — Assessment & Plan Note (Signed)
Stable and maintained on levothyroxine. Takes medication without side effect. Obtain TSH to check current status. Continue current dosage of levothyroxine pending TSH results.

## 2015-10-24 NOTE — Patient Instructions (Signed)
Thank you for choosing [facility].  Summary/Instructions:   Please stop by the lab on the basement level of the building for your blood work. Your results will be released to [place] (or called to you) after review, usually within 72 hours after test completion. If any changes need to be made, you will be notified at that same time.   If your symptoms worsen or fail to improve, please contact our office for further instruction, or in case of emergency go directly to the emergency room at the closest medical facility.   Please continue to take your medications as prescribed.  If your blood work returns normal then please try a cock up wrist splint for the right wrist/hand.

## 2015-10-24 NOTE — Telephone Encounter (Signed)
Please inform patient that his TSH is elevated at 6.27. Therefore I will be sending in a new dose of levothyroxine for him to take. Otherwise his A1c is within the normal limits indicating no diabetes. His kidney function, electrolytes and magnesium were also normal, therefore this numbness is likely related to the carpal tunnel or other orthopedic cause. I would recommend starting with the splint as we discussed called a cock up wrist splint. Also please continue to drink plenty of fluids and decrease caffeine to attempt to reduce leg cramps. Please have him follow up in 6 weeks for blood work.

## 2015-10-24 NOTE — Progress Notes (Signed)
Pre visit review using our clinic review tool, if applicable. No additional management support is needed unless otherwise documented below in the visit note. 

## 2015-10-24 NOTE — Assessment & Plan Note (Signed)
Numbness and tingling of the fingers most likely related to carpal tunnel syndrome however concern remains for vitamin/mineral deficiency. Obtain methylmalonic acid and A1c to rule out metabolic causes. If blood work returns normal, recommend cockup wrist splint at night as needed. Over-the-counter anti-inflammatories as needed for discomfort. Follow-up if symptoms worsen or fail to improve pending blood work.

## 2015-10-24 NOTE — Progress Notes (Signed)
Subjective:    Patient ID: Cory [name], male    DOB: [birth year], 49 y.o.   MRN: [id number]  Chief Complaint  Patient presents with  . Numbness    numbness in fingers, pain in right arm, legs sometimes gets numb, x1 month    HPI:  Cory [name] is a 49 y.o. male who  has a past medical history of Hypothyroidism; Hyperlipidemia; Tobacco abuse; Mild stage glaucoma; Hypertension; GERD (gastroesophageal reflux disease); Hyperglycemia (04/14/2011); ED (erectile dysfunction); Cholelithiasis; Carpal tunnel syndrome of right wrist; Condyloma; Colon polyps (09/09/2013); Diverticulosis (09/09/2013); and CHF (congestive heart failure) ([place]). and presents today for an acute office visit.  This is a new problem. Associated symptom of numbness located in his right fingers and pain located in his right arm has been going on for about a month. Pain is described as sharp on occasion that waxes and wanes and is not specifically associated with motion. The numbness of right hand is worse at night. Denies any modifying factors that make it better or make it worse. Denies trauma or sounds/sensation. He does donate plasma regularly. Denies any polydipisia, polyphagia or polyuria. No neck pain. Does a significant amount of lifting at work.   2.) Leg cramps - Associated symptoms of muscle cramps located in his bilateral calves has been going on for about 6 months with a frequency of 2 times since initial noticing that. Denies any modifying factors that make it better or worse. They eventually improve with time.   3.) Hypothyroidism - currently maintained on levothyroxine. Takes the medication as prescribed and denies adverse side effects. No changes to skin, hair, or nails or temperature intolerances noted.  Lab Results  Component Value Date   TSH 2.06 03/22/2015    No Known Allergies   Current Outpatient Prescriptions on File Prior to Visit  Medication Sig Dispense Refill  . dorzolamide-timolol  (COSOPT) 22.3-6.8 MG/ML ophthalmic solution Place 1 drop into both eyes daily. 10 mL 2  . latanoprost (XALATAN) 0.005 % ophthalmic solution Place 1 drop into both eyes 2 (two) times daily. 2.5 mL 2  . levothyroxine (SYNTHROID, LEVOTHROID) 125 MCG tablet Take 1 tablet (125 mcg total) by mouth daily. 90 tablet 0  . lisinopril (PRINIVIL,ZESTRIL) 5 MG tablet TAKE ONE TABLET BY MOUTH  DAILY 90 tablet 0  . pravastatin (PRAVACHOL) 40 MG tablet Take 1 tablet (40 mg total) by mouth daily. 30 tablet 2   No current facility-administered medications on file prior to visit.     Past Surgical History  Procedure Laterality Date  . Colonoscopy w/ biopsies and polypectomy  09/09/2013    hyperplastic polyps    Past Medical History  Diagnosis Date  . Hypothyroidism   . Hyperlipidemia   . Tobacco abuse   . Mild stage glaucoma     Dr Ricki Miller  . Hypertension   . GERD (gastroesophageal reflux disease)   . Hyperglycemia 04/14/2011  . ED (erectile dysfunction)   . Cholelithiasis   . Carpal tunnel syndrome of right wrist   . Condyloma   . Colon polyps 09/09/2013    hyperplastic  . Diverticulosis 09/09/2013  . CHF (congestive heart failure) ([place])     Review of Systems  Constitutional: Negative for fever and chills.  Respiratory: Negative for chest tightness and shortness of breath.   Cardiovascular: Negative for chest pain, palpitations and leg swelling.  Neurological: Positive for numbness. Negative for weakness.      Objective:    BP 144/94 mmHg  Pulse  75  Temp(Src) 98.5 F (36.9 C) (Oral)  Resp 18  Ht 5\' 6"  (1.676 m)  Wt 174 lb (78.926 kg)  BMI 28.10 kg/m2  SpO2 95% Nursing note and vital signs reviewed.  Physical Exam  Constitutional: He is oriented to person, place, and time. He appears well-developed and well-nourished. No distress.  Cardiovascular: Normal rate, regular rhythm, normal heart sounds and intact distal pulses.   Pulmonary/Chest: Effort normal and breath sounds  normal.  Musculoskeletal:  Right wrist/hand - no obvious deformity, discoloration, or edema. No palpable tenderness able to be elicited. Range of motion of the wrist and fingers is normal actively and passively. There is numbness and tingling noted in the distal aspects of the sixth second, third, and fourth fingers. Phalen's test is positive. Capillary refill is intact and appropriate.  Bilateral calves - no obvious deformity, discoloration, or edema noted. No tenderness elicited. Ankle range of motion within normal limits. Distal pulses and sensation are intact and appropriate. Negative Homans sign.  Neurological: He is alert and oriented to person, place, and time.  Skin: Skin is warm and dry.  Psychiatric: He has a normal mood and affect. His behavior is normal. Judgment and thought content normal.       Assessment & Plan:   Problem List Items Addressed This Visit      Endocrine   Hypothyroidism    Stable and maintained on levothyroxine. Takes medication without side effect. Obtain TSH to check current status. Continue current dosage of levothyroxine pending TSH results.      Relevant Orders   TSH     Other   Hyperlipidemia   Numbness and tingling in right hand - Primary    Numbness and tingling of the fingers most likely related to carpal tunnel syndrome however concern remains for vitamin/mineral deficiency. Obtain methylmalonic acid and A1c to rule out metabolic causes. If blood work returns normal, recommend cockup wrist splint at night as needed. Over-the-counter anti-inflammatories as needed for discomfort. Follow-up if symptoms worsen or fail to improve pending blood work.      Relevant Orders   Methylmalonic Acid   Hemoglobin A1c   Muscle cramps    Muscle cramps of undetermined origin however most likely related to dehydration however concern remains for possible claudication. Obtain ankle brachial index to rule out. Obtain basic metabolic panel and magnesium to rule out  electrolyte deficiencies. Recommend stretching multiple times through at the day and consuming plenty of non-caffeinated/non-alcoholic beverages. Follow-up after ankle brachial index for symptoms worsen or fail to improve prior to then.      Relevant Orders   Basic Metabolic Panel (BMET)   Magnesium   ABI

## 2015-10-24 NOTE — Assessment & Plan Note (Signed)
Muscle cramps of undetermined origin however most likely related to dehydration however concern remains for possible claudication. Obtain ankle brachial index to rule out. Obtain basic metabolic panel and magnesium to rule out electrolyte deficiencies. Recommend stretching multiple times through at the day and consuming plenty of non-caffeinated/non-alcoholic beverages. Follow-up after ankle brachial index for symptoms worsen or fail to improve prior to then.

## 2015-10-25 ENCOUNTER — Other Ambulatory Visit: Payer: Self-pay

## 2015-10-25 DIAGNOSIS — E785 Hyperlipidemia, unspecified: Secondary | ICD-10-CM

## 2015-10-25 MED ORDER — PRAVASTATIN SODIUM 40 MG PO TABS: 40.0000 mg | ORAL_TABLET | Freq: Every day | ORAL | Status: DC

## 2015-10-25 MED ORDER — LEVOTHYROXINE SODIUM 125 MCG PO TABS: 125.0000 ug | ORAL_TABLET | Freq: Every day | ORAL | Status: DC

## 2015-10-25 MED ORDER — LISINOPRIL 5 MG PO TABS: ORAL_TABLET | ORAL | Status: DC

## 2015-10-25 NOTE — Telephone Encounter (Signed)
Pt aware of results 

## 2015-10-25 NOTE — Telephone Encounter (Signed)
LVM for pt to call back.

## 2015-10-26 LAB — METHYLMALONIC ACID, SERUM: METHYLMALONIC ACID, QUANT: 106 nmol/L (ref 87–318)

## 2015-11-01 NOTE — Telephone Encounter (Signed)
Please inform patient that his methylmalonic acid which measures B12 is within the normal ranges and therefore unlikely causing the numbness in his hands. Therefore we cannot rule out carpal tunnel syndrome or neck pathology. If symptoms continue we can refer to neurology.

## 2015-11-02 NOTE — Telephone Encounter (Signed)
LVM letting pt know.  

## 2015-12-12 ENCOUNTER — Encounter: Payer: Self-pay | Admitting: Internal Medicine

## 2015-12-12 ENCOUNTER — Ambulatory Visit (INDEPENDENT_AMBULATORY_CARE_PROVIDER_SITE_OTHER): Payer: 59 | Admitting: Internal Medicine

## 2015-12-12 ENCOUNTER — Other Ambulatory Visit (INDEPENDENT_AMBULATORY_CARE_PROVIDER_SITE_OTHER): Payer: 59

## 2015-12-12 VITALS — BP 142/88 | HR 71 | Temp 98.4°F | Resp 16 | Ht 66.0 in | Wt 176.0 lb

## 2015-12-12 DIAGNOSIS — Z23 Encounter for immunization: Secondary | ICD-10-CM | POA: Diagnosis not present

## 2015-12-12 DIAGNOSIS — R10815 Periumbilic abdominal tenderness: Secondary | ICD-10-CM | POA: Diagnosis not present

## 2015-12-12 DIAGNOSIS — E039 Hypothyroidism, unspecified: Secondary | ICD-10-CM

## 2015-12-12 DIAGNOSIS — I10 Essential (primary) hypertension: Secondary | ICD-10-CM

## 2015-12-12 DIAGNOSIS — R7309 Other abnormal glucose: Secondary | ICD-10-CM

## 2015-12-12 DIAGNOSIS — R10819 Abdominal tenderness, unspecified site: Secondary | ICD-10-CM | POA: Insufficient documentation

## 2015-12-12 DIAGNOSIS — R1310 Dysphagia, unspecified: Secondary | ICD-10-CM

## 2015-12-12 DIAGNOSIS — K2100 Gastro-esophageal reflux disease with esophagitis, without bleeding: Secondary | ICD-10-CM | POA: Insufficient documentation

## 2015-12-12 DIAGNOSIS — E785 Hyperlipidemia, unspecified: Secondary | ICD-10-CM

## 2015-12-12 LAB — CBC WITH DIFFERENTIAL/PLATELET
BASOS ABS: 0 10*3/uL (ref 0.0–0.1)
Basophils Relative: 0.1 % (ref 0.0–3.0)
EOS ABS: 0.1 10*3/uL (ref 0.0–0.7)
Eosinophils Relative: 1.3 % (ref 0.0–5.0)
HEMATOCRIT: 44.7 % (ref 39.0–52.0)
HEMOGLOBIN: 14.8 g/dL (ref 13.0–17.0)
LYMPHS PCT: 18 % (ref 12.0–46.0)
Lymphs Abs: 1.5 10*3/uL (ref 0.7–4.0)
MCHC: 33 g/dL (ref 30.0–36.0)
MCV: 87.7 fl (ref 78.0–100.0)
Monocytes Absolute: 0.3 10*3/uL (ref 0.1–1.0)
Monocytes Relative: 3.5 % (ref 3.0–12.0)
NEUTROS ABS: 6.4 10*3/uL (ref 1.4–7.7)
Neutrophils Relative %: 77.1 % — ABNORMAL HIGH (ref 43.0–77.0)
PLATELETS: 257 10*3/uL (ref 150.0–400.0)
RBC: 5.1 Mil/uL (ref 4.22–5.81)
RDW: 14.1 % (ref 11.5–15.5)
WBC: 8.4 10*3/uL (ref 4.0–10.5)

## 2015-12-12 LAB — COMPREHENSIVE METABOLIC PANEL
ALBUMIN: 3.3 g/dL — AB (ref 3.5–5.2)
ALK PHOS: 42 U/L (ref 39–117)
ALT: 16 U/L (ref 0–53)
AST: 16 U/L (ref 0–37)
BILIRUBIN TOTAL: 0.3 mg/dL (ref 0.2–1.2)
BUN: 9 mg/dL (ref 6–23)
CALCIUM: 8.9 mg/dL (ref 8.4–10.5)
CO2: 33 mEq/L — ABNORMAL HIGH (ref 19–32)
CREATININE: 1.16 mg/dL (ref 0.40–1.50)
Chloride: 105 mEq/L (ref 96–112)
GFR: 85.09 mL/min (ref >60.00)
Glucose, Bld: 152 mg/dL — ABNORMAL HIGH (ref 70–99)
Potassium: 4.3 mEq/L (ref 3.5–5.1)
Sodium: 139 mEq/L (ref 135–145)
TOTAL PROTEIN: 6.3 g/dL (ref 6.0–8.3)

## 2015-12-12 LAB — LIPID PANEL
CHOLESTEROL: 221 mg/dL — AB (ref 0–200)
HDL: 60 mg/dL (ref >39.00)
LDL Cholesterol: 143 mg/dL — ABNORMAL HIGH (ref 0–99)
NonHDL: 161
TRIGLYCERIDES: 92 mg/dL (ref 0.0–149.0)
Total CHOL/HDL Ratio: 4
VLDL: 18.4 mg/dL (ref 0.0–40.0)

## 2015-12-12 LAB — URINALYSIS, ROUTINE W REFLEX MICROSCOPIC
Bilirubin Urine: NEGATIVE
Hgb urine dipstick: NEGATIVE
KETONES UR: NEGATIVE
Leukocytes, UA: NEGATIVE
Nitrite: NEGATIVE
RBC / HPF: NONE SEEN (ref 0–?)
SPECIFIC GRAVITY, URINE: 1.025 (ref 1.000–1.030)
Total Protein, Urine: NEGATIVE
URINE GLUCOSE: 100 — AB
UROBILINOGEN UA: 0.2 (ref 0.0–1.0)
pH: 7 (ref 5.0–8.0)

## 2015-12-12 LAB — LIPASE: Lipase: 26 U/L (ref 11.0–59.0)

## 2015-12-12 LAB — TSH: TSH: 2.38 u[IU]/mL (ref 0.35–4.50)

## 2015-12-12 LAB — FECAL OCCULT BLOOD, GUAIAC: Fecal Occult Blood: NEGATIVE

## 2015-12-12 LAB — AMYLASE: Amylase: 36 U/L (ref 27–131)

## 2015-12-12 MED ORDER — DEXLANSOPRAZOLE 60 MG PO CPDR: 60.0000 mg | DELAYED_RELEASE_CAPSULE | Freq: Every day | ORAL | Status: DC

## 2015-12-12 NOTE — Patient Instructions (Signed)

## 2015-12-12 NOTE — Progress Notes (Signed)
Pre visit review using our clinic review tool, if applicable. No additional management support is needed unless otherwise documented below in the visit note. 

## 2015-12-12 NOTE — Progress Notes (Signed)
Subjective:  Patient ID: Cory [name], male    DOB: April 25, 1966  Age: 50 y.o. MRN: [id number]  CC: Abdominal Pain; Hypothyroidism; Hypertension; and Gastroesophageal Reflux   HPI AGASTHYA LEWELLING presents for 3 day history of diffuse abdominal discomfort that is slowly getting better, he also complains of heartburn, indigestion and dysphagia. He has not taken anything for his symptoms. He requests a work note.  Outpatient Prescriptions Prior to Visit  Medication Sig Dispense Refill  . dorzolamide-timolol (COSOPT) 22.3-6.8 MG/ML ophthalmic solution Place 1 drop into both eyes daily. 10 mL 2  . levothyroxine (SYNTHROID, LEVOTHROID) 125 MCG tablet Take 1 tablet (125 mcg total) by mouth daily. 90 tablet 0  . lisinopril (PRINIVIL,ZESTRIL) 5 MG tablet TAKE ONE TABLET BY MOUTH  DAILY 90 tablet 0  . pravastatin (PRAVACHOL) 40 MG tablet Take 1 tablet (40 mg total) by mouth daily. 30 tablet 2  . latanoprost (XALATAN) 0.005 % ophthalmic solution Place 1 drop into both eyes 2 (two) times daily. (Patient not taking: Reported on 12/12/2015) 2.5 mL 2   No facility-administered medications prior to visit.    ROS Review of Systems  Constitutional: Negative.  Negative for fever, chills, diaphoresis, appetite change and fatigue.  HENT: Positive for trouble swallowing. Negative for sore throat and voice change.   Eyes: Negative.   Respiratory: Negative.  Negative for cough, choking, chest tightness, shortness of breath and stridor.   Cardiovascular: Negative.  Negative for chest pain, palpitations and leg swelling.  Gastrointestinal: Positive for abdominal pain. Negative for nausea, vomiting, diarrhea, constipation, blood in stool, abdominal distention, anal bleeding and rectal pain.  Endocrine: Negative.   Genitourinary: Negative.  Negative for dysuria, urgency, frequency, flank pain and difficulty urinating.  Musculoskeletal: Negative.  Negative for myalgias, back pain, joint swelling and  arthralgias.  Skin: Negative.  Negative for color change and rash.  Allergic/Immunologic: Negative.   Neurological: Negative.   Hematological: Negative.  Negative for adenopathy. Does not bruise/bleed easily.  Psychiatric/Behavioral: Negative.     Objective:  BP 142/88 mmHg  Pulse 71  Temp(Src) 98.4 F (36.9 C) (Oral)  Resp 16  Ht 5\' 6"  (1.676 m)  Wt 176 lb (79.833 kg)  BMI 28.42 kg/m2  SpO2 99%  BP Readings from Last 3 Encounters:  12/12/15 142/88  10/24/15 144/94  05/11/15 134/86    Wt Readings from Last 3 Encounters:  12/12/15 176 lb (79.833 kg)  10/24/15 174 lb (78.926 kg)  05/11/15 174 lb (78.926 kg)    Physical Exam  Constitutional: He is oriented to person, place, and time. No distress.  HENT:  Head: Normocephalic and atraumatic.  Mouth/Throat: Oropharynx is clear and moist. No oropharyngeal exudate.  Eyes: Conjunctivae are normal. Right eye exhibits no discharge. Left eye exhibits no discharge. No scleral icterus.  Neck: Normal range of motion. Neck supple. No JVD present. No tracheal deviation present. No thyromegaly present.  Cardiovascular: Normal rate, regular rhythm, normal heart sounds and intact distal pulses.  Exam reveals no gallop and no friction rub.   No murmur heard. Pulmonary/Chest: Effort normal and breath sounds normal. No stridor. No respiratory distress. He has no wheezes. He has no rales. He exhibits no tenderness.  Abdominal: Soft. Normal appearance and bowel sounds are normal. He exhibits no shifting dullness, no distension, no pulsatile liver, no fluid wave, no abdominal bruit, no ascites, no pulsatile midline mass and no mass. There is no hepatosplenomegaly, splenomegaly or hepatomegaly. There is tenderness in the periumbilical area. There is no rebound,  no guarding, no CVA tenderness, no tenderness at McBurney's point and negative Murphy's sign. No hernia. Hernia confirmed negative in the ventral area, confirmed negative in the right inguinal  area and confirmed negative in the left inguinal area.  Genitourinary: Rectum normal, testes normal and penis normal. Rectal exam shows no external hemorrhoid, no internal hemorrhoid, no fissure, no mass, no tenderness and anal tone normal. Guaiac negative stool. Prostate is enlarged (1+ smooth symm BPH). Prostate is not tender. Right testis shows no mass, no swelling and no tenderness. Right testis is descended. Left testis shows no mass, no swelling and no tenderness. Left testis is descended. Uncircumcised. No phimosis, paraphimosis, hypospadias, penile erythema or penile tenderness. No discharge found.  Musculoskeletal: Normal range of motion. He exhibits no edema or tenderness.  Lymphadenopathy:    He has no cervical adenopathy.       Right: No inguinal adenopathy present.       Left: No inguinal adenopathy present.  Neurological: He is oriented to person, place, and time.  Skin: Skin is warm and dry. No rash noted. He is not diaphoretic. No erythema. No pallor.  Vitals reviewed.   Lab Results  Component Value Date   WBC 8.4 12/12/2015   HGB 14.8 12/12/2015   HCT 44.7 12/12/2015   PLT 257.0 12/12/2015   GLUCOSE 152* 12/12/2015   CHOL 221* 12/12/2015   TRIG 92.0 12/12/2015   HDL 60.00 12/12/2015   LDLDIRECT 155.6 02/15/2008   LDLCALC 143* 12/12/2015   ALT 16 12/12/2015   AST 16 12/12/2015   NA 139 12/12/2015   K 4.3 12/12/2015   CL 105 12/12/2015   CREATININE 1.16 12/12/2015   BUN 9 12/12/2015   CO2 33* 12/12/2015   TSH 2.38 12/12/2015   PSA 1.58 03/16/2013   HGBA1C 5.7 10/24/2015    No results found.  Assessment & Plan:   Reyly was seen today for abdominal pain, hypothyroidism, hypertension and gastroesophageal reflux.  Diagnoses and all orders for this visit:  Essential hypertension- his blood pressure is well-controlled, electrolytes and renal function are stable, will continue the ACE inhibitor -     Urinalysis, Routine w reflex microscopic (not at [facility]);  Future  Need for influenza vaccination -     Flu Vaccine QUAD 36+ mos IM  Need for Tdap vaccination -     Tdap vaccine greater than or equal to 7yo IM  Hypothyroidism, unspecified hypothyroidism type- his TSH is in the normal range, he will remain on the current dose of Synthroid -     Lipid panel; Future -     TSH; Future  Abdominal tenderness, periumbilical- his exam is benign, his lab work is all within normal limits, there is no evidence of mass or intestinal bleeding, this may be related to GERD so will start a PPI. -     Amylase; Future -     Lipase; Future -     Comprehensive metabolic panel; Future -     CBC with Differential/Platelet; Future -     Urinalysis, Routine w reflex microscopic (not at [facility]); Future  Hyperlipidemia -     Lipid panel; Future -     TSH; Future  Gastroesophageal reflux disease with esophagitis- will start a PPI, he also has some atypical symptoms so I have asked to see gastroenterology to see if he needs an upper endoscopy -     Ambulatory referral to Gastroenterology -     dexlansoprazole (DEXILANT) 60 MG capsule; Take 1 capsule (60  mg total) by mouth daily.  Dysphagia- GI referral -     Ambulatory referral to Gastroenterology  Abnormal glucose   I am having Mr. Herrington start on dexlansoprazole. I am also having him maintain his dorzolamide-timolol, latanoprost, levothyroxine, pravastatin, lisinopril, and dorzolamide.  Meds ordered this encounter  Medications  . dorzolamide (TRUSOPT) 2 % ophthalmic solution    Sig:   . dexlansoprazole (DEXILANT) 60 MG capsule    Sig: Take 1 capsule (60 mg total) by mouth daily.    Dispense:  30 capsule    Refill:  11     Follow-up: Return in about 3 weeks (around 01/02/2016).  Scarlette Calico, MD

## 2015-12-13 ENCOUNTER — Encounter: Payer: Self-pay | Admitting: Internal Medicine

## 2015-12-25 ENCOUNTER — Other Ambulatory Visit: Payer: Self-pay | Admitting: Family

## 2015-12-25 DIAGNOSIS — I739 Peripheral vascular disease, unspecified: Secondary | ICD-10-CM

## 2015-12-29 ENCOUNTER — Ambulatory Visit (INDEPENDENT_AMBULATORY_CARE_PROVIDER_SITE_OTHER): Payer: 59 | Admitting: Family

## 2015-12-29 ENCOUNTER — Encounter: Payer: Self-pay | Admitting: Family

## 2015-12-29 VITALS — BP 124/80 | HR 66 | Temp 98.5°F | Resp 18 | Ht 66.0 in | Wt 171.0 lb

## 2015-12-29 DIAGNOSIS — S56911A Strain of unspecified muscles, fascia and tendons at forearm level, right arm, initial encounter: Secondary | ICD-10-CM | POA: Diagnosis not present

## 2015-12-29 DIAGNOSIS — S56919A Strain of unspecified muscles, fascia and tendons at forearm level, unspecified arm, initial encounter: Secondary | ICD-10-CM | POA: Insufficient documentation

## 2015-12-29 DIAGNOSIS — S46919A Strain of unspecified muscle, fascia and tendon at shoulder and upper arm level, unspecified arm, initial encounter: Secondary | ICD-10-CM | POA: Insufficient documentation

## 2015-12-29 DIAGNOSIS — S46911A Strain of unspecified muscle, fascia and tendon at shoulder and upper arm level, right arm, initial encounter: Secondary | ICD-10-CM

## 2015-12-29 MED ORDER — NAPROXEN-ESOMEPRAZOLE 500-20 MG PO TBEC: 1.0000 | DELAYED_RELEASE_TABLET | Freq: Two times a day (BID) | ORAL | Status: DC | PRN

## 2015-12-29 MED ORDER — DICLOFENAC SODIUM 2 % TD SOLN: 1.0000 "application " | Freq: Two times a day (BID) | TRANSDERMAL | Status: DC | PRN

## 2015-12-29 NOTE — Progress Notes (Signed)
Pre visit review using our clinic review tool, if applicable. No additional management support is needed unless otherwise documented below in the visit note. 

## 2015-12-29 NOTE — Assessment & Plan Note (Signed)
Elbow strain with discomfort noted over brachial radialis with manual muscle testing. Treat conservatively with ice and home exercise therapy. Start Vimovo and Pennsaid. Follow up if symptoms worsen or fail to improve.

## 2015-12-29 NOTE — Patient Instructions (Signed)
Thank you for choosing [facility].  Summary/Instructions:  Ice 2-3 times per day and after activity Stretches and exercises daily. Pennsaid - pinkie sized dose on area of soreness 2x daily Vimovo - 2x daily for 3 days.  Tennis elbow strap.  Your prescription(s) have been submitted to your pharmacy or been printed and provided for you. Please take as directed and contact our office if you believe you are having problem(s) with the medication(s) or have any questions.  If your symptoms worsen or fail to improve, please contact our office for further instruction, or in case of emergency go directly to the emergency room at the closest medical facility.   Lateral Epicondylitis With Rehab Lateral epicondylitis involves inflammation and pain around the outer portion of the elbow. The pain is caused by inflammation of the tendons in the forearm that bring back (extend) the wrist. Lateral epicondylitis is also called tennis elbow, because it is very common in tennis players. However, it may occur in any individual who extends the wrist repetitively. If lateral epicondylitis is left untreated, it may become a chronic problem. SYMPTOMS   Pain, tenderness, and inflammation on the outer (lateral) side of the elbow.  Pain or weakness with gripping activities.  Pain that increases with wrist-twisting motions (playing tennis, using a screwdriver, opening a door or a jar).  Pain with lifting objects, including a coffee cup. CAUSES  Lateral epicondylitis is caused by inflammation of the tendons that extend the wrist. Causes of injury may include:  Repetitive stress and strain on the muscles and tendons that extend the wrist.  Sudden change in activity level or intensity.  Incorrect grip in racquet sports.  Incorrect grip size of racquet (often too large).  Incorrect hitting position or technique (usually backhand, leading with the elbow).  Using a racket that is too heavy. RISK  INCREASES WITH:  Sports or occupations that require repetitive and/or strenuous forearm and wrist movements (tennis, squash, racquetball, carpentry).  Poor wrist and forearm strength and flexibility.  Failure to warm up properly before activity.  Resuming activity before healing, rehabilitation, and conditioning are complete. PREVENTION   Warm up and stretch properly before activity.  Maintain physical fitness:  Strength, flexibility, and endurance.  Cardiovascular fitness.  Wear and use properly fitted equipment.  Learn and use proper technique and have a coach correct improper technique.  Wear a tennis elbow (counterforce) brace. PROGNOSIS  The course of this condition depends on the degree of the injury. If treated properly, acute cases (symptoms lasting less than 4 weeks) are often resolved in 2 to 6 weeks. Chronic (longer lasting cases) often resolve in 3 to 6 months but may require physical therapy. RELATED COMPLICATIONS   Frequently recurring symptoms, resulting in a chronic problem. Properly treating the problem the first time decreases frequency of recurrence.  Chronic inflammation, scarring tendon degeneration, and partial tendon tear, requiring surgery.  Delayed healing or resolution of symptoms. TREATMENT  Treatment first involves the use of ice and medicine to reduce pain and inflammation. Strengthening and stretching exercises may help reduce discomfort if performed regularly. These exercises may be performed at home if the condition is an acute injury. Chronic cases may require a referral to a physical therapist for evaluation and treatment. Your caregiver may advise a corticosteroid injection to help reduce inflammation. Rarely, surgery is needed. MEDICATION  If pain medicine is needed, nonsteroidal anti-inflammatory medicines (aspirin and ibuprofen), or other minor pain relievers (acetaminophen), are often advised.  Do not take pain medicine  for 7 days before  surgery.  Prescription pain relievers may be given, if your caregiver thinks they are needed. Use only as directed and only as much as you need.  Corticosteroid injections may be recommended. These injections should be reserved only for the most severe cases, because they can only be given a certain number of times. HEAT AND COLD  Cold treatment (icing) should be applied for 10 to 15 minutes every 2 to 3 hours for inflammation and pain, and immediately after activity that aggravates your symptoms. Use ice packs or an ice massage.  Heat treatment may be used before performing stretching and strengthening activities prescribed by your caregiver, physical therapist, or athletic trainer. Use a heat pack or a warm water soak. SEEK MEDICAL CARE IF: Symptoms get worse or do not improve in 2 weeks, despite treatment. EXERCISES  RANGE OF MOTION (ROM) AND STRETCHING EXERCISES - Epicondylitis, Lateral (Tennis Elbow) These exercises may help you when beginning to rehabilitate your injury. Your symptoms may go away with or without further involvement from your physician, physical therapist, or athletic trainer. While completing these exercises, remember:   Restoring tissue flexibility helps normal motion to return to the joints. This allows healthier, less painful movement and activity.  An effective stretch should be held for at least 30 seconds.  A stretch should never be painful. You should only feel a gentle lengthening or release in the stretched tissue. RANGE OF MOTION - Wrist Flexion, Active-Assisted  Extend your right / left elbow with your fingers pointing down.*  Gently pull the back of your hand towards you, until you feel a gentle stretch on the top of your forearm.  Hold this position for __________ seconds. Repeat __________ times. Complete this exercise __________ times per day.  *If directed by your physician, physical therapist or athletic trainer, complete this stretch with your  elbow bent, rather than extended. RANGE OF MOTION - Wrist Extension, Active-Assisted  Extend your right / left elbow and turn your palm upwards.*  Gently pull your palm and fingertips back, so your wrist extends and your fingers point more toward the ground.  You should feel a gentle stretch on the inside of your forearm.  Hold this position for __________ seconds. Repeat __________ times. Complete this exercise __________ times per day. *If directed by your physician, physical therapist or athletic trainer, complete this stretch with your elbow bent, rather than extended. STRETCH - Wrist Flexion  Place the back of your right / left hand on a tabletop, leaving your elbow slightly bent. Your fingers should point away from your body.  Gently press the back of your hand down onto the table by straightening your elbow. You should feel a stretch on the top of your forearm.  Hold this position for __________ seconds. Repeat __________ times. Complete this stretch __________ times per day.  STRETCH - Wrist Extension   Place your right / left fingertips on a tabletop, leaving your elbow slightly bent. Your fingers should point backwards.  Gently press your fingers and palm down onto the table by straightening your elbow. You should feel a stretch on the inside of your forearm.  Hold this position for __________ seconds. Repeat __________ times. Complete this stretch __________ times per day.  STRENGTHENING EXERCISES - Epicondylitis, Lateral (Tennis Elbow) These exercises may help you when beginning to rehabilitate your injury. They may resolve your symptoms with or without further involvement from your physician, physical therapist, or athletic trainer. While completing these exercises, remember:  Muscles can gain both the endurance and the strength needed for everyday activities through controlled exercises.  Complete these exercises as instructed by your physician, physical therapist or  athletic trainer. Increase the resistance and repetitions only as guided.  You may experience muscle soreness or fatigue, but the pain or discomfort you are trying to eliminate should never worsen during these exercises. If this pain does get worse, stop and make sure you are following the directions exactly. If the pain is still present after adjustments, discontinue the exercise until you can discuss the trouble with your caregiver. STRENGTH - Wrist Flexors  Sit with your right / left forearm palm-up and fully supported on a table or countertop. Your elbow should be resting below the height of your shoulder. Allow your wrist to extend over the edge of the surface.  Loosely holding a __________ weight, or a piece of rubber exercise band or tubing, slowly curl your hand up toward your forearm.  Hold this position for __________ seconds. Slowly lower the wrist back to the starting position in a controlled manner. Repeat __________ times. Complete this exercise __________ times per day.  STRENGTH - Wrist Extensors  Sit with your right / left forearm palm-down and fully supported on a table or countertop. Your elbow should be resting below the height of your shoulder. Allow your wrist to extend over the edge of the surface.  Loosely holding a __________ weight, or a piece of rubber exercise band or tubing, slowly curl your hand up toward your forearm.  Hold this position for __________ seconds. Slowly lower the wrist back to the starting position in a controlled manner. Repeat __________ times. Complete this exercise __________ times per day.  STRENGTH - Ulnar Deviators  Stand with a ____________________ weight in your right / left hand, or sit while holding a rubber exercise band or tubing, with your healthy arm supported on a table or countertop.  Move your wrist, so that your pinkie travels toward your forearm and your thumb moves away from your forearm.  Hold this position for __________  seconds and then slowly lower the wrist back to the starting position. Repeat __________ times. Complete this exercise __________ times per day STRENGTH - Radial Deviators  Stand with a ____________________ weight in your right / left hand, or sit while holding a rubber exercise band or tubing, with your injured arm supported on a table or countertop.  Raise your hand upward in front of you or pull up on the rubber tubing.  Hold this position for __________ seconds and then slowly lower the wrist back to the starting position. Repeat __________ times. Complete this exercise __________ times per day. STRENGTH - Forearm Supinators   Sit with your right / left forearm supported on a table, keeping your elbow below shoulder height. Rest your hand over the edge, palm down.  Gently grip a hammer or a soup ladle.  Without moving your elbow, slowly turn your palm and hand upward to a "thumbs-up" position.  Hold this position for __________ seconds. Slowly return to the starting position. Repeat __________ times. Complete this exercise __________ times per day.  STRENGTH - Forearm Pronators   Sit with your right / left forearm supported on a table, keeping your elbow below shoulder height. Rest your hand over the edge, palm up.  Gently grip a hammer or a soup ladle.  Without moving your elbow, slowly turn your palm and hand upward to a "thumbs-up" position.  Hold this position for __________  seconds. Slowly return to the starting position. Repeat __________ times. Complete this exercise __________ times per day.  STRENGTH - Grip  Grasp a tennis ball, a dense sponge, or a large, rolled sock in your hand.  Squeeze as hard as you can, without increasing any pain.  Hold this position for __________ seconds. Release your grip slowly. Repeat __________ times. Complete this exercise __________ times per day.  STRENGTH - Elbow Extensors, Isometric  Stand or sit upright, on a firm surface.  Place your right / left arm so that your palm faces your stomach, and it is at the height of your waist.  Place your opposite hand on the underside of your forearm. Gently push up as your right / left arm resists. Push as hard as you can with both arms, without causing any pain or movement at your right / left elbow. Hold this stationary position for __________ seconds. Gradually release the tension in both arms. Allow your muscles to relax completely before repeating.   This information is not intended to replace advice given to you by your health care provider. Make sure you discuss any questions you have with your health care provider.   Document Released: 10/21/2005 Document Revised: 11/11/2014 Document Reviewed: 02/02/2009 Elsevier Interactive Patient Education [employer].

## 2015-12-29 NOTE — Progress Notes (Signed)
Subjective:    Patient ID: PADEN HAYDEN, male    DOB: 06-17-1966, 50 y.o.   MRN: [id number]  Chief Complaint  Patient presents with  . Arm Pain    having right arm pain, states it radiates from elbow to his shoulder, does heavy lifting at work    HPI:  HOY LYDAY is a 50 y.o. male who  has a past medical history of Hypothyroidism; Hyperlipidemia; Tobacco abuse; Mild stage glaucoma; Hypertension; GERD (gastroesophageal reflux disease); Hyperglycemia (04/14/2011); ED (erectile dysfunction); Cholelithiasis; Carpal tunnel syndrome of right wrist; Condyloma; Colon polyps (09/09/2013); Diverticulosis (09/09/2013); and CHF (congestive heart failure) ([place]). and presents today for an acute office visit.  This is a new problem. Associated symptom of pain located in his right arm that radiates from his elbow to his shoulder has been going on for approximately 1 week if not longer. Pain is described as a numbing pain. Denies any modifying factors/treatments that make it better. Aggravating with lifting and twisting motions. He makes mattress and box springs. Denies any specific trauma or sounds/senations heard or felt.   No Known Allergies   Current Outpatient Prescriptions on File Prior to Visit  Medication Sig Dispense Refill  . dexlansoprazole (DEXILANT) 60 MG capsule Take 1 capsule (60 mg total) by mouth daily. 30 capsule 11  . dorzolamide (TRUSOPT) 2 % ophthalmic solution     . dorzolamide-timolol (COSOPT) 22.3-6.8 MG/ML ophthalmic solution Place 1 drop into both eyes daily. 10 mL 2  . latanoprost (XALATAN) 0.005 % ophthalmic solution Place 1 drop into both eyes 2 (two) times daily. (Patient not taking: Reported on 12/12/2015) 2.5 mL 2  . levothyroxine (SYNTHROID, LEVOTHROID) 125 MCG tablet Take 1 tablet (125 mcg total) by mouth daily. 90 tablet 0  . lisinopril (PRINIVIL,ZESTRIL) 5 MG tablet TAKE ONE TABLET BY MOUTH  DAILY 90 tablet 0  . pravastatin (PRAVACHOL) 40 MG tablet Take 1  tablet (40 mg total) by mouth daily. 30 tablet 2   No current facility-administered medications on file prior to visit.    Review of Systems  Constitutional: Negative for fever and chills.  Musculoskeletal:       Positive for right arm pain.   Neurological: Positive for weakness and numbness.      Objective:    BP 124/80 mmHg  Pulse 66  Temp(Src) 98.5 F (36.9 C) (Oral)  Resp 18  Ht 5\' 6"  (1.676 m)  Wt 171 lb (77.565 kg)  BMI 27.61 kg/m2  SpO2 98% Nursing note and vital signs reviewed.  Physical Exam  Constitutional: He is oriented to person, place, and time. He appears well-developed and well-nourished. No distress.  Cardiovascular: Normal rate, regular rhythm, normal heart sounds and intact distal pulses.   Pulmonary/Chest: Effort normal and breath sounds normal.  Musculoskeletal:  Right elbow - no obvious deformity, discoloration, or edema. Tenderness elicited along origin of brachial radialis and wrist extensor muscle group. Muscle strength is 5+. Range of motion is intact and appropriate with discomfort noted during pronation and flexion with neutral position. Pulses are intact and appropriate. Negative ligamentous testing.  Neurological: He is alert and oriented to person, place, and time.  Skin: Skin is warm and dry.  Psychiatric: He has a normal mood and affect. His behavior is normal. Judgment and thought content normal.       Assessment & Plan:   Problem List Items Addressed This Visit      Musculoskeletal and Integument   Elbow strain - Primary  Elbow strain with discomfort noted over brachial radialis with manual muscle testing. Treat conservatively with ice and home exercise therapy. Start Vimovo and Pennsaid. Follow up if symptoms worsen or fail to improve.       Relevant Medications   Diclofenac Sodium (PENNSAID) 2 % SOLN   Naproxen-Esomeprazole 500-20 MG TBEC

## 2016-01-04 ENCOUNTER — Ambulatory Visit: Payer: 59 | Admitting: Family

## 2016-01-05 ENCOUNTER — Ambulatory Visit (HOSPITAL_COMMUNITY)
Admission: RE | Admit: 2016-01-05 | Discharge: 2016-01-05 | Disposition: A | Discharge status: Home / Self Care | Payer: 59 | Source: Ambulatory Visit | Attending: Family | Admitting: Family

## 2016-01-05 DIAGNOSIS — E785 Hyperlipidemia, unspecified: Secondary | ICD-10-CM | POA: Insufficient documentation

## 2016-01-05 DIAGNOSIS — I739 Peripheral vascular disease, unspecified: Secondary | ICD-10-CM | POA: Insufficient documentation

## 2016-01-05 DIAGNOSIS — I10 Essential (primary) hypertension: Secondary | ICD-10-CM | POA: Insufficient documentation

## 2016-01-23 ENCOUNTER — Telehealth: Payer: Self-pay | Admitting: Family

## 2016-01-23 NOTE — Telephone Encounter (Signed)
Please inform patient that his ankle-brachial index is normal meaning the circulation in his lower legs is normal and not the cause of muscle cramping.

## 2016-01-25 NOTE — Telephone Encounter (Signed)
LVM letting pt know.  

## 2016-02-14 ENCOUNTER — Telehealth: Payer: Self-pay | Admitting: Family

## 2016-02-14 ENCOUNTER — Encounter: Payer: Self-pay | Admitting: Internal Medicine

## 2016-02-14 ENCOUNTER — Other Ambulatory Visit (INDEPENDENT_AMBULATORY_CARE_PROVIDER_SITE_OTHER): Payer: 59

## 2016-02-14 ENCOUNTER — Ambulatory Visit (INDEPENDENT_AMBULATORY_CARE_PROVIDER_SITE_OTHER): Payer: 59 | Admitting: Internal Medicine

## 2016-02-14 VITALS — BP 132/76 | HR 68 | Temp 98.6°F | Resp 20 | Wt 167.0 lb

## 2016-02-14 DIAGNOSIS — R112 Nausea with vomiting, unspecified: Secondary | ICD-10-CM

## 2016-02-14 DIAGNOSIS — R197 Diarrhea, unspecified: Secondary | ICD-10-CM | POA: Diagnosis not present

## 2016-02-14 DIAGNOSIS — I10 Essential (primary) hypertension: Secondary | ICD-10-CM | POA: Diagnosis not present

## 2016-02-14 DIAGNOSIS — R739 Hyperglycemia, unspecified: Secondary | ICD-10-CM | POA: Diagnosis not present

## 2016-02-14 LAB — HEPATIC FUNCTION PANEL
ALBUMIN: 4.3 g/dL (ref 3.5–5.2)
ALT: 19 U/L (ref 0–53)
AST: 19 U/L (ref 0–37)
Alkaline Phosphatase: 56 U/L (ref 39–117)
BILIRUBIN DIRECT: 0.2 mg/dL (ref 0.0–0.3)
TOTAL PROTEIN: 8.2 g/dL (ref 6.0–8.3)
Total Bilirubin: 0.7 mg/dL (ref 0.2–1.2)

## 2016-02-14 LAB — URINALYSIS, ROUTINE W REFLEX MICROSCOPIC
Hgb urine dipstick: NEGATIVE
KETONES UR: 15 — AB
Leukocytes, UA: NEGATIVE
Nitrite: NEGATIVE
PH: 5.5 (ref 5.0–8.0)
Specific Gravity, Urine: >=1.03 — AB (ref 1.000–1.030)
TOTAL PROTEIN, URINE-UPE24: 30 — AB
UROBILINOGEN UA: 0.2 (ref 0.0–1.0)
Urine Glucose: NEGATIVE

## 2016-02-14 LAB — BASIC METABOLIC PANEL
BUN: 12 mg/dL (ref 6–23)
CALCIUM: 9.7 mg/dL (ref 8.4–10.5)
CO2: 29 meq/L (ref 19–32)
CREATININE: 1.29 mg/dL (ref 0.40–1.50)
Chloride: 105 mEq/L (ref 96–112)
GFR: 75.22 mL/min (ref >60.00)
Glucose, Bld: 104 mg/dL — ABNORMAL HIGH (ref 70–99)
Potassium: 4.3 mEq/L (ref 3.5–5.1)
Sodium: 140 mEq/L (ref 135–145)

## 2016-02-14 LAB — CBC WITH DIFFERENTIAL/PLATELET
BASOS ABS: 0.1 10*3/uL (ref 0.0–0.1)
Basophils Relative: 0.9 % (ref 0.0–3.0)
EOS ABS: 0.1 10*3/uL (ref 0.0–0.7)
Eosinophils Relative: 1 % (ref 0.0–5.0)
HEMATOCRIT: 48.9 % (ref 39.0–52.0)
HEMOGLOBIN: 16.4 g/dL (ref 13.0–17.0)
LYMPHS PCT: 19.7 % (ref 12.0–46.0)
Lymphs Abs: 2.1 10*3/uL (ref 0.7–4.0)
MCHC: 33.6 g/dL (ref 30.0–36.0)
MCV: 86.2 fl (ref 78.0–100.0)
MONOS PCT: 6.2 % (ref 3.0–12.0)
Monocytes Absolute: 0.7 10*3/uL (ref 0.1–1.0)
NEUTROS ABS: 7.6 10*3/uL (ref 1.4–7.7)
Neutrophils Relative %: 72.2 % (ref 43.0–77.0)
Platelets: 263 10*3/uL (ref 150.0–400.0)
RBC: 5.68 Mil/uL (ref 4.22–5.81)
RDW: 14.1 % (ref 11.5–15.5)
WBC: 10.5 10*3/uL (ref 4.0–10.5)

## 2016-02-14 MED ORDER — ONDANSETRON HCL 4 MG PO TABS: 4.0000 mg | ORAL_TABLET | Freq: Three times a day (TID) | ORAL | Status: DC | PRN

## 2016-02-14 MED ORDER — DICYCLOMINE HCL 10 MG PO CAPS: 10.0000 mg | ORAL_CAPSULE | Freq: Three times a day (TID) | ORAL | Status: DC

## 2016-02-14 NOTE — Progress Notes (Signed)
Pre visit review using our clinic review tool, if applicable. No additional management support is needed unless otherwise documented below in the visit note. 

## 2016-02-14 NOTE — Telephone Encounter (Signed)
Pt called in to request a pain medication. Pt says that he was seen today.   CB: [phone]

## 2016-02-14 NOTE — Progress Notes (Signed)
Subjective:    Patient ID: Cory [name], male    DOB: 1966/02/25, 50 y.o.   MRN: [id number]  HPI    Here with 3-4 days onset acute onset fever, crampy abd pains, n/v mult times but able to keep some fluids down, lost a few lbs due to little solid food, and mult episodes mod to large volume watery non bloody stool several times per day, has not been able to go to work.  Pt denies chest pain, increased sob or doe, wheezing, orthopnea, PND, increased LE swelling, palpitations, dizziness or syncope.  Pt denies new neurological symptoms such as new headache, or facial or extremity weakness or numbness   Pt denies polydipsia, polyuria Past Medical History  Diagnosis Date  . Hypothyroidism   . Hyperlipidemia   . Tobacco abuse   . Mild stage glaucoma     Dr Ricki Miller  . Hypertension   . GERD (gastroesophageal reflux disease)   . Hyperglycemia 04/14/2011  . ED (erectile dysfunction)   . Cholelithiasis   . Carpal tunnel syndrome of right wrist   . Condyloma   . Colon polyps 09/09/2013    hyperplastic  . Diverticulosis 09/09/2013  . CHF (congestive heart failure) [facility])    Past Surgical History  Procedure Laterality Date  . Colonoscopy w/ biopsies and polypectomy  09/09/2013    hyperplastic polyps    reports that he has been smoking Cigarettes.  He has a 28.5 pack-year smoking history. He has never used smokeless tobacco. He reports that he drinks about 3.6 oz of alcohol per week. He reports that he uses illicit drugs (Marijuana). family history includes Alcohol abuse in his brother; Blindness in his father; Breast cancer in his mother; Diabetes in his brother and father; Glaucoma in his father; Heart disease in his father; Hypertension in his father; Seizures in his brother; Stroke in his father. There is no history of Other. No Known Allergies Current Outpatient Prescriptions on File Prior to Visit  Medication Sig Dispense Refill  . dorzolamide (TRUSOPT) 2 % ophthalmic solution     .  dorzolamide-timolol (COSOPT) 22.3-6.8 MG/ML ophthalmic solution Place 1 drop into both eyes daily. 10 mL 2  . latanoprost (XALATAN) 0.005 % ophthalmic solution Place 1 drop into both eyes 2 (two) times daily. 2.5 mL 2  . levothyroxine (SYNTHROID, LEVOTHROID) 125 MCG tablet Take 1 tablet (125 mcg total) by mouth daily. 90 tablet 0  . lisinopril (PRINIVIL,ZESTRIL) 5 MG tablet TAKE ONE TABLET BY MOUTH  DAILY 90 tablet 0  . pravastatin (PRAVACHOL) 40 MG tablet Take 1 tablet (40 mg total) by mouth daily. 30 tablet 2   No current facility-administered medications on file prior to visit.   Review of Systems  Constitutional: Negative for unusual diaphoresis or night sweats HENT: Negative for ear swelling or discharge Eyes: Negative for worsening visual haziness  Respiratory: Negative for choking and stridor.   Gastrointestinal: Negative for distension or worsening eructation Genitourinary: Negative for retention or change in urine volume.  Musculoskeletal: Negative for other MSK pain or swelling Skin: Negative for color change and worsening wound Neurological: Negative for tremors and numbness other than noted  Psychiatric/Behavioral: Negative for decreased concentration or agitation other than above       Objective:   Physical Exam BP 132/76 mmHg  Pulse 68  Temp(Src) 98.6 F (37 C) (Oral)  Resp 20  Wt 167 lb (75.751 kg)  SpO2 98% VS noted, mild ill, nontoxic Constitutional: Pt appears in no apparent distress HENT:  Head: NCAT.  Right Ear: External ear normal.  Left Ear: External ear normal.  Eyes: . Pupils are equal, round, and reactive to light. Conjunctivae and EOM are normal Neck: Normal range of motion. Neck supple.  Bilat tm's with mild erythema.  Max sinus areas non tender.  Pharynx with mild erythema, no exudate Cardiovascular: Normal rate and regular rhythm.   Pulmonary/Chest: Effort normal and breath sounds without rales or wheezing.  Abd:  Soft, ND, + BS with diffuse mild  tender, no guarding or rebound Neurological: Pt is alert. Not confused , motor grossly intact Skin: Skin is warm. No rash, no LE edema Psychiatric: Pt behavior is normal. No agitation.     Assessment & Plan:

## 2016-02-14 NOTE — Patient Instructions (Addendum)
You had the nausea shot today in the office as it appears you have a "viral gastroenteritis"  Please take all new medication as prescribed - the zofran (generic) for nausea  You can also try the immodium OTC as well  OK for clear liquids now, then for the "BRAT" type diet (bland diet) until you can take other food  (bananas, rice, applesauce, toast)  Please continue all other medications as before, and refills have been done if requested.  Please have the pharmacy call with any other refills you may need.  Please keep your appointments with your specialists as you may have planned  You are given the work note today  Please go to the LAB in the Basement (turn left off the elevator) for the tests to be done today, to make sure of the blood counts and kidney function  If your diarrhea gets worse or more painful, please call for getting stool tests done as well  You will be contacted by phone if any changes need to be made immediately.  Otherwise, you will receive a letter about your results with an explanation, but please check with MyChart first.  Please remember to sign up for MyChart if you have not done so, as this will be important to you in the future with finding out test results, communicating by private email, and scheduling acute appointments online when needed.

## 2016-02-14 NOTE — Telephone Encounter (Signed)
Pt called back to follow up on this request for pain medication. He states he is in extreme pain

## 2016-02-15 NOTE — Telephone Encounter (Signed)
I have rx the bentyl for pain to help with the spasms, o/w medication such as narcotic pain medication would be considered inappropriate for a viral gastroenteritis, and is not normally handled this way  Pt should consider ER if pain to him is getting worse, or having fever, chills, n/v, worsening diarrhea or blood, severe weakness or any falls

## 2016-02-15 NOTE — Telephone Encounter (Signed)
Pt advised in detail via personal VM 

## 2016-02-15 NOTE — Telephone Encounter (Signed)
You saw pt yesterday. Do you think pain medication is necessary for pt?

## 2016-02-15 NOTE — Telephone Encounter (Signed)
Pt was wondering if Bentyl is okey to take with BP med and ulcer med? Please call him back

## 2016-02-15 NOTE — Telephone Encounter (Signed)
Pt advised that Bentyl with current medications are okay to take

## 2016-02-16 NOTE — Assessment & Plan Note (Signed)
stable overall by history and exam, recent data reviewed with pt, and pt to continue medical treatment as before,  to f/u any worsening symptoms or concerns BP Readings from Last 3 Encounters:  02/14/16 132/76  12/29/15 124/80  12/12/15 142/88

## 2016-02-16 NOTE — Assessment & Plan Note (Addendum)
With freq watery stools, likely viral, very much doubt c diff,  for trial immodium prn, consider lomotil, for labs including cbc, bmp , consider stool studies if persists or worsens,  to f/u any worsening symptoms or concerns

## 2016-02-16 NOTE — Assessment & Plan Note (Signed)
Lab Results  Component Value Date   HGBA1C 5.7 10/24/2015   stable overall by history and exam, recent data reviewed with pt, and pt to continue medical treatment as before,  to f/u any worsening symptoms or concerns, pt to call for worsening polys

## 2016-02-16 NOTE — Assessment & Plan Note (Signed)
With subjective fever, abd pain and diarrhea most c/w likely viral illness, c/w acute gastroenteritis, for clear liquids, anti-emetic prn, bentyl prn cramping pain, advance diet as tolerated, gave work note, to avoid dehdyration with pushing fluids as needed, ok for tylenol prn as well

## 2016-02-27 ENCOUNTER — Encounter: Payer: Self-pay | Admitting: Internal Medicine

## 2016-02-27 ENCOUNTER — Ambulatory Visit (INDEPENDENT_AMBULATORY_CARE_PROVIDER_SITE_OTHER): Payer: Self-pay | Admitting: Internal Medicine

## 2016-02-27 VITALS — BP 140/80 | HR 72 | Ht 66.0 in | Wt 170.8 lb

## 2016-02-27 DIAGNOSIS — IMO0001 Reserved for inherently not codable concepts without codable children: Secondary | ICD-10-CM

## 2016-02-27 DIAGNOSIS — K219 Gastro-esophageal reflux disease without esophagitis: Secondary | ICD-10-CM

## 2016-02-27 NOTE — Progress Notes (Signed)
   Subjective:    Patient ID: Cory [name], male    DOB: 1966-05-05, 50 y.o.   MRN: [id number] Chief complaint: I am not having any problems now HPI Patient was seen in primary care in February was some heartburn and indigestion and was a question of dysphagia though the patient says he never had any. Those symptoms lasted a few days. Then he had a gastroenteritis recently was all recovered from that as well as. He says he feels completely well right now and "is not sure why I am here".   Review of Systems     Objective:   Physical Exam BP 140/80 mmHg  Pulse 72  Ht 5\' 6"  (1.676 m)  Wt 170 lb 12.8 oz (77.474 kg)  BMI 27.58 kg/m2 No acute distress    Assessment & Plan:  It seems like he had some transient symptoms which are all resolved. He feels back to normal and essentially does not think he needs any particular care. It seems like at that time the referral made sense but these all better with some transient symptoms so he will see me as needed and I did not charge him for this visit today.

## 2016-02-27 NOTE — Patient Instructions (Signed)
  Glad your better.   Follow up with [place] as needed.     I appreciate the opportunity to care for you.

## 2016-05-20 ENCOUNTER — Ambulatory Visit: Payer: 59 | Admitting: Family

## 2016-06-18 ENCOUNTER — Other Ambulatory Visit: Payer: Self-pay | Admitting: Family

## 2016-06-18 DIAGNOSIS — E039 Hypothyroidism, unspecified: Secondary | ICD-10-CM

## 2016-06-18 DIAGNOSIS — E785 Hyperlipidemia, unspecified: Secondary | ICD-10-CM

## 2016-07-23 ENCOUNTER — Ambulatory Visit (HOSPITAL_COMMUNITY)
Admission: EM | Admit: 2016-07-23 | Discharge: 2016-07-23 | Disposition: A | Discharge status: Home / Self Care | Payer: 59 | Attending: Emergency Medicine | Admitting: Emergency Medicine

## 2016-07-23 ENCOUNTER — Encounter (HOSPITAL_COMMUNITY): Payer: Self-pay | Admitting: *Deleted

## 2016-07-23 DIAGNOSIS — B070 Plantar wart: Secondary | ICD-10-CM | POA: Diagnosis not present

## 2016-07-23 MED ORDER — IBUPROFEN 800 MG PO TABS: 800.0000 mg | ORAL_TABLET | Freq: Three times a day (TID) | ORAL | 0 refills | Status: DC

## 2016-07-23 NOTE — Discharge Instructions (Signed)
Donut corn pads to help pad the areas, continue salicylic acid, [id number] mg ibuprofen with 1 g of Tylenol up to 3 times a day as needed for pain. [facility] to have this definitively taken care of.

## 2016-07-23 NOTE — ED Triage Notes (Addendum)
PT  HAS    FOOT     PROBLEMS       HAS    2  LESIONS  ON THE BOTTOM OF  HIS  R  FOOT       HE HAS   PAIN    ON  WEIGHT BEARING  HE  HAS  HAD  ONE  LESION  FOR  2  WEEKS  ANT THE  OTHER  FOR  ABOUT  3

## 2016-07-23 NOTE — ED Provider Notes (Signed)
HPI  SUBJECTIVE:  Cory [name] is a 50 y.o. male who presents with 2 painful lesions on his right foot. States the one on his heels started 3 weeks ago, and one on the ball of his foot started 2 weeks ago. He states the pain is intermittent, sharp with walking and weightbearing. It is not present at rest. He tried Clorox, absent salt, OTC salicylic acid. No alleviating factors. Symptoms are worse weightbearing and walking. No fevers, rash elsewhere, trauma to the area. He denies stepping on any foreign bodies. No swelling, erythema. No numbness, tingling, foreign body sensation. He has a past medical history of CHF, hypercholesterolemia, hypertension. He is a smoker. He denies any history of diabetes, peripheral neuropathy, HIV, steroid use, immunocompromise. PMD: Dr. Buford Dresser  At [facility] [place].    Past Medical History:  Diagnosis Date  . Carpal tunnel syndrome of right wrist   . CHF (congestive heart failure) ([place])   . Cholelithiasis   . Colon polyps 09/09/2013   hyperplastic  . Condyloma   . Diverticulosis 09/09/2013  . ED (erectile dysfunction)   . GERD (gastroesophageal reflux disease)   . Hyperglycemia 04/14/2011  . Hyperlipidemia   . Hypertension   . Hypothyroidism   . Mild stage glaucoma    Dr Ricki Miller  . Tobacco abuse     Past Surgical History:  Procedure Laterality Date  . COLONOSCOPY W/ BIOPSIES AND POLYPECTOMY  09/09/2013   hyperplastic polyps    Family History  Problem Relation Age of Onset  . Breast cancer Mother     deceased at age 15 secondary to complications of breast cancer  . Diabetes Father   . Stroke Father   . Glaucoma Father   . Hypertension Father   . Blindness Father     leagally blind  . Heart disease Father   . Diabetes Brother   . Alcohol abuse Brother   . Seizures Brother   . Other Neg Hx     prostate cancer, colon cancer    Social History  Substance Use Topics  . Smoking status: Current Every Day Smoker    Packs/day: 0.75    Years:  38.00    Types: Cigarettes  . Smokeless tobacco: Never Used     Comment: "light" smoker since age 67  . Alcohol use 3.6 oz/week    6 Cans of beer per week     Comment: 22 ounces daily    No current facility-administered medications for this encounter.   Current Outpatient Prescriptions:  .  dorzolamide (TRUSOPT) 2 % ophthalmic solution, , Disp: , Rfl:  .  dorzolamide-timolol (COSOPT) 22.3-6.8 MG/ML ophthalmic solution, Place 1 drop into both eyes daily., Disp: 10 mL, Rfl: 2 .  ibuprofen (ADVIL,MOTRIN) 800 MG tablet, Take 1 tablet (800 mg total) by mouth 3 (three) times daily., Disp: 30 tablet, Rfl: 0 .  latanoprost (XALATAN) 0.005 % ophthalmic solution, Place 1 drop into both eyes 2 (two) times daily., Disp: 2.5 mL, Rfl: 2 .  levothyroxine (SYNTHROID, LEVOTHROID) 125 MCG tablet, Take 1 tablet (125 mcg total) by mouth daily., Disp: 90 tablet, Rfl: 0 .  levothyroxine (SYNTHROID, LEVOTHROID) 125 MCG tablet, TAKE ONE TABLET BY MOUTH DAILY, Disp: 30 tablet, Rfl: 2 .  lisinopril (PRINIVIL,ZESTRIL) 5 MG tablet, TAKE ONE TABLET BY MOUTH  DAILY, Disp: 90 tablet, Rfl: 0 .  ondansetron (ZOFRAN) 4 MG tablet, Take 1 tablet (4 mg total) by mouth every 8 (eight) hours as needed for nausea or vomiting., Disp: 40 tablet, Rfl: 1 .  pravastatin (PRAVACHOL) 40 MG tablet, TAKE ONE TABLET BY MOUTH ONCE DAILY, Disp: 30 tablet, Rfl: 2  No Known Allergies   ROS  As noted in HPI.   Physical Exam  BP 168/91 (BP Location: Left Arm)   Pulse 62   Temp 98.8 F (37.1 C) (Oral)   Resp 15   SpO2 100%   Constitutional: Well developed, well nourished, no acute distress Eyes:  EOMI, conjunctiva normal bilaterally HENT: Normocephalic, atraumatic,mucus membranes moist Respiratory: Normal inspiratory effort Cardiovascular: Normal rate GI: nondistended skin: Hard, nontender mass is a with a plantars wart measuring approximately 0.5 cm on the ball of his foot with no surrounding erythema, edema. Similar lesion on  the heel of his right foot with no surrounding erythema or edema. Sensation otherwise intact over rest of foot. Skin otherwise intact. Musculoskeletal: no deformities Neurologic: Alert & oriented x 3, no focal neuro deficits Psychiatric: Speech and behavior appropriate   ED Course   Medications - No data to display  Orders Placed This Encounter  Procedures  . Ambulatory referral to Podiatry    Referral Priority:   Routine    Referral Type:   Consultation    Referral Reason:   Specialty Services Required    Requested Specialty:   Podiatry    Number of Visits Requested:   1    No results found for this or any previous visit (from the past 24 hour(s)). No results found.  ED Clinical Impression  Plantar warts   ED Assessment/Plan  Presentation most consistent with 2 plantars warts. No evidence of cellulitis, abscess, retained foreign body. Advised donut corn Pads, continue salicylic acid, [facility] have these frozen off, 800 mg ibuprofen and a 2 day work note.  Discussed  MDM, plan and followup with patient.  Patient agrees with plan.   *This clinic note was created using Dragon dictation software. Therefore, there may be occasional mistakes despite careful proofreading.  ?   Melynda Ripple, MD 07/23/16 [phone]

## 2016-07-25 ENCOUNTER — Ambulatory Visit: Payer: 59 | Admitting: Family

## 2016-07-25 ENCOUNTER — Ambulatory Visit: Payer: 59 | Admitting: Podiatry

## 2016-09-18 ENCOUNTER — Other Ambulatory Visit: Payer: Self-pay | Admitting: Family

## 2017-01-23 ENCOUNTER — Other Ambulatory Visit: Payer: Self-pay | Admitting: Family

## 2017-01-23 DIAGNOSIS — E039 Hypothyroidism, unspecified: Secondary | ICD-10-CM

## 2017-01-26 ENCOUNTER — Other Ambulatory Visit: Payer: Self-pay | Admitting: Family

## 2017-01-26 DIAGNOSIS — E039 Hypothyroidism, unspecified: Secondary | ICD-10-CM

## 2017-02-04 ENCOUNTER — Telehealth: Payer: Self-pay | Admitting: Family

## 2017-02-04 ENCOUNTER — Other Ambulatory Visit: Payer: Self-pay

## 2017-02-04 DIAGNOSIS — E039 Hypothyroidism, unspecified: Secondary | ICD-10-CM

## 2017-02-04 MED ORDER — LEVOTHYROXINE SODIUM 125 MCG PO TABS: 125.0000 ug | ORAL_TABLET | Freq: Every day | ORAL | 0 refills | Status: DC

## 2017-02-05 ENCOUNTER — Other Ambulatory Visit (INDEPENDENT_AMBULATORY_CARE_PROVIDER_SITE_OTHER): Payer: 59

## 2017-02-05 DIAGNOSIS — E039 Hypothyroidism, unspecified: Secondary | ICD-10-CM | POA: Diagnosis not present

## 2017-02-05 LAB — TSH: TSH: 25.81 u[IU]/mL — ABNORMAL HIGH (ref 0.35–4.50)

## 2017-02-10 ENCOUNTER — Ambulatory Visit (INDEPENDENT_AMBULATORY_CARE_PROVIDER_SITE_OTHER): Payer: BLUE CROSS/BLUE SHIELD | Admitting: Family

## 2017-02-10 ENCOUNTER — Encounter: Payer: Self-pay | Admitting: Family

## 2017-02-10 ENCOUNTER — Other Ambulatory Visit: Payer: 59

## 2017-02-10 ENCOUNTER — Other Ambulatory Visit: Payer: Self-pay | Admitting: Emergency Medicine

## 2017-02-10 ENCOUNTER — Other Ambulatory Visit (INDEPENDENT_AMBULATORY_CARE_PROVIDER_SITE_OTHER): Payer: BLUE CROSS/BLUE SHIELD

## 2017-02-10 VITALS — BP 118/82 | HR 87 | Temp 98.7°F | Ht 65.0 in | Wt 174.0 lb

## 2017-02-10 DIAGNOSIS — E785 Hyperlipidemia, unspecified: Secondary | ICD-10-CM

## 2017-02-10 DIAGNOSIS — E039 Hypothyroidism, unspecified: Secondary | ICD-10-CM

## 2017-02-10 DIAGNOSIS — I10 Essential (primary) hypertension: Secondary | ICD-10-CM | POA: Diagnosis not present

## 2017-02-10 DIAGNOSIS — E782 Mixed hyperlipidemia: Secondary | ICD-10-CM | POA: Diagnosis not present

## 2017-02-10 LAB — CBC WITH DIFFERENTIAL/PLATELET
BASOS ABS: 0 10*3/uL (ref 0.0–0.1)
BASOS PCT: 0.6 % (ref 0.0–3.0)
Eosinophils Absolute: 0.2 10*3/uL (ref 0.0–0.7)
Eosinophils Relative: 2.4 % (ref 0.0–5.0)
HEMATOCRIT: 44.7 % (ref 39.0–52.0)
HEMOGLOBIN: 14.7 g/dL (ref 13.0–17.0)
LYMPHS ABS: 1.6 10*3/uL (ref 0.7–4.0)
Lymphocytes Relative: 23.7 % (ref 12.0–46.0)
MCHC: 32.9 g/dL (ref 30.0–36.0)
MCV: 88.2 fl (ref 78.0–100.0)
MONO ABS: 0.6 10*3/uL (ref 0.1–1.0)
Monocytes Relative: 8.4 % (ref 3.0–12.0)
NEUTROS ABS: 4.4 10*3/uL (ref 1.4–7.7)
Neutrophils Relative %: 64.9 % (ref 43.0–77.0)
PLATELETS: 243 10*3/uL (ref 150.0–400.0)
RBC: 5.07 Mil/uL (ref 4.22–5.81)
RDW: 14 % (ref 11.5–15.5)
WBC: 6.7 10*3/uL (ref 4.0–10.5)

## 2017-02-10 LAB — BASIC METABOLIC PANEL
BUN: 11 mg/dL (ref 6–23)
CHLORIDE: 107 meq/L (ref 96–112)
CO2: 28 mEq/L (ref 19–32)
CREATININE: 1.28 mg/dL (ref 0.40–1.50)
Calcium: 9 mg/dL (ref 8.4–10.5)
GFR: 75.61 mL/min (ref >60.00)
GLUCOSE: 106 mg/dL — AB (ref 70–99)
POTASSIUM: 4.6 meq/L (ref 3.5–5.1)
Sodium: 138 mEq/L (ref 135–145)

## 2017-02-10 LAB — LIPID PANEL
CHOLESTEROL: 187 mg/dL (ref 0–200)
HDL: 67.4 mg/dL (ref >39.00)
LDL Cholesterol: 111 mg/dL — ABNORMAL HIGH (ref 0–99)
NONHDL: 119.83
Total CHOL/HDL Ratio: 3
Triglycerides: 44 mg/dL (ref 0.0–149.0)
VLDL: 8.8 mg/dL (ref 0.0–40.0)

## 2017-02-10 LAB — HEPATIC FUNCTION PANEL
ALK PHOS: 48 U/L (ref 39–117)
ALT: 22 U/L (ref 0–53)
AST: 22 U/L (ref 0–37)
Albumin: 3.9 g/dL (ref 3.5–5.2)
BILIRUBIN DIRECT: 0.1 mg/dL (ref 0.0–0.3)
TOTAL PROTEIN: 7 g/dL (ref 6.0–8.3)
Total Bilirubin: 0.4 mg/dL (ref 0.2–1.2)

## 2017-02-10 MED ORDER — LEVOTHYROXINE SODIUM 125 MCG PO TABS: 125.0000 ug | ORAL_TABLET | Freq: Every day | ORAL | 0 refills | Status: DC

## 2017-02-10 MED ORDER — LISINOPRIL 5 MG PO TABS: ORAL_TABLET | ORAL | 1 refills | Status: DC

## 2017-02-10 MED ORDER — PRAVASTATIN SODIUM 40 MG PO TABS: 40.0000 mg | ORAL_TABLET | Freq: Every day | ORAL | 1 refills | Status: DC

## 2017-02-10 NOTE — Telephone Encounter (Signed)
Pt came early for appt to have blood work done while fasting. Labs entered.

## 2017-02-10 NOTE — Patient Instructions (Signed)
Thank you for choosing [facility].  SUMMARY AND INSTRUCTIONS:  Please continue to take your medications as prescribed.  Follow low sodium. Avoid saturated fats and processed/sugary foods.  Follow up in 6 weeks for a blood test.   Medication:  Your prescription(s) have been submitted to your pharmacy or been printed and provided for you. Please take as directed and contact our office if you believe you are having problem(s) with the medication(s) or have any questions.  Labs:  Please stop by the lab on the lower level of the building for your blood work. Your results will be released to [place] (or called to you) after review, usually within 72 hours after test completion. If any changes need to be made, you will be notified at that same time.  1.) The lab is open from 7:30am to 5:30 pm Monday-Friday 2.) No appointment is necessary 3.) Fasting (if needed) is 6-8 hours after food and drink; black coffee and water are okay   Imaging / Radiology:  Please stop by radiology on the basement level of the building for your x-rays. Your results will be released to [place] (or called to you) after review, usually within 72 hours after test completion. If any treatments or changes are necessary, you will be notified at that same time.  Referrals:  Referrals have been made during this visit. You should expect to hear back from our schedulers in about 7-10 days in regards to establishing an appointment with the specialists we discussed.   Follow up:  If your symptoms worsen or fail to improve, please contact our office for further instruction, or in case of emergency go directly to the emergency room at the closest medical facility.

## 2017-02-10 NOTE — Progress Notes (Signed)
Subjective:    Patient ID: Cory [name], male    DOB: 1966-06-01, 51 y.o.   MRN: [id number]  Chief Complaint  Patient presents with  . Hypertension  . Hyperlipidemia  . Hypothyroidism    HPI:  Cory [name] is a 51 y.o. male who  has a past medical history of Carpal tunnel syndrome of right wrist; CHF (congestive heart failure) ([place]); Cholelithiasis; Colon polyps (09/09/2013); Condyloma; Diverticulosis (09/09/2013); ED (erectile dysfunction); GERD (gastroesophageal reflux disease); Hyperglycemia (04/14/2011); Hyperlipidemia; Hypertension; Hypothyroidism; Mild stage glaucoma(365.71); and Tobacco abuse. and presents today for a follow up office visit.  1.) Hypertension - Currently maintained on lisinopril and reports taking the medications as prescribed and denies adverse side effects or hypotensive readings. Does not currently check blood pressure at home. Denies changes in vision, worst headache of life or new symptoms of end organ damage. Working on following a low sodium diet. Physical activity is minimal.   BP Readings from Last 3 Encounters:  02/10/17 118/82  07/23/16 168/91  02/27/16 140/80    2.) Hyperlipidemia - Currently maintained on pravastatin. Reports taking the medication as prescribed and denies adverse side effects or myalgias. Denies any cardiac symptoms.   Lab Results  Component Value Date   CHOL 187 02/10/2017   HDL 67.40 02/10/2017   LDLCALC 111 (H) 02/10/2017   LDLDIRECT 155.6 02/15/2008   TRIG 44.0 02/10/2017   CHOLHDL 3 02/10/2017    3.) Hypothyroidism - Currently maintained on levothyroxine. Has been out of the medication for several weeks and completed his last A1c about1 week ago with a TSH of 25.81. Denies fatigue, temperature intolerance, or changes to skin/hair/nails.   Lab Results  Component Value Date   TSH 25.81 (H) 02/05/2017      No Known Allergies    Outpatient Medications Prior to Visit  Medication Sig Dispense Refill  .  dorzolamide (TRUSOPT) 2 % ophthalmic solution     . dorzolamide-timolol (COSOPT) 22.3-6.8 MG/ML ophthalmic solution Place 1 drop into both eyes daily. 10 mL 2  . ibuprofen (ADVIL,MOTRIN) 800 MG tablet Take 1 tablet (800 mg total) by mouth 3 (three) times daily. 30 tablet 0  . latanoprost (XALATAN) 0.005 % ophthalmic solution Place 1 drop into both eyes 2 (two) times daily. 2.5 mL 2  . levothyroxine (SYNTHROID, LEVOTHROID) 125 MCG tablet Take 1 tablet (125 mcg total) by mouth daily. 90 tablet 0  . levothyroxine (SYNTHROID, LEVOTHROID) 125 MCG tablet Take 1 tablet (125 mcg total) by mouth daily. 30 tablet 0  . lisinopril (PRINIVIL,ZESTRIL) 5 MG tablet TAKE ONE TABLET BY MOUTH  DAILY 90 tablet 0  . lisinopril (PRINIVIL,ZESTRIL) 5 MG tablet TAKE ONE TABLET BY MOUTH  DAILY 30 tablet 2  . ondansetron (ZOFRAN) 4 MG tablet Take 1 tablet (4 mg total) by mouth every 8 (eight) hours as needed for nausea or vomiting. 40 tablet 1  . pravastatin (PRAVACHOL) 40 MG tablet TAKE ONE TABLET BY MOUTH ONCE DAILY 30 tablet 2   No facility-administered medications prior to visit.       Past Surgical History:  Procedure Laterality Date  . COLONOSCOPY W/ BIOPSIES AND POLYPECTOMY  09/09/2013   hyperplastic polyps      Past Medical History:  Diagnosis Date  . Carpal tunnel syndrome of right wrist   . CHF (congestive heart failure) ([place])   . Cholelithiasis   . Colon polyps 09/09/2013   hyperplastic  . Condyloma   . Diverticulosis 09/09/2013  . ED (erectile dysfunction)   .  GERD (gastroesophageal reflux disease)   . Hyperglycemia 04/14/2011  . Hyperlipidemia   . Hypertension   . Hypothyroidism   . Mild stage glaucoma(365.71)    Dr Ricki Miller  . Tobacco abuse     Review of Systems  Constitutional: Negative for chills, fatigue, fever and unexpected weight change.  Eyes:       Negative for changes in vision  Respiratory: Negative for cough, chest tightness and wheezing.   Cardiovascular: Negative for  chest pain, palpitations and leg swelling.  Endocrine: Negative for cold intolerance and heat intolerance.  Neurological: Negative for dizziness, weakness and light-headedness.      Objective:    BP 118/82   Pulse 87   Temp 98.7 F (37.1 C) (Oral)   Ht 5\' 5"  (1.651 m)   Wt 174 lb (78.9 kg)   SpO2 100%   BMI 28.96 kg/m  Nursing note and vital signs reviewed.  Physical Exam  Constitutional: He is oriented to person, place, and time. He appears well-developed and well-nourished. No distress.  Neck: Neck supple. No thyromegaly present.  Cardiovascular: Normal rate, regular rhythm, normal heart sounds and intact distal pulses.  Exam reveals no gallop and no friction rub.   No murmur heard. Pulmonary/Chest: Effort normal and breath sounds normal. No respiratory distress. He has no wheezes. He has no rales. He exhibits no tenderness.  Lymphadenopathy:    He has no cervical adenopathy.  Neurological: He is alert and oriented to person, place, and time.  Skin: Skin is warm and dry.  Psychiatric: He has a normal mood and affect. His behavior is normal. Judgment and thought content normal.       Assessment & Plan:   Problem List Items Addressed This Visit      Cardiovascular and Mediastinum   Essential hypertension    Blood pressure well-controlled and below goal 140/90 with current medication regimen and no adverse side effects. Denies worst headache of life with no new symptoms of end organ damage noted on physical exam. Encouraged to follow low-sodium diet and monitor blood pressure at home. Continue to monitor.      Relevant Medications   pravastatin (PRAVACHOL) 40 MG tablet   lisinopril (PRINIVIL,ZESTRIL) 5 MG tablet     Endocrine   Hypothyroidism - Primary    TSH remains elevated secondary to patient not taking medication due to running out of it. No current symptoms. Refill levothyroxine. Follow-up in 6 weeks for TSH. Continue to monitor.      Relevant Medications    levothyroxine (SYNTHROID, LEVOTHROID) 125 MCG tablet   Other Relevant Orders   TSH     Other   Hyperlipidemia    Hyperlipidemia appears stable with current medication regimen and no adverse side effects or myalgias. HDL significantly improved and LDL lowered. Remains slightly elevated above goal 100. Continue current dosage of pravastatin. Encouraged to follow a nutritional intake that is moderate, varied, and balanced and low in saturated fats and processed/sugary foods. Continue to monitor.      Relevant Medications   pravastatin (PRAVACHOL) 40 MG tablet   lisinopril (PRINIVIL,ZESTRIL) 5 MG tablet       I have discontinued Mr. Arceneaux's ondansetron. I have also changed his pravastatin. Additionally, I am having him maintain his dorzolamide-timolol, latanoprost, dorzolamide, ibuprofen, lisinopril, and levothyroxine.   Meds ordered this encounter  Medications  . pravastatin (PRAVACHOL) 40 MG tablet    Sig: Take 1 tablet (40 mg total) by mouth daily.    Dispense:  90 tablet  Refill:  1    Order Specific Question:   Supervising Provider    Answer:   Pricilla Holm A [[id number]]  . lisinopril (PRINIVIL,ZESTRIL) 5 MG tablet    Sig: TAKE ONE TABLET BY MOUTH  DAILY    Dispense:  90 tablet    Refill:  1    Order Specific Question:   Supervising Provider    Answer:   Pricilla Holm A [[id number]]  . levothyroxine (SYNTHROID, LEVOTHROID) 125 MCG tablet    Sig: Take 1 tablet (125 mcg total) by mouth daily.    Dispense:  90 tablet    Refill:  0    Order Specific Question:   Supervising Provider    Answer:   Pricilla Holm A [[id number]]     Follow-up: Return in about 6 weeks (around 03/24/2017), or if symptoms worsen or fail to improve.  Mauricio Po, FNP

## 2017-02-10 NOTE — Assessment & Plan Note (Signed)
Hyperlipidemia appears stable with current medication regimen and no adverse side effects or myalgias. HDL significantly improved and LDL lowered. Remains slightly elevated above goal 100. Continue current dosage of pravastatin. Encouraged to follow a nutritional intake that is moderate, varied, and balanced and low in saturated fats and processed/sugary foods. Continue to monitor.

## 2017-02-10 NOTE — Assessment & Plan Note (Signed)
TSH remains elevated secondary to patient not taking medication due to running out of it. No current symptoms. Refill levothyroxine. Follow-up in 6 weeks for TSH. Continue to monitor.

## 2017-02-10 NOTE — Progress Notes (Signed)
Pre visit review using our clinic review tool, if applicable. No additional management support is needed unless otherwise documented below in the visit note. 

## 2017-02-10 NOTE — Assessment & Plan Note (Signed)
Blood pressure well-controlled and below goal 140/90 with current medication regimen and no adverse side effects. Denies worst headache of life with no new symptoms of end organ damage noted on physical exam. Encouraged to follow low-sodium diet and monitor blood pressure at home. Continue to monitor.

## 2017-02-26 DIAGNOSIS — H401131 Primary open-angle glaucoma, bilateral, mild stage: Secondary | ICD-10-CM | POA: Diagnosis not present

## 2017-03-19 DIAGNOSIS — L259 Unspecified contact dermatitis, unspecified cause: Secondary | ICD-10-CM | POA: Diagnosis not present

## 2017-04-08 ENCOUNTER — Encounter: Payer: Self-pay | Admitting: Family

## 2017-04-08 ENCOUNTER — Ambulatory Visit (INDEPENDENT_AMBULATORY_CARE_PROVIDER_SITE_OTHER): Payer: Self-pay | Admitting: Family

## 2017-04-08 ENCOUNTER — Other Ambulatory Visit (INDEPENDENT_AMBULATORY_CARE_PROVIDER_SITE_OTHER): Payer: Self-pay

## 2017-04-08 ENCOUNTER — Telehealth: Payer: Self-pay | Admitting: Family

## 2017-04-08 VITALS — BP 136/82 | HR 67 | Temp 98.6°F | Resp 16 | Ht 65.0 in | Wt 171.0 lb

## 2017-04-08 DIAGNOSIS — E039 Hypothyroidism, unspecified: Secondary | ICD-10-CM

## 2017-04-08 DIAGNOSIS — R200 Anesthesia of skin: Secondary | ICD-10-CM

## 2017-04-08 DIAGNOSIS — B356 Tinea cruris: Secondary | ICD-10-CM | POA: Insufficient documentation

## 2017-04-08 LAB — HEMOGLOBIN A1C: HEMOGLOBIN A1C: 5.9 % (ref 4.6–6.5)

## 2017-04-08 LAB — TSH: TSH: 1.82 u[IU]/mL (ref 0.35–4.50)

## 2017-04-08 LAB — IBC PANEL
IRON: 71 ug/dL (ref 42–165)
Saturation Ratios: 16.3 % — ABNORMAL LOW (ref 20.0–50.0)
Transferrin: 311 mg/dL (ref 212.0–360.0)

## 2017-04-08 MED ORDER — CLOTRIMAZOLE 1 % EX CREA: 1.0000 "application " | TOPICAL_CREAM | Freq: Two times a day (BID) | CUTANEOUS | 0 refills | Status: DC

## 2017-04-08 NOTE — Telephone Encounter (Signed)
Patient states he was to call back to let Marya Amsler know the name of a cream he uses.  States the name is Clobetasol Propionate 5%.   Patient states Marya Amsler was to let him know if he could use this on the current rash he has.

## 2017-04-08 NOTE — Patient Instructions (Addendum)
Thank you for choosing [facility].  SUMMARY AND INSTRUCTIONS:  We will check your blood work today.   It appears as though it is a fungal infection. Check the cream at home and let me know the name - we may be able to use it.   We will follow up pending blood work.   Continue to take your medication as prescribed.   Medication:  Your prescription(s) have been submitted to your pharmacy or been printed and provided for you. Please take as directed and contact our office if you believe you are having problem(s) with the medication(s) or have any questions.  Labs:  Please stop by the lab on the lower level of the building for your blood work. Your results will be released to [place] (or called to you) after review, usually within 72 hours after test completion. If any changes need to be made, you will be notified at that same time.  1.) The lab is open from 7:30am to 5:30 pm Monday-Friday 2.) No appointment is necessary 3.) Fasting (if needed) is 6-8 hours after food and drink; black coffee and water are okay   Follow up:  If your symptoms worsen or fail to improve, please contact our office for further instruction, or in case of emergency go directly to the emergency room at the closest medical facility.     Body Ringworm Body ringworm is an infection of the skin that often causes a ring-shaped rash. Body ringworm can affect any part of your skin. It can spread easily to others. Body ringworm is also called tinea corporis. What are the causes? This condition is caused by funguses called dermatophytes. The condition develops when these funguses grow out of control on the skin. You can get this condition if you touch a person or animal that has it. You can also get it if you share clothing, bedding, towels, or any other object with an infected person or pet. What increases the risk? This condition is more likely to develop in:  Athletes who often make skin-to-skin contact  with other athletes, such as wrestlers.  People who share equipment and mats.  People with a weakened immune system.  What are the signs or symptoms? Symptoms of this condition include:  Itchy, raised red spots and bumps.  Red scaly patches.  A ring-shaped rash. The rash may have: ? A clear center. ? Scales or red bumps at its center. ? Redness near its borders. ? Dry and scaly skin on or around it.  How is this diagnosed? This condition can usually be diagnosed with a skin exam. A skin scraping may be taken from the affected area and examined under a microscope to see if the fungus is present. How is this treated? This condition may be treated with:  An antifungal cream or ointment.  An antifungal shampoo.  Antifungal medicines. These may be prescribed if your ringworm is severe, keeps coming back, or lasts a long time.  Follow these instructions at home:  Take over-the-counter and prescription medicines only as told by your health care provider.  If you were given an antifungal cream or ointment: ? Use it as told by your health care provider. ? Wash the infected area and dry it completely before applying the cream or ointment.  If you were given an antifungal shampoo: ? Use it as told by your health care provider. ? Leave the shampoo on your body for 3-5 minutes before rinsing.  While you have a rash: ?  Wear loose clothing to stop clothes from rubbing and irritating it. ? Wash or change your bed sheets every night.  If your pet has the same infection, take your pet to see a Animal nutritionist. How is this prevented?  Practice good hygiene.  Wear sandals or shoes in public places and showers.  Do not share personal items with others.  Avoid touching red patches of skin on other people.  Avoid touching pets that have bald spots.  If you touch an animal that has a bald spot, wash your hands. Contact a health care provider if:  Your rash continues to spread after  7 days of treatment.  Your rash is not gone in 4 weeks.  The area around your rash gets red, warm, tender, and swollen. This information is not intended to replace advice given to you by your health care provider. Make sure you discuss any questions you have with your health care provider. Document Released: 10/18/2000 Document Revised: 03/28/2016 Document Reviewed: 08/17/2015 Elsevier Interactive Patient Education  [employer].

## 2017-04-08 NOTE — Assessment & Plan Note (Signed)
Stable with current dosage of levothyroxine and no adverse side effects. Obtain TSH. Continue current dosage of levothyroxine pending TSH results. 

## 2017-04-08 NOTE — Progress Notes (Signed)
Subjective:    Patient ID: Cory [name], male    DOB: 06/26/1966, 51 y.o.   MRN: [id number]  Chief Complaint  Patient presents with  . Numbness and tingling    having numbness and tingling in finger tips, and rash in private area that he would like checked, diabetes check, work note    HPI:  Cory [name] is a 51 y.o. male who  has a past medical history of Carpal tunnel syndrome of right wrist; CHF (congestive heart failure) ([place]); Cholelithiasis; Colon polyps (09/09/2013); Condyloma; Diverticulosis (09/09/2013); ED (erectile dysfunction); GERD (gastroesophageal reflux disease); Hyperglycemia (04/14/2011); Hyperlipidemia; Hypertension; Hypothyroidism; Mild stage glaucoma(365.71); and Tobacco abuse. and presents today for an office visit.   1.) Numbness in hands - This is a new problem. Associated symptoms of numbness and tingling located in his fingertips that has been waxing and waning for a couple of months. No increases in hunger, thirst or urination. Denies neck pain. Eating and drinking well with a regular diet.   2.) Rash - This is a new problem. Associated symptom of a rash located in his groin has been going on for several weeks and described as circle shaped. Occasional itchiness. Wears a variety of underwear include boxers and briefs. Denies any attempted treatments or modifying factors.   3.) Hypothyroidism - currently maintained on levothyroxine with previously elevated TSH. Reports taken the medications as prescribed and denies adverse side effects. No temperature intolerance, changes to skin/hair/nails, or fatigue.  Lab Results  Component Value Date   TSH 1.82 04/08/2017    No Known Allergies    Outpatient Medications Prior to Visit  Medication Sig Dispense Refill  . dorzolamide (TRUSOPT) 2 % ophthalmic solution     . dorzolamide-timolol (COSOPT) 22.3-6.8 MG/ML ophthalmic solution Place 1 drop into both eyes daily. 10 mL 2  . latanoprost (XALATAN) 0.005 %  ophthalmic solution Place 1 drop into both eyes 2 (two) times daily. 2.5 mL 2  . levothyroxine (SYNTHROID, LEVOTHROID) 125 MCG tablet Take 1 tablet (125 mcg total) by mouth daily. 90 tablet 0  . lisinopril (PRINIVIL,ZESTRIL) 5 MG tablet TAKE ONE TABLET BY MOUTH  DAILY 90 tablet 1  . pravastatin (PRAVACHOL) 40 MG tablet Take 1 tablet (40 mg total) by mouth daily. 90 tablet 1  . ibuprofen (ADVIL,MOTRIN) 800 MG tablet Take 1 tablet (800 mg total) by mouth 3 (three) times daily. 30 tablet 0   No facility-administered medications prior to visit.       Past Surgical History:  Procedure Laterality Date  . COLONOSCOPY W/ BIOPSIES AND POLYPECTOMY  09/09/2013   hyperplastic polyps      Past Medical History:  Diagnosis Date  . Carpal tunnel syndrome of right wrist   . CHF (congestive heart failure) ([place])   . Cholelithiasis   . Colon polyps 09/09/2013   hyperplastic  . Condyloma   . Diverticulosis 09/09/2013  . ED (erectile dysfunction)   . GERD (gastroesophageal reflux disease)   . Hyperglycemia 04/14/2011  . Hyperlipidemia   . Hypertension   . Hypothyroidism   . Mild stage glaucoma(365.71)    Dr Ricki Miller  . Tobacco abuse       Review of Systems  Constitutional: Negative for chills and fever.  Eyes:       Negative for changes in vision.  Respiratory: Negative for chest tightness and shortness of breath.   Cardiovascular: Negative for chest pain, palpitations and leg swelling.  Endocrine: Negative for polydipsia, polyphagia and polyuria.  Skin: Positive  for rash.  Neurological: Negative for dizziness, weakness, light-headedness and headaches.      Objective:    BP 136/82 (BP Location: Left Arm, Patient Position: Sitting, Cuff Size: Normal)   Pulse 67   Temp 98.6 F (37 C) (Oral)   Resp 16   Ht 5\' 5"  (1.651 m)   Wt 171 lb (77.6 kg)   SpO2 98%   BMI 28.46 kg/m  Nursing note and vital signs reviewed.  Physical Exam  Constitutional: He is oriented to person, place,  and time. He appears well-developed and well-nourished. No distress.  Cardiovascular: Normal rate, regular rhythm, normal heart sounds and intact distal pulses.   Pulmonary/Chest: Effort normal and breath sounds normal.  Neurological: He is alert and oriented to person, place, and time.  Skin: Skin is warm and dry. Rash (Annular rash lcoated in his bilateral groin with darker borders and lighter center. ) noted.  Psychiatric: He has a normal mood and affect. His behavior is normal. Judgment and thought content normal.       Assessment & Plan:   Problem List Items Addressed This Visit      Endocrine   Hypothyroidism    Stable with current dosage of levothyroxine and no adverse side effects. Obtain TSH. Continue current dosage of levothyroxine pending TSH results.        Musculoskeletal and Integument   Tinea cruris - Primary    New-onset rash consistent with tinea cruris. Start clotrimazole. Consider oral medication if symptoms do not improve.       Relevant Medications   clotrimazole (LOTRIMIN) 1 % cream     Other   Numbness of fingers    Numbness of the fingers over undetermined origin with differentials including Type 2 diabetes, vitamin deficiency, cervical stenosis or idiopathic neuropathy. Obtain A1c and IBC panel. Follow up pending lab work results.       Relevant Orders   Hemoglobin A1c (Completed)   TSH (Completed)   IBC panel (Completed)       I have discontinued Mr. Olafson's ibuprofen. I am also having him start on clotrimazole. Additionally, I am having him maintain his dorzolamide-timolol, latanoprost, dorzolamide, pravastatin, lisinopril, and levothyroxine.   Follow-up: Return if symptoms worsen or fail to improve.  Mauricio Po, FNP

## 2017-04-08 NOTE — Telephone Encounter (Signed)
I have sent in a new medication as he needs an antifungal agent as well and that steroid cream may be too potent.

## 2017-04-08 NOTE — Assessment & Plan Note (Signed)
New-onset rash consistent with tinea cruris. Start clotrimazole. Consider oral medication if symptoms do not improve.

## 2017-04-08 NOTE — Assessment & Plan Note (Signed)
Numbness of the fingers over undetermined origin with differentials including Type 2 diabetes, vitamin deficiency, cervical stenosis or idiopathic neuropathy. Obtain A1c and IBC panel. Follow up pending lab work results.

## 2017-04-09 NOTE — Telephone Encounter (Signed)
LVM letting pt know.  

## 2017-06-04 ENCOUNTER — Other Ambulatory Visit: Payer: Self-pay | Admitting: Family

## 2017-08-06 ENCOUNTER — Ambulatory Visit: Payer: Self-pay | Admitting: Family Medicine

## 2017-09-29 ENCOUNTER — Other Ambulatory Visit: Payer: Self-pay | Admitting: Family

## 2017-10-06 ENCOUNTER — Other Ambulatory Visit: Payer: Self-pay | Admitting: Family

## 2017-10-07 ENCOUNTER — Telehealth: Payer: Self-pay | Admitting: Family

## 2017-10-07 ENCOUNTER — Telehealth: Payer: Self-pay | Admitting: Internal Medicine

## 2017-10-07 MED ORDER — LEVOTHYROXINE SODIUM 125 MCG PO TABS: 125.0000 ug | ORAL_TABLET | Freq: Every day | ORAL | 0 refills | Status: DC

## 2017-10-07 MED ORDER — LISINOPRIL 5 MG PO TABS: ORAL_TABLET | ORAL | 0 refills | Status: DC

## 2017-10-07 MED ORDER — PRAVASTATIN SODIUM 40 MG PO TABS: 40.0000 mg | ORAL_TABLET | Freq: Every day | ORAL | 0 refills | Status: DC

## 2017-10-07 NOTE — Telephone Encounter (Signed)
Copied from [place] [id number]. Topic: Quick Communication - See Telephone Encounter >> Oct 07, 2017 12:06 PM Boyd Kerbs wrote: CRM for notification. See Telephone encounter for:  Patient called saying [facility] faxed prescription in to Dr. Lockie Mola and said it was refused.  He is needing refill as he is out of medication.   He is also wanting to see Dr. Jenny Reichmann (he has seen him before, and prefers a man).  He wants to make appt. But need to ask Dr. Jenny Reichmann first.  10/07/17.

## 2017-10-07 NOTE — Telephone Encounter (Signed)
Appt has been set-up for 12/13/17. Per office policy sent  Enough med to local pharmacy until appt...Cory [name]

## 2017-10-07 NOTE — Telephone Encounter (Signed)
Patient has scheduled with Jenny Reichmann for next available.  Patient is requesting refills on levothyroxine, lisinopril and pravastatin

## 2017-10-07 NOTE — Telephone Encounter (Signed)
error 

## 2017-10-08 NOTE — Telephone Encounter (Signed)
error 

## 2017-12-12 ENCOUNTER — Ambulatory Visit: Payer: Self-pay | Admitting: Internal Medicine

## 2017-12-30 ENCOUNTER — Ambulatory Visit: Payer: BLUE CROSS/BLUE SHIELD | Admitting: Internal Medicine

## 2017-12-30 ENCOUNTER — Encounter: Payer: Self-pay | Admitting: Internal Medicine

## 2017-12-30 ENCOUNTER — Other Ambulatory Visit: Payer: Self-pay | Admitting: Internal Medicine

## 2017-12-30 ENCOUNTER — Other Ambulatory Visit (INDEPENDENT_AMBULATORY_CARE_PROVIDER_SITE_OTHER): Payer: BLUE CROSS/BLUE SHIELD

## 2017-12-30 VITALS — BP 142/92 | HR 79 | Temp 98.1°F | Ht 65.0 in | Wt 172.0 lb

## 2017-12-30 DIAGNOSIS — E559 Vitamin D deficiency, unspecified: Secondary | ICD-10-CM | POA: Diagnosis not present

## 2017-12-30 DIAGNOSIS — H6121 Impacted cerumen, right ear: Secondary | ICD-10-CM | POA: Diagnosis not present

## 2017-12-30 DIAGNOSIS — Z0000 Encounter for general adult medical examination without abnormal findings: Secondary | ICD-10-CM

## 2017-12-30 DIAGNOSIS — M79674 Pain in right toe(s): Secondary | ICD-10-CM

## 2017-12-30 DIAGNOSIS — Z1159 Encounter for screening for other viral diseases: Secondary | ICD-10-CM

## 2017-12-30 DIAGNOSIS — K648 Other hemorrhoids: Secondary | ICD-10-CM

## 2017-12-30 DIAGNOSIS — E538 Deficiency of other specified B group vitamins: Secondary | ICD-10-CM

## 2017-12-30 DIAGNOSIS — R739 Hyperglycemia, unspecified: Secondary | ICD-10-CM | POA: Diagnosis not present

## 2017-12-30 LAB — URINALYSIS, ROUTINE W REFLEX MICROSCOPIC
BILIRUBIN URINE: NEGATIVE
HGB URINE DIPSTICK: NEGATIVE
Ketones, ur: NEGATIVE
LEUKOCYTES UA: NEGATIVE
NITRITE: NEGATIVE
RBC / HPF: NONE SEEN (ref 0–?)
Specific Gravity, Urine: >=1.03 — AB (ref 1.000–1.030)
Total Protein, Urine: NEGATIVE
Urine Glucose: NEGATIVE
Urobilinogen, UA: 0.2 (ref 0.0–1.0)
pH: 5.5 (ref 5.0–8.0)

## 2017-12-30 LAB — TSH: TSH: 1.66 u[IU]/mL (ref 0.35–4.50)

## 2017-12-30 LAB — BASIC METABOLIC PANEL WITH GFR
BUN: 13 mg/dL (ref 6–23)
CO2: 30 meq/L (ref 19–32)
Calcium: 10.1 mg/dL (ref 8.4–10.5)
Chloride: 101 meq/L (ref 96–112)
Creatinine, Ser: 1.25 mg/dL (ref 0.40–1.50)
GFR: 77.45 mL/min
Glucose, Bld: 115 mg/dL — ABNORMAL HIGH (ref 70–99)
Potassium: 4.3 meq/L (ref 3.5–5.1)
Sodium: 137 meq/L (ref 135–145)

## 2017-12-30 LAB — LIPID PANEL
Cholesterol: 186 mg/dL (ref 0–200)
HDL: 60.9 mg/dL
LDL Cholesterol: 104 mg/dL — ABNORMAL HIGH (ref 0–99)
NonHDL: 124.95
Total CHOL/HDL Ratio: 3
Triglycerides: 106 mg/dL (ref 0.0–149.0)
VLDL: 21.2 mg/dL (ref 0.0–40.0)

## 2017-12-30 LAB — HEPATIC FUNCTION PANEL
ALBUMIN: 4.3 g/dL (ref 3.5–5.2)
ALT: 25 U/L (ref 0–53)
AST: 20 U/L (ref 0–37)
Alkaline Phosphatase: 59 U/L (ref 39–117)
BILIRUBIN DIRECT: 0.1 mg/dL (ref 0.0–0.3)
TOTAL PROTEIN: 8 g/dL (ref 6.0–8.3)
Total Bilirubin: 0.3 mg/dL (ref 0.2–1.2)

## 2017-12-30 LAB — HEMOGLOBIN A1C: Hgb A1c MFr Bld: 5.9 % (ref 4.6–6.5)

## 2017-12-30 LAB — CBC WITH DIFFERENTIAL/PLATELET
BASOS ABS: 0.1 10*3/uL (ref 0.0–0.1)
Basophils Relative: 1.5 % (ref 0.0–3.0)
Eosinophils Absolute: 0.2 10*3/uL (ref 0.0–0.7)
Eosinophils Relative: 2.1 % (ref 0.0–5.0)
HEMATOCRIT: 45.5 % (ref 39.0–52.0)
HEMOGLOBIN: 15.1 g/dL (ref 13.0–17.0)
LYMPHS ABS: 1.9 10*3/uL (ref 0.7–4.0)
LYMPHS PCT: 26.3 % (ref 12.0–46.0)
MCHC: 33.3 g/dL (ref 30.0–36.0)
MCV: 87.7 fl (ref 78.0–100.0)
MONOS PCT: 8.6 % (ref 3.0–12.0)
Monocytes Absolute: 0.6 10*3/uL (ref 0.1–1.0)
NEUTROS PCT: 61.5 % (ref 43.0–77.0)
Neutro Abs: 4.5 10*3/uL (ref 1.4–7.7)
Platelets: 250 10*3/uL (ref 150.0–400.0)
RBC: 5.19 Mil/uL (ref 4.22–5.81)
RDW: 13.7 % (ref 11.5–15.5)
WBC: 7.4 10*3/uL (ref 4.0–10.5)

## 2017-12-30 LAB — PSA: PSA: 2.37 ng/mL (ref 0.10–4.00)

## 2017-12-30 LAB — VITAMIN B12: Vitamin B-12: 311 pg/mL (ref 211–911)

## 2017-12-30 LAB — VITAMIN D 25 HYDROXY (VIT D DEFICIENCY, FRACTURES): VITD: 9.07 ng/mL — ABNORMAL LOW (ref 30.00–100.00)

## 2017-12-30 MED ORDER — VITAMIN D (ERGOCALCIFEROL) 1.25 MG (50000 UNIT) PO CAPS: 50000.0000 [IU] | ORAL_CAPSULE | ORAL | 0 refills | Status: DC

## 2017-12-30 NOTE — Assessment & Plan Note (Signed)
For a1c with labs

## 2017-12-30 NOTE — Assessment & Plan Note (Signed)
For GI referral back to GI

## 2017-12-30 NOTE — Assessment & Plan Note (Signed)

## 2017-12-30 NOTE — Assessment & Plan Note (Signed)
With mild reduced hearing, pt prefers for home ear wax removal kit use

## 2017-12-30 NOTE — Assessment & Plan Note (Signed)
For podiatry referral 

## 2017-12-30 NOTE — Patient Instructions (Signed)
Your form was filled out today  You will be contacted regarding the referral for: Gastroenterology (Dr Carlean Purl), and Podiatry  Ok to use the Ear Wax Removal Kit at the pharmacy for the right ear  Please continue all other medications as before, and refills have been done if requested.  Please have the pharmacy call with any other refills you may need.  Please continue your efforts at being more active, low cholesterol diet, and weight control.  You are otherwise up to date with prevention measures today.  Please keep your appointments with your specialists as you may have planned  Please go to the LAB in the Basement (turn left off the elevator) for the tests to be done today  You will be contacted by phone if any changes need to be made immediately.  Otherwise, you will receive a letter about your results with an explanation, but please check with MyChart first.  Please remember to sign up for MyChart if you have not done so, as this will be important to you in the future with finding out test results, communicating by private email, and scheduling acute appointments online when needed.  Please return in 1 year for your yearly visit, or sooner if needed, with Lab testing done 3-5 days before

## 2017-12-30 NOTE — Progress Notes (Signed)
Subjective:    Patient ID: Cory [name], male    DOB: 29-May-1966, 52 y.o.   MRN: [id number]  HPI  Here for wellness and f/u;  Overall doing ok;  Pt denies Chest pain, worsening SOB, DOE, wheezing, orthopnea, PND, worsening LE edema, palpitations, dizziness or syncope.  Pt denies neurological change such as new headache, facial or extremity weakness.  Pt denies polydipsia, polyuria, or low sugar symptoms. Pt states overall good compliance with treatment and medications, good tolerability, and has been trying to follow appropriate diet.  Pt denies worsening depressive symptoms, suicidal ideation or panic. No fever, night sweats, wt loss, loss of appetite, or other constitutional symptoms.  Pt states good ability with ADL's, has low fall risk, home safety reviewed and adequate, no other significant changes in hearing or vision, and only occasionally active with exercise. Does have pain to the right 5th toenail on palpation for several months.  Also, Denies worsening reflux, abd pain, dysphagia, n/v, bowel change, but has had intermittent BRBPR likely due to hemorrhoid found on colonoscopy, did not f/u with Dr Carlean Purl at the time for hemorrhoid tx.  Also with reduced hearing right ear for 1 wk, ? Wax.  He is also very concerned about strong FH of DM.  NO other interval hx or new complaints Past Medical History:  Diagnosis Date  . Carpal tunnel syndrome of right wrist   . CHF (congestive heart failure) ([place])   . Cholelithiasis   . Colon polyps 09/09/2013   hyperplastic  . Condyloma   . Diverticulosis 09/09/2013  . ED (erectile dysfunction)   . GERD (gastroesophageal reflux disease)   . Hyperglycemia 04/14/2011  . Hyperlipidemia   . Hypertension   . Hypothyroidism   . Mild stage glaucoma(365.71)    Dr Ricki Miller  . Tobacco abuse    Past Surgical History:  Procedure Laterality Date  . COLONOSCOPY W/ BIOPSIES AND POLYPECTOMY  09/09/2013   hyperplastic polyps    reports that he has been  smoking cigarettes.  He has a 28.50 pack-year smoking history. he has never used smokeless tobacco. He reports that he drinks about 3.6 oz of alcohol per week. He reports that he uses drugs. Drug: Marijuana. family history includes Alcohol abuse in his brother; Blindness in his father; Breast cancer in his mother; Diabetes in his brother and father; Glaucoma in his father; Heart disease in his father; Hypertension in his father; Seizures in his brother; Stroke in his father. No Known Allergies Current Outpatient Medications on File Prior to Visit  Medication Sig Dispense Refill  . dorzolamide (TRUSOPT) 2 % ophthalmic solution     . dorzolamide-timolol (COSOPT) 22.3-6.8 MG/ML ophthalmic solution Place 1 drop into both eyes daily. 10 mL 2  . latanoprost (XALATAN) 0.005 % ophthalmic solution Place 1 drop into both eyes 2 (two) times daily. 2.5 mL 2  . levothyroxine (SYNTHROID, LEVOTHROID) 125 MCG tablet Take 1 tablet (125 mcg total) by mouth daily. Must keep schedule appt w/new provider for future refills 90 tablet 0  . lisinopril (PRINIVIL,ZESTRIL) 5 MG tablet TAKE ONE TABLET BY MOUTH  DAILY 90 tablet 0  . pravastatin (PRAVACHOL) 40 MG tablet Take 1 tablet (40 mg total) by mouth daily. Must keep schedule appt w/new provider for future refills 90 tablet 0   No current facility-administered medications on file prior to visit.    Review of Systems Constitutional: Negative for other unusual diaphoresis, sweats, appetite or weight changes HENT: Negative for other worsening hearing loss, ear pain,  facial swelling, mouth sores or neck stiffness.   Eyes: Negative for other worsening pain, redness or other visual disturbance.  Respiratory: Negative for other stridor or swelling Cardiovascular: Negative for other palpitations or other chest pain  Gastrointestinal: Negative for worsening diarrhea or loose stools, blood in stool, distention or other pain Genitourinary: Negative for hematuria, flank pain or  other change in urine volume.  Musculoskeletal: Negative for myalgias or other joint swelling.  Skin: Negative for other color change, or other wound or worsening drainage.  Neurological: Negative for other syncope or numbness. Hematological: Negative for other adenopathy or swelling Psychiatric/Behavioral: Negative for hallucinations, other worsening agitation, SI, self-injury, or new decreased concentration All other system neg per pt    Objective:   Physical Exam BP (!) 142/92   Pulse 79   Temp 98.1 F (36.7 C) (Oral)   Ht 5\' 5"  (1.651 m)   Wt 172 lb (78 kg)   SpO2 99%   BMI 28.62 kg/m  VS noted,  Constitutional: Pt is oriented to person, place, and time. Appears well-developed and well-nourished, in no significant distress and comfortable Head: Normocephalic and atraumatic  Eyes: Conjunctivae and EOM are normal. Pupils are equal, round, and reactive to light Right Ear: External ear normal without discharge; right wax impaction resolved with irrigation and hearing improved, Left Ear: External ear normal without discharge Nose: Nose without discharge or deformity Mouth/Throat: Oropharynx is without other ulcerations and moist  Neck: Normal range of motion. Neck supple. No JVD present. No tracheal deviation present or significant neck LA or mass Cardiovascular: Normal rate, regular rhythm, normal heart sounds and intact distal pulses.   Pulmonary/Chest: WOB normal and breath sounds without rales or wheezing  Abdominal: Soft. Bowel sounds are normal. NT. No HSM  Musculoskeletal: Normal range of motion. Exhibits no edema Lymphadenopathy: Has no other cervical adenopathy.  Neurological: Pt is alert and oriented to person, place, and time. Pt has normal reflexes. No cranial nerve deficit. Motor grossly intact, Gait intact Skin: Skin is warm and dry. No rash noted or new ulcerations Psychiatric:  Has normal mood and affect. Behavior is normal without agitation No other exam findings     Assessment & Plan:

## 2017-12-31 ENCOUNTER — Telehealth: Payer: Self-pay

## 2017-12-31 LAB — HEPATITIS C ANTIBODY
HEP C AB: NONREACTIVE
SIGNAL TO CUT-OFF: 0.03 (ref <1.00)

## 2017-12-31 NOTE — Telephone Encounter (Signed)
Pt has been informed and expressed understanding.  

## 2017-12-31 NOTE — Telephone Encounter (Signed)
-----   Message from Biagio Borg, MD sent at 12/30/2017  8:00 PM EST ----- Left message on MyChart, pt to cont same tx except  The test results show that your current treatment is OK, except the Vitamin D level is VERY low, and is very important for good bone health.  Please take prescription Vit D at 50000 units weekly for 12 weeks.  After that, please start Vitamin D 2000 units per day (OTC).    Cory [name] to please inform pt, I will do rx

## 2018-01-07 ENCOUNTER — Other Ambulatory Visit: Payer: Self-pay

## 2018-01-07 ENCOUNTER — Ambulatory Visit: Payer: Self-pay | Admitting: *Deleted

## 2018-01-07 ENCOUNTER — Emergency Department (HOSPITAL_COMMUNITY)
Admission: EM | Admit: 2018-01-07 | Discharge: 2018-01-08 | Disposition: A | Discharge status: Home / Self Care | Payer: BLUE CROSS/BLUE SHIELD | Attending: Physician Assistant | Admitting: Physician Assistant

## 2018-01-07 ENCOUNTER — Encounter (HOSPITAL_COMMUNITY): Payer: Self-pay

## 2018-01-07 DIAGNOSIS — T783XXA Angioneurotic edema, initial encounter: Secondary | ICD-10-CM

## 2018-01-07 DIAGNOSIS — I509 Heart failure, unspecified: Secondary | ICD-10-CM | POA: Insufficient documentation

## 2018-01-07 DIAGNOSIS — E039 Hypothyroidism, unspecified: Secondary | ICD-10-CM | POA: Insufficient documentation

## 2018-01-07 DIAGNOSIS — R600 Localized edema: Secondary | ICD-10-CM | POA: Diagnosis present

## 2018-01-07 DIAGNOSIS — I110 Hypertensive heart disease with heart failure: Secondary | ICD-10-CM | POA: Insufficient documentation

## 2018-01-07 DIAGNOSIS — Z79899 Other long term (current) drug therapy: Secondary | ICD-10-CM | POA: Insufficient documentation

## 2018-01-07 DIAGNOSIS — F17210 Nicotine dependence, cigarettes, uncomplicated: Secondary | ICD-10-CM | POA: Insufficient documentation

## 2018-01-07 MED ORDER — FAMOTIDINE IN NACL 20-0.9 MG/50ML-% IV SOLN
20.0000 mg | Freq: Once | INTRAVENOUS | Status: AC
  Administered 2018-01-07: 20 mg via INTRAVENOUS
  Filled 2018-01-07: qty 50

## 2018-01-07 MED ORDER — METHYLPREDNISOLONE SODIUM SUCC 125 MG IJ SOLR
80.0000 mg | Freq: Once | INTRAMUSCULAR | Status: AC
  Administered 2018-01-07: 80 mg via INTRAVENOUS
  Filled 2018-01-07: qty 2

## 2018-01-07 MED ORDER — DIPHENHYDRAMINE HCL 50 MG/ML IJ SOLN
25.0000 mg | Freq: Once | INTRAMUSCULAR | Status: AC
  Administered 2018-01-07: 25 mg via INTRAVENOUS
  Filled 2018-01-07: qty 1

## 2018-01-07 NOTE — Telephone Encounter (Signed)
Pt calling with complaints of having swollen bottom lip that started at approximately 5pm today. Pt states his lip is "pretty swollen" .Pt denies any itching or other symptoms at this time. Pt believed the swelling was due to taking Vit D and states that he took his second dose of the medication on today. Pt also states that he took his daily medications, Lisinopril, Pravachol, Synthroid at the same time as taking the Vit D this am. Explained to the pt that Lisinopril sometimes caused a reaction in which the pt would experience lip swelling. Pt advised to seek treatment in the ED. Pt verbalized understanding and states he will go to the ED.  Reason for Disposition . Taking an ACE Inhibitor medication  (e.g., benazepril/LOTENSIN, captopril/CAPOTEN, enalapril/VASOTEC, lisinopril/ZESTRIL)  Answer Assessment - Initial Assessment Questions 1. ONSET: "When did the swelling start?" (e.g., minutes, hours, days)     Today around 5pm today 2. SEVERITY: "How swollen is it?"     Pretty big 3. ITCHING: "Is there any itching?" If so, ask: "How much?"   (Scale 1-10; mild, moderate or severe)     No 4. PAIN: "Is the swelling painful to touch?" If so, ask: "How painful is it?"   (Scale 1-10; mild, moderate or severe)     No 5. CAUSE: "What do you think is causing the lip swelling?"     Thinks it is from taking vit Dr.  6. RECURRENT SYMPTOM: "Have you had lip swelling before?" If so, ask: "When was the last time?" "What happened that time?"     No 7. OTHER SYMPTOMS: "Do you have any other symptoms?" (e.g., toothache)     no  Protocols used: LIP SWELLING-A-AH

## 2018-01-07 NOTE — ED Provider Notes (Signed)
[facility] EMERGENCY DEPARTMENT Provider Note   CSN: [id number] Arrival date & time: 01/07/18  2000     History   Chief Complaint Chief Complaint  Patient presents with  . Angioedema    HPI Cory [name] is a 52 y.o. male.  HPI   52 year old male presenting today with angioedema.  Patient had left lower lip swelling that started today around 5 PM.  He called his primary care provider provider who sent him here to the emergency department.  Patient is a longtime user of lisinopril.  All only new medications that he started taking vitamin D this week.  Otherwise has had no changes in his life recently.  Past Medical History:  Diagnosis Date  . Carpal tunnel syndrome of right wrist   . CHF (congestive heart failure) ([place])   . Cholelithiasis   . Colon polyps 09/09/2013   hyperplastic  . Condyloma   . Diverticulosis 09/09/2013  . ED (erectile dysfunction)   . GERD (gastroesophageal reflux disease)   . Hyperglycemia 04/14/2011  . Hyperlipidemia   . Hypertension   . Hypothyroidism   . Mild stage glaucoma(365.71)    Dr Ricki Miller  . Tobacco abuse     Patient Active Problem List   Diagnosis Date Noted  . Right ear impacted cerumen 12/30/2017  . Toe pain, right 12/30/2017  . Tinea cruris 04/08/2017  . Numbness of fingers 04/08/2017  . Nausea with vomiting 02/14/2016  . Diarrhea 02/14/2016  . Elbow strain 12/29/2015  . Abdominal tenderness, unspecified site 12/12/2015  . Gastroesophageal reflux disease with esophagitis 12/12/2015  . Dysphagia 12/12/2015  . Numbness and tingling in right hand 10/24/2015  . Muscle cramps 10/24/2015  . Rectal bleeding 07/09/2013  . Preventative health care 03/19/2013  . Hemorrhoids, internal, with bleeding, and prolapse 08/26/2011  . Hyperglycemia 04/14/2011  . CARPAL TUNNEL SYNDROME, RIGHT 01/08/2011  . Essential hypertension 09/17/2010  . CONDYLOMA 07/30/2010  . BACK PAIN 07/23/2010  . HAND PAIN, RIGHT  07/23/2010  . ERECTILE DYSFUNCTION, ORGANIC 03/12/2010  . HOARSENESS 05/12/2009  . Hyperlipidemia 01/07/2008  . TOBACCO ABUSE 01/07/2008  . Hypothyroidism 07/04/2007  . CHOLELITHIASIS 07/04/2007    Past Surgical History:  Procedure Laterality Date  . COLONOSCOPY W/ BIOPSIES AND POLYPECTOMY  09/09/2013   hyperplastic polyps       Home Medications    Prior to Admission medications   Medication Sig Start Date End Date Taking? Authorizing Provider  dorzolamide (TRUSOPT) 2 % ophthalmic solution  12/11/15   [provider]  dorzolamide-timolol (COSOPT) 22.3-6.8 MG/ML ophthalmic solution Place 1 drop into both eyes daily. 10/11/14   Harden Mo, MD  latanoprost (XALATAN) 0.005 % ophthalmic solution Place 1 drop into both eyes 2 (two) times daily. 10/11/14   Harden Mo, MD  levothyroxine (SYNTHROID, LEVOTHROID) 125 MCG tablet Take 1 tablet (125 mcg total) by mouth daily. Must keep schedule appt w/new provider for future refills 10/07/17   Biagio Borg, MD  lisinopril (PRINIVIL,ZESTRIL) 5 MG tablet TAKE ONE TABLET BY MOUTH  DAILY 10/07/17   Biagio Borg, MD  pravastatin (PRAVACHOL) 40 MG tablet Take 1 tablet (40 mg total) by mouth daily. Must keep schedule appt w/new provider for future refills 10/07/17   Biagio Borg, MD  Vitamin D, Ergocalciferol, (DRISDOL) 50000 units CAPS capsule Take 1 capsule (50,000 Units total) by mouth every 7 (seven) days. 12/30/17   Biagio Borg, MD    Family History Family History  Problem Relation Age  of Onset  . Breast cancer Mother        deceased at age 66 secondary to complications of breast cancer  . Diabetes Father   . Stroke Father   . Glaucoma Father   . Hypertension Father   . Blindness Father        leagally blind  . Heart disease Father   . Diabetes Brother   . Alcohol abuse Brother   . Seizures Brother   . Other Neg Hx        prostate cancer, colon cancer    Social History Social History   Tobacco Use  . Smoking  status: Current Every Day Smoker    Packs/day: 0.75    Years: 38.00    Pack years: 28.50    Types: Cigarettes  . Smokeless tobacco: Never Used  . Tobacco comment: "light" smoker since age 26  Substance Use Topics  . Alcohol use: Yes    Alcohol/week: 3.6 oz    Types: 6 Cans of beer per week    Comment: 22 ounces daily  . Drug use: Yes    Types: Marijuana    Comment: OCCASIONAL none since 08/11/14     Allergies   Patient has no known allergies.   Review of Systems Review of Systems  Constitutional: Negative for activity change.  Respiratory: Negative for shortness of breath.   Cardiovascular: Negative for chest pain.  Gastrointestinal: Negative for abdominal pain.     Physical Exam Updated Vital Signs BP (!) 153/97   Pulse 84   Temp 98 F (36.7 C) (Oral)   Resp 18   SpO2 92%   Physical Exam  Constitutional: He is oriented to person, place, and time. He appears well-nourished.  HENT:  Head: Normocephalic.  Left lip swelkling significant.   Back of throat appears normal.   Eyes: Conjunctivae are normal.  Cardiovascular: Normal rate and regular rhythm.  Pulmonary/Chest: Effort normal and breath sounds normal. No respiratory distress.  Abdominal: Soft. He exhibits no distension.  Neurological: He is oriented to person, place, and time.  Skin: Skin is warm and dry. He is not diaphoretic.  Psychiatric: He has a normal mood and affect. His behavior is normal.     ED Treatments / Results  Labs (all labs ordered are listed, but only abnormal results are displayed) Labs Reviewed - No data to display  EKG  EKG Interpretation None       Radiology No results found.  Procedures Procedures (including critical care time)  CRITICAL CARE Performed by: Gardiner Sleeper Total critical care time: 30 minutes Critical care time was exclusive of separately billable procedures and treating other patients. Critical care was necessary to treat or prevent imminent  or life-threatening deterioration. Critical care was time spent personally by me on the following activities: development of treatment plan with patient and/or surrogate as well as nursing, discussions with consultants, evaluation of patient's response to treatment, examination of patient, obtaining history from patient or surrogate, ordering and performing treatments and interventions, ordering and review of laboratory studies, ordering and review of radiographic studies, pulse oximetry and re-evaluation of patient's condition.   Medications Ordered in ED Medications  methylPREDNISolone sodium succinate (SOLU-MEDROL) 125 mg/2 mL injection 80 mg (not administered)  famotidine (PEPCID) IVPB 20 mg premix (not administered)  diphenhydrAMINE (BENADRYL) injection 25 mg (not administered)     Initial Impression / Assessment and Plan / ED Course  I have reviewed the triage vital signs and the nursing notes.  Pertinent  labs & imaging results that were available during my care of the patient were reviewed by me and considered in my medical decision making (see chart for details).     52 year old male presenting today with angioedema.  Patient had left lower lip swelling that started today around 5 PM.  He called his primary care provider provider who sent him here to the emergency department.  Patient is a longtime user of lisinopril.  All only new medications that he started taking vitamin D this week.  Otherwise has had no changes in his life recently.  Suspect angioedema. Will treat with steroids, benadryl and famotidine, though I recongnize the data is not convincing on the efficacy.  Long disucssion about no lisinopril or related medications.   Observed for 4 hours. No wrosening. Will dc with beandryl, follow upw with PCP.   Final Clinical Impressions(s) / ED Diagnoses   Final diagnoses:  None    ED Discharge Orders    None       Macarthur Critchley, MD 01/08/18 2336

## 2018-01-07 NOTE — ED Triage Notes (Signed)
Pt with lower lip swelling since 5pm, on lisinopril, denies SOB, feels throat is swelling a bit

## 2018-01-08 MED ORDER — DIPHENHYDRAMINE HCL 50 MG PO TABS: 50.0000 mg | ORAL_TABLET | Freq: Four times a day (QID) | ORAL | 0 refills | Status: DC | PRN

## 2018-01-08 NOTE — Discharge Instructions (Signed)
You were seen here today with angioedema.  Please never take a medication called lisinopril again.  There are also several medications that are related that we do not want you to take, so make sure you ask your phsyician before taking a blood pressure medication in case it is related.   Please return if your swelling increases or with any concerns.  Please take Benadryl every 6-8 hours as needed for lip swelling for the next 24 hours.

## 2018-01-09 ENCOUNTER — Encounter: Payer: Self-pay | Admitting: Internal Medicine

## 2018-01-09 ENCOUNTER — Ambulatory Visit (INDEPENDENT_AMBULATORY_CARE_PROVIDER_SITE_OTHER): Payer: BLUE CROSS/BLUE SHIELD | Admitting: Internal Medicine

## 2018-01-09 ENCOUNTER — Ambulatory Visit: Payer: BLUE CROSS/BLUE SHIELD | Admitting: Internal Medicine

## 2018-01-09 VITALS — BP 116/82 | HR 80 | Temp 98.4°F | Ht 65.0 in | Wt 171.0 lb

## 2018-01-09 DIAGNOSIS — M79674 Pain in right toe(s): Secondary | ICD-10-CM

## 2018-01-09 DIAGNOSIS — I10 Essential (primary) hypertension: Secondary | ICD-10-CM

## 2018-01-09 DIAGNOSIS — E559 Vitamin D deficiency, unspecified: Secondary | ICD-10-CM

## 2018-01-09 DIAGNOSIS — T783XXD Angioneurotic edema, subsequent encounter: Secondary | ICD-10-CM | POA: Diagnosis not present

## 2018-01-09 DIAGNOSIS — R739 Hyperglycemia, unspecified: Secondary | ICD-10-CM

## 2018-01-09 MED ORDER — PRAVASTATIN SODIUM 40 MG PO TABS: 40.0000 mg | ORAL_TABLET | Freq: Every day | ORAL | 3 refills | Status: DC

## 2018-01-09 MED ORDER — AMLODIPINE BESYLATE 5 MG PO TABS: 5.0000 mg | ORAL_TABLET | Freq: Every day | ORAL | 3 refills | Status: DC

## 2018-01-09 MED ORDER — LEVOTHYROXINE SODIUM 125 MCG PO TABS: 125.0000 ug | ORAL_TABLET | Freq: Every day | ORAL | 3 refills | Status: DC

## 2018-01-09 NOTE — Patient Instructions (Addendum)
Please continue to monitor your Blood Pressure at home with a Blood Pressure cuff for the arm  Your goal is to be less than 130/80, so start the amlodipine for blood pressure if you have at least 3 Blood Pressures more than 130/80  Please continue all other medications as before, and refills have been done if requested.  Please have the pharmacy call with any other refills you may need.  Please keep your appointments with your specialists as you may have planned

## 2018-01-09 NOTE — Progress Notes (Signed)
Subjective:    Patient ID: Cory [name], male    DOB: Aug 30, 1966, 52 y.o.   MRN: [id number]  HPI  Here to f/u 3/6 ED visit with angioedema tx with benadryl and predpac, ACE discontinued and now resolved lower lip swelling.  Pt denies chest pain, increased sob or doe, wheezing, orthopnea, PND, increased LE swelling, palpitations, dizziness or syncope.   Pt denies polydipsia, polyuria.  Has f/u with podiatry soon.   Pt denies fever, wt loss, night sweats, loss of appetite, or other constitutional symptoms  Needs all refills Past Medical History:  Diagnosis Date  . Carpal tunnel syndrome of right wrist   . CHF (congestive heart failure) ([place])   . Cholelithiasis   . Colon polyps 09/09/2013   hyperplastic  . Condyloma   . Diverticulosis 09/09/2013  . ED (erectile dysfunction)   . GERD (gastroesophageal reflux disease)   . Hyperglycemia 04/14/2011  . Hyperlipidemia   . Hypertension   . Hypothyroidism   . Mild stage glaucoma(365.71)    Dr Ricki Miller  . Tobacco abuse    Past Surgical History:  Procedure Laterality Date  . COLONOSCOPY W/ BIOPSIES AND POLYPECTOMY  09/09/2013   hyperplastic polyps    reports that he has been smoking cigarettes.  He has a 28.50 pack-year smoking history. he has never used smokeless tobacco. He reports that he drinks about 3.6 oz of alcohol per week. He reports that he uses drugs. Drug: Marijuana. family history includes Alcohol abuse in his brother; Blindness in his father; Breast cancer in his mother; Diabetes in his brother and father; Glaucoma in his father; Heart disease in his father; Hypertension in his father; Seizures in his brother; Stroke in his father. Allergies  Allergen Reactions  . Ace Inhibitors Swelling    Lip swelling    Current Outpatient Medications on File Prior to Visit  Medication Sig Dispense Refill  . diphenhydrAMINE (BENADRYL) 50 MG tablet Take 1 tablet (50 mg total) by mouth every 6 (six) hours as needed for up to 1 day for  itching (for the next day). 15 tablet 0  . dorzolamide (TRUSOPT) 2 % ophthalmic solution     . dorzolamide-timolol (COSOPT) 22.3-6.8 MG/ML ophthalmic solution Place 1 drop into both eyes daily. 10 mL 2  . latanoprost (XALATAN) 0.005 % ophthalmic solution Place 1 drop into both eyes 2 (two) times daily. 2.5 mL 2  . Vitamin D, Ergocalciferol, (DRISDOL) 50000 units CAPS capsule Take 1 capsule (50,000 Units total) by mouth every 7 (seven) days. 12 capsule 0   No current facility-administered medications on file prior to visit.    Review of Systems  Constitutional: Negative for other unusual diaphoresis or sweats HENT: Negative for ear discharge or swelling Eyes: Negative for other worsening visual disturbances Respiratory: Negative for stridor or other swelling  Gastrointestinal: Negative for worsening distension or other blood Genitourinary: Negative for retention or other urinary change Musculoskeletal: Negative for other MSK pain or swelling Skin: Negative for color change or other new lesions Neurological: Negative for worsening tremors and other numbness  Psychiatric/Behavioral: Negative for worsening agitation or other fatigue All other system neg per pt    Objective:   Physical Exam BP 116/82   Pulse 80   Temp 98.4 F (36.9 C) (Oral)   Ht 5\' 5"  (1.651 m)   Wt 171 lb (77.6 kg)   SpO2 98%   BMI 28.46 kg/m  VS noted,  Constitutional: Pt appears in NAD HENT: Head: NCAT.  Right Ear: External  ear normal.  Left Ear: External ear normal.  Eyes: . Pupils are equal, round, and reactive to light. Conjunctivae and EOM are normal Lips without swelling Nose: without d/c or deformity Neck: Neck supple. Gross normal ROM Cardiovascular: Normal rate and regular rhythm.   Pulmonary/Chest: Effort normal and breath sounds without rales or wheezing.  Neurological: Pt is alert. At baseline orientation, motor grossly intact Skin: Skin is warm. No rashes, other new lesions, no LE  edema Psychiatric: Pt behavior is normal without agitation  No other exam findings    Assessment & Plan:

## 2018-01-11 DIAGNOSIS — T783XXA Angioneurotic edema, initial encounter: Secondary | ICD-10-CM | POA: Insufficient documentation

## 2018-01-11 DIAGNOSIS — E559 Vitamin D deficiency, unspecified: Secondary | ICD-10-CM | POA: Insufficient documentation

## 2018-01-11 NOTE — Assessment & Plan Note (Signed)
Resolved, to finish prednisone, cont benadryl prn if recurs

## 2018-01-11 NOTE — Assessment & Plan Note (Signed)
For f/u with next labs

## 2018-01-11 NOTE — Assessment & Plan Note (Signed)
Resolved, has f/u podiatry soon

## 2018-01-11 NOTE — Assessment & Plan Note (Signed)
ACE discontinued, to start amlodipine 5 qd, f/u BP at home and next visit

## 2018-01-11 NOTE — Assessment & Plan Note (Signed)
Lab Results  Component Value Date   HGBA1C 5.9 12/30/2017  stable overall by history and exam, recent data reviewed with pt, and pt to continue medical treatment as before,  to f/u any worsening symptoms or concerns

## 2018-01-20 ENCOUNTER — Ambulatory Visit: Payer: 59 | Admitting: Podiatry

## 2018-01-21 DIAGNOSIS — H401131 Primary open-angle glaucoma, bilateral, mild stage: Secondary | ICD-10-CM | POA: Diagnosis not present

## 2018-02-27 ENCOUNTER — Encounter: Payer: Self-pay | Admitting: Internal Medicine

## 2018-04-29 ENCOUNTER — Encounter (HOSPITAL_COMMUNITY): Payer: Self-pay

## 2018-04-29 ENCOUNTER — Emergency Department (HOSPITAL_COMMUNITY)
Admission: EM | Admit: 2018-04-29 | Discharge: 2018-04-29 | Disposition: A | Discharge status: Home / Self Care | Payer: Self-pay | Attending: Emergency Medicine | Admitting: Emergency Medicine

## 2018-04-29 ENCOUNTER — Emergency Department (HOSPITAL_COMMUNITY): Payer: Self-pay

## 2018-04-29 ENCOUNTER — Other Ambulatory Visit: Payer: Self-pay

## 2018-04-29 DIAGNOSIS — I509 Heart failure, unspecified: Secondary | ICD-10-CM | POA: Insufficient documentation

## 2018-04-29 DIAGNOSIS — F17210 Nicotine dependence, cigarettes, uncomplicated: Secondary | ICD-10-CM | POA: Insufficient documentation

## 2018-04-29 DIAGNOSIS — I110 Hypertensive heart disease with heart failure: Secondary | ICD-10-CM | POA: Insufficient documentation

## 2018-04-29 DIAGNOSIS — Z79899 Other long term (current) drug therapy: Secondary | ICD-10-CM | POA: Insufficient documentation

## 2018-04-29 DIAGNOSIS — E039 Hypothyroidism, unspecified: Secondary | ICD-10-CM | POA: Insufficient documentation

## 2018-04-29 DIAGNOSIS — R109 Unspecified abdominal pain: Secondary | ICD-10-CM | POA: Insufficient documentation

## 2018-04-29 LAB — LIPASE, BLOOD: LIPASE: 29 U/L (ref 11–51)

## 2018-04-29 LAB — URINALYSIS, ROUTINE W REFLEX MICROSCOPIC
BILIRUBIN URINE: NEGATIVE
GLUCOSE, UA: NEGATIVE mg/dL
HGB URINE DIPSTICK: NEGATIVE
KETONES UR: NEGATIVE mg/dL
Leukocytes, UA: NEGATIVE
Nitrite: NEGATIVE
PROTEIN: NEGATIVE mg/dL
Specific Gravity, Urine: 1.023 (ref 1.005–1.030)
pH: 5 (ref 5.0–8.0)

## 2018-04-29 LAB — CBC
HCT: 48.3 % (ref 39.0–52.0)
Hemoglobin: 15.7 g/dL (ref 13.0–17.0)
MCH: 28.6 pg (ref 26.0–34.0)
MCHC: 32.5 g/dL (ref 30.0–36.0)
MCV: 88.1 fL (ref 78.0–100.0)
PLATELETS: 209 10*3/uL (ref 150–400)
RBC: 5.48 MIL/uL (ref 4.22–5.81)
RDW: 13.4 % (ref 11.5–15.5)
WBC: 5.8 10*3/uL (ref 4.0–10.5)

## 2018-04-29 LAB — COMPREHENSIVE METABOLIC PANEL
ALT: 26 U/L (ref 0–44)
AST: 26 U/L (ref 15–41)
Albumin: 3.6 g/dL (ref 3.5–5.0)
Alkaline Phosphatase: 44 U/L (ref 38–126)
Anion gap: 4 — ABNORMAL LOW (ref 5–15)
BUN: 9 mg/dL (ref 6–20)
CHLORIDE: 105 mmol/L (ref 98–111)
CO2: 27 mmol/L (ref 22–32)
Calcium: 8.7 mg/dL — ABNORMAL LOW (ref 8.9–10.3)
Creatinine, Ser: 1.23 mg/dL (ref 0.61–1.24)
GFR calc Af Amer: >60 mL/min (ref >60)
GFR calc non Af Amer: >60 mL/min (ref >60)
Glucose, Bld: 87 mg/dL (ref 70–99)
Potassium: 3.9 mmol/L (ref 3.5–5.1)
Sodium: 136 mmol/L (ref 135–145)
Total Bilirubin: 0.6 mg/dL (ref 0.3–1.2)
Total Protein: 6.9 g/dL (ref 6.5–8.1)

## 2018-04-29 LAB — POC OCCULT BLOOD, ED: Fecal Occult Bld: POSITIVE — AB

## 2018-04-29 MED ORDER — SUCRALFATE 1 GM/10ML PO SUSP: 1.0000 g | Freq: Three times a day (TID) | ORAL | 0 refills | Status: DC

## 2018-04-29 MED ORDER — OMEPRAZOLE 40 MG PO CPDR
40.0000 mg | DELAYED_RELEASE_CAPSULE | Freq: Every day | ORAL | 0 refills | Status: DC
Start: 2018-04-29 — End: 2019-06-10

## 2018-04-29 MED ORDER — ONDANSETRON HCL 4 MG/2ML IJ SOLN
4.0000 mg | Freq: Once | INTRAMUSCULAR | Status: AC
  Administered 2018-04-29: 4 mg via INTRAVENOUS
  Filled 2018-04-29: qty 2

## 2018-04-29 MED ORDER — SODIUM CHLORIDE 0.9 % IV BOLUS
500.0000 mL | Freq: Once | INTRAVENOUS | Status: AC
  Administered 2018-04-29: 500 mL via INTRAVENOUS

## 2018-04-29 MED ORDER — FAMOTIDINE IN NACL 20-0.9 MG/50ML-% IV SOLN
20.0000 mg | Freq: Once | INTRAVENOUS | Status: AC
  Administered 2018-04-29: 20 mg via INTRAVENOUS
  Filled 2018-04-29: qty 50

## 2018-04-29 MED ORDER — DICYCLOMINE HCL 10 MG PO CAPS
20.0000 mg | ORAL_CAPSULE | Freq: Once | ORAL | Status: AC
  Administered 2018-04-29: 20 mg via ORAL
  Filled 2018-04-29: qty 2

## 2018-04-29 MED ORDER — GI COCKTAIL ~~LOC~~
30.0000 mL | Freq: Once | ORAL | Status: AC
  Administered 2018-04-29: 30 mL via ORAL
  Filled 2018-04-29: qty 30

## 2018-04-29 NOTE — ED Notes (Signed)
Patient transported to Ultrasound 

## 2018-04-29 NOTE — Discharge Instructions (Addendum)
You were seen in the emergency department today for abdominal pain.  Your lab work was reassuring.  Your ultrasound was normal.  There was blood in your stool which she reported has been present on and off for a while.  There is discomfort may be related to ulcers as you suspected.  For this reason we are starting you on omeprazole, a proton pump inhibitor medication that helps with acid.  Please take this daily each morning.  We are also sending you home with Carafate, this is a medicine to help with this as well, take this 4 times daily with meals and at bedtime.  We have prescribed you new medication(s) today. Discuss the medications prescribed today with your pharmacist as they can have adverse effects and interactions with your other medicines including over the counter and prescribed medications. Seek medical evaluation if you start to experience new or abnormal symptoms after taking one of these medicines, seek care immediately if you start to experience difficulty breathing, feeling of your throat closing, facial swelling, or rash as these could be indications of a more serious allergic reaction   Please follow the diet guidelines in your discharge instructions.  We would like you to follow-up with a GI doctor within 3 days, you may call the office number provided in your discharge instructions.  Should your pain worsen, you have worsening bleeding, he started to feel lightheaded/dizzy/have chest pain/shortness of breath/failure going to pass out, or, or any other concerns please return to the emergency department.

## 2018-04-29 NOTE — ED Provider Notes (Signed)
[facility] EMERGENCY DEPARTMENT Provider Note   CSN: [id number] Arrival date & time: 04/29/18  1123     History   Chief Complaint Chief Complaint  Patient presents with  . Abdominal Pain    HPI Cory [name] is a 52 y.o. male with a hx of tobacco abuse, GERD, CHF, cholelithiasis, HTN, hypothyroidism, and hyperlipidemia who presents to the emergency department with complaints of abdominal discomfort which started approximately 1 hour prior to arrival. Patient states that he was driving to work and had [place] for breakfast. Shortly after eating he developed periumbilical/epigastric pain. Describes pain as cramping. Rates pain a 5/10 in severity at present. No specific alleviating/aggravating factors. He states that he has had associated nausea with 3 episodes of emesis, non bloody, non coffee ground appearing. Patient's pain feels similar to previous stomach ulcer problems which he has not followed up for or had issues with recently. He states with his ulcers he intermittently has blood in his stool- this has been occurring on and off for several months, no change. He also feels that he is having loose stools. Denies fever, constipation, chest pain, dyspnea, dysuria, or testicular pain/swelling. Denies recent increase in NSAIDs or ETOH- he reports he drinks 1 beer per day.   HPI  Past Medical History:  Diagnosis Date  . Carpal tunnel syndrome of right wrist   . CHF (congestive heart failure) ([place])   . Cholelithiasis   . Colon polyps 09/09/2013   hyperplastic  . Condyloma   . Diverticulosis 09/09/2013  . ED (erectile dysfunction)   . GERD (gastroesophageal reflux disease)   . Hyperglycemia 04/14/2011  . Hyperlipidemia   . Hypertension   . Hypothyroidism   . Mild stage glaucoma(365.71)    Dr Ricki Miller  . Tobacco abuse     Patient Active Problem List   Diagnosis Date Noted  . Vitamin D deficiency 01/11/2018  . Angioedema 01/11/2018  . Right ear  impacted cerumen 12/30/2017  . Toe pain, right 12/30/2017  . Tinea cruris 04/08/2017  . Numbness of fingers 04/08/2017  . Nausea with vomiting 02/14/2016  . Diarrhea 02/14/2016  . Elbow strain 12/29/2015  . Abdominal tenderness, unspecified site 12/12/2015  . Gastroesophageal reflux disease with esophagitis 12/12/2015  . Dysphagia 12/12/2015  . Numbness and tingling in right hand 10/24/2015  . Muscle cramps 10/24/2015  . Rectal bleeding 07/09/2013  . Preventative health care 03/19/2013  . Hemorrhoids, internal, with bleeding, and prolapse 08/26/2011  . Hyperglycemia 04/14/2011  . CARPAL TUNNEL SYNDROME, RIGHT 01/08/2011  . Essential hypertension 09/17/2010  . CONDYLOMA 07/30/2010  . BACK PAIN 07/23/2010  . HAND PAIN, RIGHT 07/23/2010  . ERECTILE DYSFUNCTION, ORGANIC 03/12/2010  . HOARSENESS 05/12/2009  . Hyperlipidemia 01/07/2008  . TOBACCO ABUSE 01/07/2008  . Hypothyroidism 07/04/2007  . CHOLELITHIASIS 07/04/2007    Past Surgical History:  Procedure Laterality Date  . COLONOSCOPY W/ BIOPSIES AND POLYPECTOMY  09/09/2013   hyperplastic polyps        Home Medications    Prior to Admission medications   Medication Sig Start Date End Date Taking? Authorizing Provider  amLODipine (NORVASC) 5 MG tablet Take 1 tablet (5 mg total) by mouth daily. 01/09/18 01/09/19  Biagio Borg, MD  diphenhydrAMINE (BENADRYL) 50 MG tablet Take 1 tablet (50 mg total) by mouth every 6 (six) hours as needed for up to 1 day for itching (for the next day). 01/08/18 01/09/18  Mackuen, Courteney Lyn, MD  dorzolamide (TRUSOPT) 2 % ophthalmic solution  12/11/15  [provider]  dorzolamide-timolol (COSOPT) 22.3-6.8 MG/ML ophthalmic solution Place 1 drop into both eyes daily. 10/11/14   Harden Mo, MD  latanoprost (XALATAN) 0.005 % ophthalmic solution Place 1 drop into both eyes 2 (two) times daily. 10/11/14   Harden Mo, MD  levothyroxine (SYNTHROID, LEVOTHROID) 125 MCG tablet Take 1 tablet  (125 mcg total) by mouth daily. Must keep schedule appt w/new provider for future refills 01/09/18   Biagio Borg, MD  pravastatin (PRAVACHOL) 40 MG tablet Take 1 tablet (40 mg total) by mouth daily. Must keep schedule appt w/new provider for future refills 01/09/18   Biagio Borg, MD  Vitamin D, Ergocalciferol, (DRISDOL) 50000 units CAPS capsule Take 1 capsule (50,000 Units total) by mouth every 7 (seven) days. 12/30/17   Biagio Borg, MD    Family History Family History  Problem Relation Age of Onset  . Breast cancer Mother        deceased at age 79 secondary to complications of breast cancer  . Diabetes Father   . Stroke Father   . Glaucoma Father   . Hypertension Father   . Blindness Father        leagally blind  . Heart disease Father   . Diabetes Brother   . Alcohol abuse Brother   . Seizures Brother   . Other Neg Hx        prostate cancer, colon cancer    Social History Social History   Tobacco Use  . Smoking status: Current Every Day Smoker    Packs/day: 0.75    Years: 38.00    Pack years: 28.50    Types: Cigarettes  . Smokeless tobacco: Never Used  . Tobacco comment: "light" smoker since age 73  Substance Use Topics  . Alcohol use: Yes    Alcohol/week: 3.6 oz    Types: 6 Cans of beer per week    Comment: 22 ounces daily  . Drug use: Yes    Types: Marijuana    Comment: OCCASIONAL none since 08/11/14     Allergies   Ace inhibitors   Review of Systems Review of Systems  Constitutional: Negative for fever.  Respiratory: Negative for shortness of breath.   Cardiovascular: Negative for chest pain and leg swelling.  Gastrointestinal: Positive for abdominal pain, blood in stool, diarrhea, nausea and vomiting. Negative for constipation.  Genitourinary: Negative for dysuria, scrotal swelling and testicular pain.  Neurological: Negative for dizziness, weakness, light-headedness and numbness.  All other systems reviewed and are negative.    Physical  Exam Updated Vital Signs BP (!) 144/115 (BP Location: Right Arm)   Pulse 73   Temp 98.7 F (37.1 C) (Oral)   Resp 18   Ht 5\' 5"  (1.651 m)   Wt 79.4 kg (175 lb)   SpO2 100%   BMI 29.12 kg/m   Physical Exam  Constitutional: He appears well-developed and well-nourished. No distress.  HENT:  Head: Normocephalic and atraumatic.  Eyes: Conjunctivae are normal. Right eye exhibits no discharge. Left eye exhibits no discharge.  Cardiovascular: Normal rate and regular rhythm.  No murmur heard. Pulmonary/Chest: Breath sounds normal. No respiratory distress. He has no wheezes. He has no rales.  Abdominal: Soft. He exhibits no distension. There is tenderness (mild) in the epigastric area and periumbilical area. There is no rigidity, no rebound, no guarding, no CVA tenderness, no tenderness at McBurney's point and negative Murphy's sign.  Genitourinary: Rectal exam shows external hemorrhoid (6:00 position, 0.5cm,  no evidence  of thrombosis).  Genitourinary Comments: Soft brown stool in rectal vault. No gross melena or bright red blood. EDT present as chaperone.   Neurological: He is alert.  Clear speech.   Skin: Skin is warm and dry. No rash noted.  Psychiatric: He has a normal mood and affect. His behavior is normal.  Nursing note and vitals reviewed.   ED Treatments / Results  Labs Results for orders placed or performed during the hospital encounter of 04/29/18  Lipase, blood  Result Value Ref Range   Lipase 29 11 - 51 U/L  Comprehensive metabolic panel  Result Value Ref Range   Sodium 136 135 - 145 mmol/L   Potassium 3.9 3.5 - 5.1 mmol/L   Chloride 105 98 - 111 mmol/L   CO2 27 22 - 32 mmol/L   Glucose, Bld 87 70 - 99 mg/dL   BUN 9 6 - 20 mg/dL   Creatinine, Ser 1.23 0.61 - 1.24 mg/dL   Calcium 8.7 (L) 8.9 - 10.3 mg/dL   Total Protein 6.9 6.5 - 8.1 g/dL   Albumin 3.6 3.5 - 5.0 g/dL   AST 26 15 - 41 U/L   ALT 26 0 - 44 U/L   Alkaline Phosphatase 44 38 - 126 U/L   Total  Bilirubin 0.6 0.3 - 1.2 mg/dL   GFR calc non Af Amer >60 >60 mL/min   GFR calc Af Amer >60 >60 mL/min   Anion gap 4 (L) 5 - 15  CBC  Result Value Ref Range   WBC 5.8 4.0 - 10.5 K/uL   RBC 5.48 4.22 - 5.81 MIL/uL   Hemoglobin 15.7 13.0 - 17.0 g/dL   HCT 48.3 39.0 - 52.0 %   MCV 88.1 78.0 - 100.0 fL   MCH 28.6 26.0 - 34.0 pg   MCHC 32.5 30.0 - 36.0 g/dL   RDW 13.4 11.5 - 15.5 %   Platelets 209 150 - 400 K/uL  Urinalysis, Routine w reflex microscopic  Result Value Ref Range   Color, Urine YELLOW YELLOW   APPearance CLEAR CLEAR   Specific Gravity, Urine 1.023 1.005 - 1.030   pH 5.0 5.0 - 8.0   Glucose, UA NEGATIVE NEGATIVE mg/dL   Hgb urine dipstick NEGATIVE NEGATIVE   Bilirubin Urine NEGATIVE NEGATIVE   Ketones, ur NEGATIVE NEGATIVE mg/dL   Protein, ur NEGATIVE NEGATIVE mg/dL   Nitrite NEGATIVE NEGATIVE   Leukocytes, UA NEGATIVE NEGATIVE  POC occult blood, ED Provider will collect  Result Value Ref Range   Fecal Occult Bld POSITIVE (A) NEGATIVE   No results found. EKG None  Radiology US Abdomen Limited Ruq  Result Date: 04/29/2018 CLINICAL DATA:  Right upper quadrant pain. EXAM: ULTRASOUND ABDOMEN LIMITED RIGHT UPPER QUADRANT COMPARISON:  None. FINDINGS: Gallbladder: No gallstones or wall thickening visualized. No sonographic Murphy sign noted by sonographer. Common bile duct: Diameter: 3.7 mm Liver: No focal lesion identified. Within normal limits in parenchymal echogenicity. Portal vein is patent on color Doppler imaging with normal direction of blood flow towards the liver. IMPRESSION: 1. Normal right upper quadrant ultrasound. Electronically Signed   By: Kathreen Devoid   On: 04/29/2018 15:39    Procedures Procedures (including critical care time)  Medications Ordered in ED Medications  famotidine (PEPCID) IVPB 20 mg premix (0 mg Intravenous Stopped 04/29/18 1313)  gi cocktail (Maalox,Lidocaine,Donnatal) (30 mLs Oral Given 04/29/18 1243)  ondansetron (ZOFRAN) injection 4  mg (4 mg Intravenous Given 04/29/18 1242)  sodium chloride 0.9 % bolus 500 mL ( Intravenous  Stopped 04/29/18 1418)  dicyclomine (BENTYL) capsule 20 mg (20 mg Oral Given 04/29/18 1451)     Initial Impression / Assessment and Plan / ED Course  I have reviewed the triage vital signs and the nursing notes.  Pertinent labs & imaging results that were available during my care of the patient were reviewed by me and considered in my medical decision making (see chart for details).  Patient presents with epigastric/periumbilical pain with associated N/V/D after eating chick-fil-a. Patient nontoxic appearing, in no apparent distress, vitals WNL other than elevated BP doubt HTN emergency, patient aware of need for recheck. Mild epigastric/periumbilical tenderness. DDX: GERD, peptic ulcer disease, cholecystitis, cholelithiasis, pancreatitis, bowel obstruction/perforation, gastroenteritis. Will obtain labs, RUQ [place], and initiate care with GI cocktail, pepcid, and fluids.   RUQ  Ultrasound negative, no evidence of cholecystitis or cholelithiasis. Labs reviewed and grossly unremarkable. Patient without leukocytosis. No significant electrolyte abnormalities, mild hypocalcemia at 8.7. UA without infection. LFTs, renal fxn, and lipase WNL, no indication of pancreatitis. Fecal occult blood positive- query ulcer vs. Hemorrhoid as etiology of this, no bright red blood or melena on exam, patient's hgb is 15.7, vitals signs reassuring- no hypotension or tachycardia, appears hemodynamically stable. Patient tolerating PO in the ER. Will start patient on PPI, provide prescription for carafate, and have him follow up closely with GI with return precautions. Diet counseling performed.  I discussed results, treatment plan, need for follow-up, and return precautions with the patient. Patient requesting work note on multiple occassion- this was provided. Provided opportunity for questions, patient confirmed understanding and is in  agreement with plan.   Findings and plan of care discussed with supervising physician Dr. Regenia Skeeter who is in agreement.    Final Clinical Impressions(s) / ED Diagnoses   Final diagnoses:  Abdominal pain    ED Discharge Orders        Ordered    omeprazole (PRILOSEC) 40 MG capsule  Daily     04/29/18 1614    sucralfate (CARAFATE) 1 GM/10ML suspension  3 times daily with meals & bedtime     04/29/18 1614       Petrucelli, [place] R, PA-C 04/29/18 1707    Sherwood Gambler, MD 04/30/18 1705

## 2018-04-29 NOTE — ED Triage Notes (Signed)
Pt reports that he started having stomach pain this morning after eating breakfast. Pt reports he did take a zantac.

## 2018-04-29 NOTE — ED Notes (Signed)
Pt back from US

## 2018-11-06 ENCOUNTER — Encounter (HOSPITAL_COMMUNITY): Payer: Self-pay | Admitting: Emergency Medicine

## 2018-11-06 ENCOUNTER — Emergency Department (HOSPITAL_COMMUNITY)
Admission: EM | Admit: 2018-11-06 | Discharge: 2018-11-06 | Disposition: A | Discharge status: Home / Self Care | Payer: Managed Care, Other (non HMO) | Attending: Emergency Medicine | Admitting: Emergency Medicine

## 2018-11-06 ENCOUNTER — Emergency Department (HOSPITAL_COMMUNITY): Payer: Managed Care, Other (non HMO)

## 2018-11-06 ENCOUNTER — Other Ambulatory Visit: Payer: Self-pay

## 2018-11-06 DIAGNOSIS — I110 Hypertensive heart disease with heart failure: Secondary | ICD-10-CM | POA: Insufficient documentation

## 2018-11-06 DIAGNOSIS — R519 Headache, unspecified: Secondary | ICD-10-CM

## 2018-11-06 DIAGNOSIS — E785 Hyperlipidemia, unspecified: Secondary | ICD-10-CM | POA: Insufficient documentation

## 2018-11-06 DIAGNOSIS — F17210 Nicotine dependence, cigarettes, uncomplicated: Secondary | ICD-10-CM | POA: Insufficient documentation

## 2018-11-06 DIAGNOSIS — R51 Headache: Secondary | ICD-10-CM | POA: Insufficient documentation

## 2018-11-06 DIAGNOSIS — I509 Heart failure, unspecified: Secondary | ICD-10-CM | POA: Insufficient documentation

## 2018-11-06 DIAGNOSIS — Z79899 Other long term (current) drug therapy: Secondary | ICD-10-CM | POA: Insufficient documentation

## 2018-11-06 DIAGNOSIS — F430 Acute stress reaction: Secondary | ICD-10-CM | POA: Diagnosis not present

## 2018-11-06 DIAGNOSIS — F439 Reaction to severe stress, unspecified: Secondary | ICD-10-CM

## 2018-11-06 DIAGNOSIS — E039 Hypothyroidism, unspecified: Secondary | ICD-10-CM | POA: Diagnosis not present

## 2018-11-06 LAB — I-STAT CHEM 8, ED
BUN: 13 mg/dL (ref 6–20)
Calcium, Ion: 1.16 mmol/L (ref 1.15–1.40)
Chloride: 105 mmol/L (ref 98–111)
Creatinine, Ser: 1.1 mg/dL (ref 0.61–1.24)
Glucose, Bld: 98 mg/dL (ref 70–99)
HEMATOCRIT: 47 % (ref 39.0–52.0)
Hemoglobin: 16 g/dL (ref 13.0–17.0)
POTASSIUM: 4.2 mmol/L (ref 3.5–5.1)
SODIUM: 139 mmol/L (ref 135–145)
TCO2: 27 mmol/L (ref 22–32)

## 2018-11-06 MED ORDER — DIPHENHYDRAMINE HCL 50 MG/ML IJ SOLN
12.5000 mg | Freq: Once | INTRAMUSCULAR | Status: AC
  Administered 2018-11-06: 12.5 mg via INTRAVENOUS
  Filled 2018-11-06: qty 1

## 2018-11-06 MED ORDER — PROCHLORPERAZINE EDISYLATE 10 MG/2ML IJ SOLN
10.0000 mg | Freq: Once | INTRAMUSCULAR | Status: AC
  Administered 2018-11-06: 10 mg via INTRAVENOUS
  Filled 2018-11-06: qty 2

## 2018-11-06 NOTE — ED Triage Notes (Signed)
Pt presents with a headache that he has had for 1 day. Pt reports he is stress and has not been able to sleep either. Pt denies contacting his PCP.

## 2018-11-06 NOTE — Discharge Instructions (Signed)
You were seen in the ER today for a headache. Your labs were norma.. Your CT of your head showed some congestion in your sinuses, no brain abnormalities.   Please see the attached handout regarding increased stress and mindfulness.  Please follow-up with your primary care provider in the next 3 to 5 days for reevaluation.  Return to the ER for new or worsening symptoms or any other concerns.  Please have your blood pressure rechecked at your follow-up appointment as it was elevated in the emergency department today.  Please take your blood pressure medications as prescribed.

## 2018-11-06 NOTE — ED Provider Notes (Signed)
[facility] EMERGENCY DEPARTMENT Provider Note   CSN: [id number] Arrival date & time: 11/06/18  0516     History   Chief Complaint Chief Complaint  Patient presents with  . Headache    HPI Cory [name] is a 53 y.o. male with history of tobacco abuse, CHF, hypertension, hyperlipidemia, and carpal tunnel syndrome who presents to the emergency department with complaint of headache which developed over the past 48 hours.  Pain headache is located to bilateral temporal regions, gradual onset, steady progression.  Headache is throbbing in nature, currently a 10 out of 10 in severity, no specific alleviating or aggravating factors.  No intervention trial prior to arrival.  Patient states that he believes the cause of his headache is due to increased stress at his job.  He states he had issues for the past 1 month but this is seem to worsen over the past 2 to 3 days.  He states that he is also had some paresthesias to the fingertips bilaterally for the past 1 month that come and go-he believes this is also secondary to increased stress.  States he has also been getting somewhat less sleep secondary to stress. He has not had a headache like this before.  He denies fever, chills, neck stiffness, change in vision, weakness, dizziness, or syncope.  Denies SI/HI or hallucinations.  He is also asking me about legal advice regarding the increased rest he had his job.  HPI  Past Medical History:  Diagnosis Date  . Carpal tunnel syndrome of right wrist   . CHF (congestive heart failure) ([place])   . Cholelithiasis   . Colon polyps 09/09/2013   hyperplastic  . Condyloma   . Diverticulosis 09/09/2013  . ED (erectile dysfunction)   . GERD (gastroesophageal reflux disease)   . Hyperglycemia 04/14/2011  . Hyperlipidemia   . Hypertension   . Hypothyroidism   . Mild stage glaucoma(365.71)    Dr Ricki Miller  . Tobacco abuse     Patient Active Problem List   Diagnosis Date Noted  .  Vitamin D deficiency 01/11/2018  . Angioedema 01/11/2018  . Right ear impacted cerumen 12/30/2017  . Toe pain, right 12/30/2017  . Tinea cruris 04/08/2017  . Numbness of fingers 04/08/2017  . Nausea with vomiting 02/14/2016  . Diarrhea 02/14/2016  . Elbow strain 12/29/2015  . Abdominal tenderness, unspecified site 12/12/2015  . Gastroesophageal reflux disease with esophagitis 12/12/2015  . Dysphagia 12/12/2015  . Numbness and tingling in right hand 10/24/2015  . Muscle cramps 10/24/2015  . Rectal bleeding 07/09/2013  . Preventative health care 03/19/2013  . Hemorrhoids, internal, with bleeding, and prolapse 08/26/2011  . Hyperglycemia 04/14/2011  . CARPAL TUNNEL SYNDROME, RIGHT 01/08/2011  . Essential hypertension 09/17/2010  . CONDYLOMA 07/30/2010  . BACK PAIN 07/23/2010  . HAND PAIN, RIGHT 07/23/2010  . ERECTILE DYSFUNCTION, ORGANIC 03/12/2010  . HOARSENESS 05/12/2009  . Hyperlipidemia 01/07/2008  . TOBACCO ABUSE 01/07/2008  . Hypothyroidism 07/04/2007  . CHOLELITHIASIS 07/04/2007    Past Surgical History:  Procedure Laterality Date  . COLONOSCOPY W/ BIOPSIES AND POLYPECTOMY  09/09/2013   hyperplastic polyps        Home Medications    Prior to Admission medications   Medication Sig Start Date End Date Taking? Authorizing Provider  amLODipine (NORVASC) 5 MG tablet Take 1 tablet (5 mg total) by mouth daily. 01/09/18 01/09/19  Biagio Borg, MD  diphenhydrAMINE (BENADRYL) 50 MG tablet Take 1 tablet (50 mg total) by mouth every  6 (six) hours as needed for up to 1 day for itching (for the next day). 01/08/18 01/09/18  Mackuen, Courteney Lyn, MD  dorzolamide (TRUSOPT) 2 % ophthalmic solution  12/11/15   [provider]  dorzolamide-timolol (COSOPT) 22.3-6.8 MG/ML ophthalmic solution Place 1 drop into both eyes daily. 10/11/14   Harden Mo, MD  latanoprost (XALATAN) 0.005 % ophthalmic solution Place 1 drop into both eyes 2 (two) times daily. 10/11/14   Harden Mo, MD   levothyroxine (SYNTHROID, LEVOTHROID) 125 MCG tablet Take 1 tablet (125 mcg total) by mouth daily. Must keep schedule appt w/new provider for future refills 01/09/18   Biagio Borg, MD  omeprazole (PRILOSEC) 40 MG capsule Take 1 capsule (40 mg total) by mouth daily. 04/29/18   Kambrie Eddleman R, PA-C  pravastatin (PRAVACHOL) 40 MG tablet Take 1 tablet (40 mg total) by mouth daily. Must keep schedule appt w/new provider for future refills 01/09/18   Biagio Borg, MD  sucralfate (CARAFATE) 1 GM/10ML suspension Take 10 mLs (1 g total) by mouth 4 (four) times daily -  with meals and at bedtime. 04/29/18   Timmie Calix R, PA-C  Vitamin D, Ergocalciferol, (DRISDOL) 50000 units CAPS capsule Take 1 capsule (50,000 Units total) by mouth every 7 (seven) days. 12/30/17   Biagio Borg, MD    Family History Family History  Problem Relation Age of Onset  . Breast cancer Mother        deceased at age 60 secondary to complications of breast cancer  . Diabetes Father   . Stroke Father   . Glaucoma Father   . Hypertension Father   . Blindness Father        leagally blind  . Heart disease Father   . Diabetes Brother   . Alcohol abuse Brother   . Seizures Brother   . Other Neg Hx        prostate cancer, colon cancer    Social History Social History   Tobacco Use  . Smoking status: Current Every Day Smoker    Packs/day: 0.75    Years: 38.00    Pack years: 28.50    Types: Cigarettes  . Smokeless tobacco: Never Used  . Tobacco comment: "light" smoker since age 14  Substance Use Topics  . Alcohol use: Yes    Alcohol/week: 6.0 standard drinks    Types: 6 Cans of beer per week    Comment: 22 ounces daily  . Drug use: Yes    Types: Marijuana    Comment: OCCASIONAL none since 08/11/14     Allergies   Ace inhibitors   Review of Systems Review of Systems  Constitutional: Negative for chills and fever.  Eyes: Negative for visual disturbance.  Respiratory: Negative for shortness of  breath.   Cardiovascular: Negative for chest pain.  Gastrointestinal: Negative for abdominal pain, nausea and vomiting.  Musculoskeletal: Negative for neck stiffness.  Neurological: Positive for headaches. Negative for dizziness, syncope, weakness and numbness.       Positive for intermittent paresthesias to bilateral finger tips.   Psychiatric/Behavioral: Positive for sleep disturbance. Negative for hallucinations and suicidal ideas.  All other systems reviewed and are negative.    Physical Exam Updated Vital Signs BP (!) 170/94 (BP Location: Right Arm)   Pulse 80   Temp 98.1 F (36.7 C) (Oral)   Resp 16   Ht 5\' 5"  (1.651 m)   Wt 74.8 kg   SpO2 100%   BMI 27.46 kg/m  Physical Exam Vitals signs and nursing note reviewed.  Constitutional:      General: He is not in acute distress.    Appearance: He is well-developed. He is not toxic-appearing.  HENT:     Head: Normocephalic and atraumatic.     Comments: No temporal artery abnormalities noted.  Eyes:     General:        Right eye: No discharge.        Left eye: No discharge.     Conjunctiva/sclera: Conjunctivae normal.  Neck:     Musculoskeletal: Neck supple. No neck rigidity.  Cardiovascular:     Rate and Rhythm: Normal rate and regular rhythm.  Pulmonary:     Effort: Pulmonary effort is normal. No respiratory distress.     Breath sounds: Normal breath sounds. No wheezing, rhonchi or rales.  Abdominal:     General: There is no distension.     Palpations: Abdomen is soft.     Tenderness: There is no abdominal tenderness.  Skin:    General: Skin is warm and dry.     Findings: No rash.  Neurological:     Mental Status: He is alert.     Comments: Clear speech.  CN III through XII grossly intact.  Sensation grossly intact bilateral upper and lower extremities.  5 out of 5 symmetric grip strength.  5 out of 5 strength with plantar dorsiflexion bilaterally.  Normal finger-to-nose.  Negative pronator drift.  Negative  Romberg.  Gait steady and intact.  Psychiatric:        Attention and Perception: He is attentive.        Behavior: Behavior normal.        Thought Content: Thought content does not include homicidal or suicidal ideation.      ED Treatments / Results  Labs (all labs ordered are listed, but only abnormal results are displayed) Labs Reviewed  I-STAT CHEM 8, ED    EKG None  Radiology Ct Head Wo Contrast  Result Date: 11/06/2018 CLINICAL DATA:  Severe headache EXAM: CT HEAD WITHOUT CONTRAST TECHNIQUE: Contiguous axial images were obtained from the base of the skull through the vertex without intravenous contrast. COMPARISON:  None. FINDINGS: Brain: The ventricles are normal in size and configuration. There is no intracranial mass, hemorrhage, extra-axial fluid collection, or midline shift. The brain parenchyma appears unremarkable. No acute infarct evident. Vascular: There is no hyperdense vessel. There is no appreciable vascular calcification. Skull: The bony calvarium appears intact. Sinuses/Orbits: There is slight mucosal thickening in the periphery of each ethmoid air cell complex. Other paranasal sinuses are clear. Orbits appear symmetric bilaterally. Other: Mastoid air cells are somewhat hypoplastic but clear. There is debris in the right external auditory canal. IMPRESSION: Brain parenchyma appears unremarkable.  No mass or hemorrhage. There is slight mucosal thickening in the ethmoid regions. There is probable cerumen in the right external auditory canal. Electronically Signed   By: Lowella Grip III M.D.   On: 11/06/2018 08:07    Procedures Procedures (including critical care time)  Medications Ordered in ED Medications  diphenhydrAMINE (BENADRYL) injection 12.5 mg (12.5 mg Intravenous Given 11/06/18 0818)  prochlorperazine (COMPAZINE) injection 10 mg (10 mg Intravenous Given 11/06/18 0818)     Initial Impression / Assessment and Plan / ED Course  I have reviewed the triage  vital signs and the nursing notes.  Pertinent labs & imaging results that were available during my care of the patient were reviewed by me and considered in my medical  decision making (see chart for details).    Patient presents with complaint of headache. Patient is nontoxic appearing, vitals WNL, subsequent blood pressure check with elevated BP then normalized again, doubt HTN emergency. Given patient has not a history of similar headaches a CT of the head was obtained and was negative for hemorrhage, mass, or other concerning findings.  His intermittent paresthesias do not occur in a specific pattern that is concerning for ischemic CVA, i-STAT Chem-8 obtained without significant electrolyte disturbance, sensation intact on exam. Headache w/ gradual onset with steady progression in severity,  pt is afebrile with no focal neuro deficits, dizziness, change in vision, proptosis, or nuchal rigidity- nnon concerning for [facility], ICH, ischemic CVA, dural venous sinus thrombosis, acute glaucoma, giant cell arteritis, mass, or meningitis.Patient treated for headache with migraine cocktail with improvement.  Feel he is safe for discharge home at this time.  Resources were provided regarding counseling, also discussed need for PCP follow-up to further discuss this increase stress.  Patient without SI/HI or need for acute psychiatric evaluation at this time.  I discussed treatment plan, need for PCP follow-up, and return precautions with the patient. Provided opportunity for questions, patient confirmed understanding and is in agreement with plan.    Final Clinical Impressions(s) / ED Diagnoses   Final diagnoses:  Acute nonintractable headache, unspecified headache type  Stress    ED Discharge Orders    None       Amaryllis Dyke, PA-C 11/06/18 0932    Gareth Morgan, MD 11/07/18 1039

## 2019-01-19 ENCOUNTER — Telehealth: Payer: Self-pay | Admitting: Internal Medicine

## 2019-01-20 MED ORDER — LEVOTHYROXINE SODIUM 125 MCG PO TABS: 125.0000 ug | ORAL_TABLET | Freq: Every day | ORAL | 0 refills | Status: DC

## 2019-01-20 NOTE — Addendum Note (Signed)
Addended by: Juliet Rude on: 01/20/2019 12:48 PM   Modules accepted: Orders

## 2019-01-20 NOTE — Telephone Encounter (Signed)
Pt has made an appt for 01-27-2019

## 2019-01-26 ENCOUNTER — Telehealth: Payer: Self-pay

## 2019-01-26 NOTE — Telephone Encounter (Signed)
Pt has been screened for covid-19 symptoms.  Pt denies fever, chills, SOB, cough, n/v/d  Pt has been advised no additional visitors at the [facility] at this time. Pt expressed understanding.

## 2019-01-27 ENCOUNTER — Other Ambulatory Visit: Payer: Self-pay

## 2019-01-27 ENCOUNTER — Other Ambulatory Visit (INDEPENDENT_AMBULATORY_CARE_PROVIDER_SITE_OTHER): Payer: Managed Care, Other (non HMO)

## 2019-01-27 ENCOUNTER — Ambulatory Visit (INDEPENDENT_AMBULATORY_CARE_PROVIDER_SITE_OTHER): Payer: Managed Care, Other (non HMO) | Admitting: Internal Medicine

## 2019-01-27 ENCOUNTER — Encounter: Payer: Self-pay | Admitting: Internal Medicine

## 2019-01-27 VITALS — BP 118/72 | HR 64 | Temp 98.1°F | Ht 65.0 in | Wt 165.0 lb

## 2019-01-27 DIAGNOSIS — K625 Hemorrhage of anus and rectum: Secondary | ICD-10-CM | POA: Diagnosis not present

## 2019-01-27 DIAGNOSIS — Z0000 Encounter for general adult medical examination without abnormal findings: Secondary | ICD-10-CM

## 2019-01-27 DIAGNOSIS — R519 Headache, unspecified: Secondary | ICD-10-CM | POA: Insufficient documentation

## 2019-01-27 DIAGNOSIS — R739 Hyperglycemia, unspecified: Secondary | ICD-10-CM

## 2019-01-27 DIAGNOSIS — R51 Headache: Secondary | ICD-10-CM

## 2019-01-27 LAB — URINALYSIS, ROUTINE W REFLEX MICROSCOPIC
Bilirubin Urine: NEGATIVE
Hgb urine dipstick: NEGATIVE
Nitrite: NEGATIVE
RBC / HPF: NONE SEEN (ref 0–?)
Specific Gravity, Urine: 1.025 (ref 1.000–1.030)
Total Protein, Urine: NEGATIVE
Urine Glucose: NEGATIVE
Urobilinogen, UA: 0.2 (ref 0.0–1.0)
pH: 6 (ref 5.0–8.0)

## 2019-01-27 LAB — PSA: PSA: 2.27 ng/mL (ref 0.10–4.00)

## 2019-01-27 LAB — BASIC METABOLIC PANEL
BUN: 14 mg/dL (ref 6–23)
CO2: 30 mEq/L (ref 19–32)
Calcium: 9.2 mg/dL (ref 8.4–10.5)
Chloride: 109 mEq/L (ref 96–112)
Creatinine, Ser: 1.32 mg/dL (ref 0.40–1.50)
GFR: 68.15 mL/min (ref >60.00)
Glucose, Bld: 103 mg/dL — ABNORMAL HIGH (ref 70–99)
Potassium: 4.1 mEq/L (ref 3.5–5.1)
SODIUM: 144 meq/L (ref 135–145)

## 2019-01-27 LAB — CBC WITH DIFFERENTIAL/PLATELET
Basophils Absolute: 0.1 10*3/uL (ref 0.0–0.1)
Basophils Relative: 1 % (ref 0.0–3.0)
Eosinophils Absolute: 0.2 10*3/uL (ref 0.0–0.7)
Eosinophils Relative: 2 % (ref 0.0–5.0)
HCT: 43.9 % (ref 39.0–52.0)
Hemoglobin: 14.9 g/dL (ref 13.0–17.0)
Lymphocytes Relative: 20.3 % (ref 12.0–46.0)
Lymphs Abs: 1.6 10*3/uL (ref 0.7–4.0)
MCHC: 33.9 g/dL (ref 30.0–36.0)
MCV: 87.6 fl (ref 78.0–100.0)
Monocytes Absolute: 0.6 10*3/uL (ref 0.1–1.0)
Monocytes Relative: 7.2 % (ref 3.0–12.0)
Neutro Abs: 5.4 10*3/uL (ref 1.4–7.7)
Neutrophils Relative %: 69.5 % (ref 43.0–77.0)
Platelets: 209 10*3/uL (ref 150.0–400.0)
RBC: 5.02 Mil/uL (ref 4.22–5.81)
RDW: 14 % (ref 11.5–15.5)
WBC: 7.7 10*3/uL (ref 4.0–10.5)

## 2019-01-27 LAB — LIPID PANEL
Cholesterol: 168 mg/dL (ref 0–200)
HDL: 49.4 mg/dL (ref >39.00)
LDL Cholesterol: 97 mg/dL (ref 0–99)
NonHDL: 118.91
Total CHOL/HDL Ratio: 3
Triglycerides: 109 mg/dL (ref 0.0–149.0)
VLDL: 21.8 mg/dL (ref 0.0–40.0)

## 2019-01-27 LAB — HEMOGLOBIN A1C: Hgb A1c MFr Bld: 5.9 % (ref 4.6–6.5)

## 2019-01-27 LAB — HEPATIC FUNCTION PANEL
ALT: 22 U/L (ref 0–53)
AST: 19 U/L (ref 0–37)
Albumin: 3.4 g/dL — ABNORMAL LOW (ref 3.5–5.2)
Alkaline Phosphatase: 42 U/L (ref 39–117)
Bilirubin, Direct: 0 mg/dL (ref 0.0–0.3)
Total Bilirubin: 0.2 mg/dL (ref 0.2–1.2)
Total Protein: 5.6 g/dL — ABNORMAL LOW (ref 6.0–8.3)

## 2019-01-27 LAB — TSH: TSH: 4.92 u[IU]/mL — ABNORMAL HIGH (ref 0.35–4.50)

## 2019-01-27 MED ORDER — LEVOTHYROXINE SODIUM 125 MCG PO TABS: 125.0000 ug | ORAL_TABLET | Freq: Every day | ORAL | 3 refills | Status: DC

## 2019-01-27 MED ORDER — AMLODIPINE BESYLATE 5 MG PO TABS: 5.0000 mg | ORAL_TABLET | Freq: Every day | ORAL | 3 refills | Status: DC

## 2019-01-27 MED ORDER — PRAVASTATIN SODIUM 40 MG PO TABS: 40.0000 mg | ORAL_TABLET | Freq: Every day | ORAL | 3 refills | Status: DC

## 2019-01-27 NOTE — Progress Notes (Signed)
Subjective:    Patient ID: Cory [name], male    DOB: 1966-05-12, 53 y.o.   MRN: [id number]  HPI  Here for wellness and f/u;  Overall doing ok;  Pt denies Chest pain, worsening SOB, DOE, wheezing, orthopnea, PND, worsening LE edema, palpitations, dizziness or syncope.  Pt denies neurological change such as new headache, facial or extremity weakness.  Pt denies polydipsia, polyuria, or low sugar symptoms. Pt states overall good compliance with treatment and medications, good tolerability, and has been trying to follow appropriate diet.  Pt denies worsening depressive symptoms, suicidal ideation or panic. No fever, night sweats, wt loss, loss of appetite, or other constitutional symptoms.  Pt states good ability with ADL's, has low fall risk, home safety reviewed and adequate, no other significant changes in hearing or vision, and only occasionally active with exercise.  Denies worsening reflux, abd pain, dysphagia, n/v, bowel change or blood, but did have a + FOBT testing June 2019 where he states a GI referral was supposed to have been done, but not heard anything.  Recent HA has resolved as per ED visit recent Past Medical History:  Diagnosis Date  . Carpal tunnel syndrome of right wrist   . CHF (congestive heart failure) ([place])   . Cholelithiasis   . Colon polyps 09/09/2013   hyperplastic  . Condyloma   . Diverticulosis 09/09/2013  . ED (erectile dysfunction)   . GERD (gastroesophageal reflux disease)   . Hyperglycemia 04/14/2011  . Hyperlipidemia   . Hypertension   . Hypothyroidism   . Mild stage glaucoma(365.71)    Dr Ricki Miller  . Tobacco abuse    Past Surgical History:  Procedure Laterality Date  . COLONOSCOPY W/ BIOPSIES AND POLYPECTOMY  09/09/2013   hyperplastic polyps    reports that he has been smoking cigarettes. He has a 28.50 pack-year smoking history. He has never used smokeless tobacco. He reports current alcohol use of about 6.0 standard drinks of alcohol per week.  He reports current drug use. Drug: Marijuana. family history includes Alcohol abuse in his brother; Blindness in his father; Breast cancer in his mother; Diabetes in his brother and father; Glaucoma in his father; Heart disease in his father; Hypertension in his father; Seizures in his brother; Stroke in his father. Allergies  Allergen Reactions  . Ace Inhibitors Swelling    Lip swelling    Current Outpatient Medications on File Prior to Visit  Medication Sig Dispense Refill  . dorzolamide (TRUSOPT) 2 % ophthalmic solution     . dorzolamide-timolol (COSOPT) 22.3-6.8 MG/ML ophthalmic solution Place 1 drop into both eyes daily. 10 mL 2  . latanoprost (XALATAN) 0.005 % ophthalmic solution Place 1 drop into both eyes 2 (two) times daily. 2.5 mL 2  . omeprazole (PRILOSEC) 40 MG capsule Take 1 capsule (40 mg total) by mouth daily. 30 capsule 0   No current facility-administered medications on file prior to visit.    Review of Systems Constitutional: Negative for other unusual diaphoresis, sweats, appetite or weight changes HENT: Negative for other worsening hearing loss, ear pain, facial swelling, mouth sores or neck stiffness.   Eyes: Negative for other worsening pain, redness or other visual disturbance.  Respiratory: Negative for other stridor or swelling Cardiovascular: Negative for other palpitations or other chest pain  Gastrointestinal: Negative for worsening diarrhea or loose stools, blood in stool, distention or other pain Genitourinary: Negative for hematuria, flank pain or other change in urine volume.  Musculoskeletal: Negative for myalgias or other joint  swelling.  Skin: Negative for other color change, or other wound or worsening drainage.  Neurological: Negative for other syncope or numbness. Hematological: Negative for other adenopathy or swelling Psychiatric/Behavioral: Negative for hallucinations, other worsening agitation, SI, self-injury, or new decreased concentration All  other system neg per pt    Objective:   Physical Exam BP 118/72   Pulse 64   Temp 98.1 F (36.7 C) (Oral)   Ht 5\' 5"  (1.651 m)   Wt 165 lb (74.8 kg)   SpO2 98%   BMI 27.46 kg/m  VS noted,  Constitutional: Pt is oriented to person, place, and time. Appears well-developed and well-nourished, in no significant distress and comfortable Head: Normocephalic and atraumatic  Eyes: Conjunctivae and EOM are normal. Pupils are equal, round, and reactive to light Right Ear: External ear normal without discharge Left Ear: External ear normal without discharge Nose: Nose without discharge or deformity Mouth/Throat: Oropharynx is without other ulcerations and moist  Neck: Normal range of motion. Neck supple. No JVD present. No tracheal deviation present or significant neck LA or mass Cardiovascular: Normal rate, regular rhythm, normal heart sounds and intact distal pulses.   Pulmonary/Chest: WOB normal and breath sounds without rales or wheezing  Abdominal: Soft. Bowel sounds are normal. NT. No HSM  Musculoskeletal: Normal range of motion. Exhibits no edema Lymphadenopathy: Has no other cervical adenopathy.  Neurological: Pt is alert and oriented to person, place, and time. Pt has normal reflexes. No cranial nerve deficit. Motor grossly intact, Gait intact Skin: Skin is warm and dry. No rash noted or new ulcerations Psychiatric:  Has normal mood and affect. Behavior is normal without agitation No other exam findings Lab Results  Component Value Date   WBC 7.7 01/27/2019   HGB 14.9 01/27/2019   HCT 43.9 01/27/2019   PLT 209.0 01/27/2019   GLUCOSE 103 (H) 01/27/2019   CHOL 168 01/27/2019   TRIG 109.0 01/27/2019   HDL 49.40 01/27/2019   LDLDIRECT 155.6 02/15/2008   LDLCALC 97 01/27/2019   ALT 22 01/27/2019   AST 19 01/27/2019   NA 144 01/27/2019   K 4.1 01/27/2019   CL 109 01/27/2019   CREATININE 1.32 01/27/2019   BUN 14 01/27/2019   CO2 30 01/27/2019   TSH 4.92 (H) 01/27/2019   PSA  2.27 01/27/2019   HGBA1C 5.9 01/27/2019       Assessment & Plan:

## 2019-01-27 NOTE — Assessment & Plan Note (Signed)

## 2019-01-27 NOTE — Assessment & Plan Note (Signed)
stable overall by history and exam, recent data reviewed with pt, and pt to continue medical treatment as before,  to f/u any worsening symptoms or concerns  

## 2019-01-27 NOTE — Patient Instructions (Addendum)
Please continue all other medications as before, and refills have been done if requested.  Please have the pharmacy call with any other refills you may need.  Please continue your efforts at being more active, low cholesterol diet, and weight control.  You are otherwise up to date with prevention measures today.  Please keep your appointments with your specialists as you may have planned  You will be contacted regarding the referral for: Gastroenterology  Please go to the LAB in the Basement (turn left off the elevator) for the tests to be done today  You will be contacted by phone if any changes need to be made immediately.  Otherwise, you will receive a letter about your results with an explanation, but please check with MyChart first.  Please remember to sign up for MyChart if you have not done so, as this will be important to you in the future with finding out test results, communicating by private email, and scheduling acute appointments online when needed.  Please return in 1 year for your yearly visit, or sooner if needed, with Lab testing done 3-5 days before

## 2019-02-12 ENCOUNTER — Other Ambulatory Visit: Payer: Self-pay | Admitting: Internal Medicine

## 2019-02-16 ENCOUNTER — Other Ambulatory Visit: Payer: Self-pay | Admitting: Internal Medicine

## 2019-05-03 ENCOUNTER — Other Ambulatory Visit: Payer: Self-pay | Admitting: Internal Medicine

## 2019-05-06 ENCOUNTER — Ambulatory Visit: Payer: Managed Care, Other (non HMO) | Admitting: Internal Medicine

## 2019-06-10 ENCOUNTER — Ambulatory Visit (INDEPENDENT_AMBULATORY_CARE_PROVIDER_SITE_OTHER)
Admission: RE | Admit: 2019-06-10 | Discharge: 2019-06-10 | Disposition: A | Discharge status: Home / Self Care | Payer: Managed Care, Other (non HMO) | Source: Ambulatory Visit | Attending: Internal Medicine | Admitting: Internal Medicine

## 2019-06-10 ENCOUNTER — Other Ambulatory Visit: Payer: Self-pay

## 2019-06-10 ENCOUNTER — Ambulatory Visit (INDEPENDENT_AMBULATORY_CARE_PROVIDER_SITE_OTHER): Payer: Managed Care, Other (non HMO) | Admitting: Internal Medicine

## 2019-06-10 ENCOUNTER — Encounter: Payer: Self-pay | Admitting: Internal Medicine

## 2019-06-10 DIAGNOSIS — I10 Essential (primary) hypertension: Secondary | ICD-10-CM | POA: Diagnosis not present

## 2019-06-10 DIAGNOSIS — M25561 Pain in right knee: Secondary | ICD-10-CM

## 2019-06-10 DIAGNOSIS — M25511 Pain in right shoulder: Secondary | ICD-10-CM

## 2019-06-10 DIAGNOSIS — R739 Hyperglycemia, unspecified: Secondary | ICD-10-CM | POA: Diagnosis not present

## 2019-06-10 MED ORDER — PRAVASTATIN SODIUM 40 MG PO TABS: 40.0000 mg | ORAL_TABLET | Freq: Every day | ORAL | 3 refills | Status: DC

## 2019-06-10 MED ORDER — LEVOTHYROXINE SODIUM 125 MCG PO TABS: 125.0000 ug | ORAL_TABLET | Freq: Every day | ORAL | 3 refills | Status: DC

## 2019-06-10 MED ORDER — AMLODIPINE BESYLATE 5 MG PO TABS: 5.0000 mg | ORAL_TABLET | Freq: Every day | ORAL | 3 refills | Status: DC

## 2019-06-10 MED ORDER — OMEPRAZOLE 40 MG PO CPDR: 40.0000 mg | DELAYED_RELEASE_CAPSULE | Freq: Every day | ORAL | 3 refills | Status: DC

## 2019-06-10 MED ORDER — MELOXICAM 15 MG PO TABS: 15.0000 mg | ORAL_TABLET | Freq: Every day | ORAL | 1 refills | Status: DC | PRN

## 2019-06-10 NOTE — Assessment & Plan Note (Addendum)
C/w probable AC oint tendonitis vs bicep tendonitis, for nsaid, for right shoudler film, and refer sport medicine

## 2019-06-10 NOTE — Progress Notes (Signed)
Subjective:    Patient ID: Cory [name], male    DOB: Oct 10, 1966, 53 y.o.   MRN: [id number]  HPI  Here with c/o right shoulder pain x 2 wks worsening, sharp, intermittent, worse to abduct and forward elevate the arm as he does at work quite often, but better to rest.   Also c/o right knee pain x 2 mo, more or less constant, dull achy, wax and wanes medially only with small swelling at times, worse to stand all day at work, better to sit.  Pt denies chest pain, increased sob or doe, wheezing, orthopnea, PND, increased LE swelling, palpitations, dizziness or syncope.  Pt denies new neurological symptoms such as new headache, or facial or extremity weakness or numbness   Pt denies polydipsia, polyuria Past Medical History:  Diagnosis Date  . Carpal tunnel syndrome of right wrist   . CHF (congestive heart failure) ([place])   . Cholelithiasis   . Colon polyps 09/09/2013   hyperplastic  . Condyloma   . Diverticulosis 09/09/2013  . ED (erectile dysfunction)   . GERD (gastroesophageal reflux disease)   . Hyperglycemia 04/14/2011  . Hyperlipidemia   . Hypertension   . Hypothyroidism   . Mild stage glaucoma(365.71)    Dr Ricki Miller  . Tobacco abuse    Past Surgical History:  Procedure Laterality Date  . COLONOSCOPY W/ BIOPSIES AND POLYPECTOMY  09/09/2013   hyperplastic polyps    reports that he has been smoking cigarettes. He has a 28.50 pack-year smoking history. He has never used smokeless tobacco. He reports current alcohol use of about 6.0 standard drinks of alcohol per week. He reports current drug use. Drug: Marijuana. family history includes Alcohol abuse in his brother; Blindness in his father; Breast cancer in his mother; Diabetes in his brother and father; Glaucoma in his father; Heart disease in his father; Hypertension in his father; Seizures in his brother; Stroke in his father. Allergies  Allergen Reactions  . Ace Inhibitors Swelling    Lip swelling    Current Outpatient  Medications on File Prior to Visit  Medication Sig Dispense Refill  . dorzolamide (TRUSOPT) 2 % ophthalmic solution     . dorzolamide-timolol (COSOPT) 22.3-6.8 MG/ML ophthalmic solution Place 1 drop into both eyes daily. 10 mL 2  . latanoprost (XALATAN) 0.005 % ophthalmic solution Place 1 drop into both eyes 2 (two) times daily. 2.5 mL 2   No current facility-administered medications on file prior to visit.    Review of Systems  Constitutional: Negative for other unusual diaphoresis or sweats HENT: Negative for ear discharge or swelling Eyes: Negative for other worsening visual disturbances Respiratory: Negative for stridor or other swelling  Gastrointestinal: Negative for worsening distension or other blood Genitourinary: Negative for retention or other urinary change Musculoskeletal: Negative for other MSK pain or swelling Skin: Negative for color change or other new lesions Neurological: Negative for worsening tremors and other numbness  Psychiatric/Behavioral: Negative for worsening agitation or other fatigue All other system neg per pt    Objective:   Physical Exam BP 132/84   Pulse 76   Temp 98.5 F (36.9 C) (Oral)   Ht 5\' 5"  (1.651 m)   Wt 166 lb (75.3 kg)   SpO2 99%   BMI 27.62 kg/m  VS noted,  Constitutional: Pt appears in NAD HENT: Head: NCAT.  Right Ear: External ear normal.  Left Ear: External ear normal.  Eyes: . Pupils are equal, round, and reactive to light. Conjunctivae and EOM are  normal Nose: without d/c or deformity Neck: Neck supple. Gross normal ROM Cardiovascular: Normal rate and regular rhythm.   Pulmonary/Chest: Effort normal and breath sounds without rales or wheezing.  Right shoulder with tender right AC joint o/w shoulder reduced ROM with pain Right knee with moderate bony medial degenerative bony change and enlargement with trace medial swelling and mild tender over the medial joint line, has reduced ROM Neurological: Pt is alert. At baseline  orientation, motor grossly intact Skin: Skin is warm. No rashes, other new lesions, no LE edema Psychiatric: Pt behavior is normal without agitation  No other exam findings Lab Results  Component Value Date   WBC 7.7 01/27/2019   HGB 14.9 01/27/2019   HCT 43.9 01/27/2019   PLT 209.0 01/27/2019   GLUCOSE 103 (H) 01/27/2019   CHOL 168 01/27/2019   TRIG 109.0 01/27/2019   HDL 49.40 01/27/2019   LDLDIRECT 155.6 02/15/2008   LDLCALC 97 01/27/2019   ALT 22 01/27/2019   AST 19 01/27/2019   NA 144 01/27/2019   K 4.1 01/27/2019   CL 109 01/27/2019   CREATININE 1.32 01/27/2019   BUN 14 01/27/2019   CO2 30 01/27/2019   TSH 4.92 (H) 01/27/2019   PSA 2.27 01/27/2019   HGBA1C 5.9 01/27/2019        Assessment & Plan:

## 2019-06-10 NOTE — Patient Instructions (Addendum)
Please take all new medication as prescribed - the anti-inflammatory (mobic)  Please continue all other medications as before, and refills have been done if requested.  Please have the pharmacy call with any other refills you may need.  Please keep your appointments with your specialists as you may have planned  You will be contacted regarding the referral for: Sports Medicine  Please go to the XRAY Department in the Basement (go straight as you get off the elevator) for the x-ray testing  You will be contacted by phone if any changes need to be made immediately.  Otherwise, you will receive a letter about your results with an explanation, but please check with MyChart first.  Please remember to sign up for MyChart if you have not done so, as this will be important to you in the future with finding out test results, communicating by private email, and scheduling acute appointments online when needed.

## 2019-06-10 NOTE — Assessment & Plan Note (Signed)
Medial joint line with trace local swelling and tender, for nsaid prn, right knee film, and refer sport medicine

## 2019-06-11 ENCOUNTER — Encounter: Payer: Self-pay | Admitting: Internal Medicine

## 2019-06-13 ENCOUNTER — Encounter: Payer: Self-pay | Admitting: Internal Medicine

## 2019-06-13 NOTE — Assessment & Plan Note (Signed)
stable overall by history and exam, recent data reviewed with pt, and pt to continue medical treatment as before,  to f/u any worsening symptoms or concerns  

## 2019-07-22 ENCOUNTER — Other Ambulatory Visit: Payer: Self-pay

## 2019-07-22 ENCOUNTER — Ambulatory Visit: Payer: Self-pay

## 2019-07-22 ENCOUNTER — Ambulatory Visit (INDEPENDENT_AMBULATORY_CARE_PROVIDER_SITE_OTHER): Payer: Managed Care, Other (non HMO) | Admitting: Family Medicine

## 2019-07-22 ENCOUNTER — Encounter: Payer: Self-pay | Admitting: Family Medicine

## 2019-07-22 VITALS — BP 138/82 | HR 83 | Ht 65.0 in | Wt 167.0 lb

## 2019-07-22 DIAGNOSIS — G8929 Other chronic pain: Secondary | ICD-10-CM

## 2019-07-22 DIAGNOSIS — M19011 Primary osteoarthritis, right shoulder: Secondary | ICD-10-CM

## 2019-07-22 DIAGNOSIS — M25561 Pain in right knee: Secondary | ICD-10-CM | POA: Diagnosis not present

## 2019-07-22 DIAGNOSIS — M25511 Pain in right shoulder: Secondary | ICD-10-CM | POA: Diagnosis not present

## 2019-07-22 NOTE — Assessment & Plan Note (Signed)
Patient given injection today and tolerated the procedure well.  We discussed icing regimen and home exercises.  Patient will be put on light duty for 1 week and work.  Patient should do well though.  Discussed range of motion exercises, topical anti-inflammatories that are over-the-counter.  Follow-up again with me in 4 weeks

## 2019-07-22 NOTE — Progress Notes (Signed)
[name] [name], [name] [address] Phone: [phone] Subjective:   [name] am serving as a Cory [name] for Cory [name].  Was referred by primary care provider Cory Borg, MD  CC: Knee and shoulder pain  [id number]  Cory [name] is a 53 y.o. male coming in with complaint of right shoulder and knee. Arthritis in both the knee and shoulder.   Onset- shoulder and knee chronic  Location - shoulder joint, knee cap   Character- sharp shoulder and knee pain  Aggravating factors-  Reliving factors-  Therapies tried-   Severity- 8/10 at its worse      Past Medical History:  Diagnosis Date  . Carpal tunnel syndrome of right wrist   . CHF (congestive heart failure) ([place])   . Cholelithiasis   . Colon polyps 09/09/2013   hyperplastic  . Condyloma   . Diverticulosis 09/09/2013  . ED (erectile dysfunction)   . GERD (gastroesophageal reflux disease)   . Hyperglycemia 04/14/2011  . Hyperlipidemia   . Hypertension   . Hypothyroidism   . Mild stage glaucoma(365.71)    Dr Cory [name]  . Tobacco abuse    Past Surgical History:  Procedure Laterality Date  . COLONOSCOPY W/ BIOPSIES AND POLYPECTOMY  09/09/2013   hyperplastic polyps   Social History   Socioeconomic History  . Marital status: Single    Spouse name: Not on file  . Number of children: 4  . Years of Cory: Not on file  . Highest Cory level: Not on file  Occupational History  . 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 Financial resource strain: Not on file  . Food insecurity    Worry: Not on file    Inability: Not on file  . Transportation needs    Medical: Not on file    Non-medical: Not on file  Tobacco Use  . Smoking status: Current Every Day Smoker    Packs/day: 0.75    Years: 38.00    Pack years: 28.50    Types: Cigarettes  . Smokeless tobacco: Never Used  . Tobacco comment: "light"  smoker since age 1  Substance and Sexual Activity  . Alcohol use: Yes    Alcohol/week: 6.0 standard drinks    Types: 6 Cans of beer per week    Comment: 22 ounces daily  . Drug use: Yes    Types: Marijuana    Comment: OCCASIONAL none since 08/11/14  . Sexual activity: Not on file  Lifestyle  . Physical activity    Days per week: Not on file    Minutes per session: Not on file  . Stress: Not on file  Relationships  . Social Herbalist on phone: Not on file    Gets together: Not on file    Attends religious service: Not on file    Active member of club or organization: Not on file    Attends meetings of clubs or organizations: Not on file    Relationship status: Not on file  Other Topics Concern  . Not on file  Social History Narrative   Fun: Relax at home, hang out with kids   Denies religious beliefs effecting health care.    Allergies  Allergen Reactions  . Ace Inhibitors Swelling    Lip swelling    Family History  Problem Relation Age of Onset  . Breast cancer Mother  deceased at age 34 secondary to complications of breast cancer  . Diabetes Father   . Stroke Father   . Glaucoma Father   . Hypertension Father   . Blindness Father        leagally blind  . Heart disease Father   . Diabetes Brother   . Alcohol abuse Brother   . Seizures Brother   . Other Neg Hx        prostate cancer, colon cancer    Current Outpatient Medications (Endocrine & Metabolic):  .  levothyroxine (SYNTHROID) 125 MCG tablet, Take 1 tablet (125 mcg total) by mouth daily.  Current Outpatient Medications (Cardiovascular):  .  amLODipine (NORVASC) 5 MG tablet, Take 1 tablet (5 mg total) by mouth daily. .  pravastatin (PRAVACHOL) 40 MG tablet, Take 1 tablet (40 mg total) by mouth daily.   Current Outpatient Medications (Analgesics):  .  meloxicam (MOBIC) 15 MG tablet, Take 1 tablet (15 mg total) by mouth daily as needed for pain.   Current Outpatient Medications  (Other):  .  dorzolamide (TRUSOPT) 2 % ophthalmic solution,  .  dorzolamide-timolol (COSOPT) 22.3-6.8 MG/ML ophthalmic solution, Place 1 drop into both eyes daily. Marland Kitchen  latanoprost (XALATAN) 0.005 % ophthalmic solution, Place 1 drop into both eyes 2 (two) times daily. Marland Kitchen  omeprazole (PRILOSEC) 40 MG capsule, Take 1 capsule (40 mg total) by mouth daily.    Past medical history, social, surgical and family history all reviewed in electronic medical record.  No pertanent information unless stated regarding to the chief complaint.   Review of Systems:  No headache, visual changes, nausea, vomiting, diarrhea, constipation, dizziness, abdominal pain, skin rash, fevers, chills, night sweats, weight loss, swollen lymph nodes, body aches, joint swelling, muscle aches, chest pain, shortness of breath, mood changes.   Objective  Blood pressure 138/82, pulse 83, height 5\' 5"  (1.651 m), weight 167 lb (75.8 kg), SpO2 98 %. Systems examined below as of    General: No apparent distress alert and oriented x3 mood and affect normal, dressed appropriately.  HEENT: Pupils equal, extraocular movements intact  Respiratory: Patient's speak in full sentences and does not appear short of breath  Cardiovascular: No lower extremity edema, non tender, no erythema  Skin: Warm dry intact with no signs of infection or rash on extremities or on axial skeleton.  Abdomen: Soft nontender  Neuro: Cranial nerves II through XII are intact, neurovascularly intact in all extremities with 2+ DTRs and 2+ pulses.  Lymph: No lymphadenopathy of posterior or anterior cervical chain or axillae bilaterally.  Gait normal with good balance and coordination.  MSK:  Non tender with full range of motion and good stability and symmetric strength and tone of , elbows, wrist, hip, knee and ankles bilaterally.  Shoulder exam shows the patient does have good range of motion.  Positive crossover test.  Tender to palpation over the acromioclavicular  joint.  Mild impingement signs but 5 out of 5 strength of the rotator cuff. Contralateral shoulder unremarkable  Right knee exam has full range of motion.  Mild tenderness to palpation over the medial joint line.  No crepitus noted.  No significant instability.  Procedure: Real-time Ultrasound Guided Injection of right acromioclavicular joint Device: GE Logiq Q7 Ultrasound guided injection is preferred based studies that show increased duration, increased effect, greater accuracy, decreased procedural pain, increased response rate, and decreased cost with ultrasound guided versus blind injection.  Verbal informed consent obtained.  Time-out conducted.  Noted no overlying erythema, induration, or  other signs of local infection.  Skin prepped in a sterile fashion.  Local anesthesia: Topical Ethyl chloride.  With sterile technique and under real time ultrasound guidance: With a 25-gauge half inch needle injected with 0.5 cc of 0.5% Marcaine and 0.5 cc of Kenalog 40 mg/mL Completed without difficulty  Pain immediately resolved suggesting accurate placement of the medication.  Advised to call if fevers/chills, erythema, induration, drainage, or persistent bleeding.  Images permanently stored and available for review in the ultrasound unit.  Impression: Technically successful ultrasound guided injection.   Impression and Recommendations:     This case required medical decision making of moderate complexity. The above documentation has been reviewed and is accurate and complete Lyndal Pulley, DO       Note: This dictation was prepared with Dragon dictation along with smaller phrase technology. Any transcriptional errors that result from this process are unintentional.

## 2019-07-22 NOTE — Patient Instructions (Signed)
Good to see you Ice 20 minutes 2 times daily. Usually after activity and before bed. Voltaren over the counter Exercise 3 times a week See me in 4-5 weeks

## 2019-07-22 NOTE — Assessment & Plan Note (Signed)
Stable at the moment.  No significant change in management.  I do not feel any instability.  Negative McMurray's.  Encourage topical anti-inflammatories but I do not think any further treatment options are necessary with normal x-rays.

## 2020-01-03 ENCOUNTER — Ambulatory Visit: Payer: Managed Care, Other (non HMO) | Admitting: Internal Medicine

## 2020-04-13 ENCOUNTER — Encounter: Payer: Self-pay | Admitting: Internal Medicine

## 2020-04-13 ENCOUNTER — Ambulatory Visit (INDEPENDENT_AMBULATORY_CARE_PROVIDER_SITE_OTHER): Payer: Managed Care, Other (non HMO) | Admitting: Internal Medicine

## 2020-04-13 ENCOUNTER — Other Ambulatory Visit: Payer: Self-pay

## 2020-04-13 ENCOUNTER — Ambulatory Visit: Payer: Managed Care, Other (non HMO) | Admitting: Internal Medicine

## 2020-04-13 VITALS — BP 156/68 | HR 74 | Temp 98.4°F | Ht 65.0 in | Wt 162.0 lb

## 2020-04-13 DIAGNOSIS — F17200 Nicotine dependence, unspecified, uncomplicated: Secondary | ICD-10-CM | POA: Diagnosis not present

## 2020-04-13 DIAGNOSIS — E559 Vitamin D deficiency, unspecified: Secondary | ICD-10-CM

## 2020-04-13 DIAGNOSIS — E538 Deficiency of other specified B group vitamins: Secondary | ICD-10-CM

## 2020-04-13 DIAGNOSIS — R739 Hyperglycemia, unspecified: Secondary | ICD-10-CM | POA: Diagnosis not present

## 2020-04-13 DIAGNOSIS — K921 Melena: Secondary | ICD-10-CM

## 2020-04-13 DIAGNOSIS — Z0001 Encounter for general adult medical examination with abnormal findings: Secondary | ICD-10-CM

## 2020-04-13 DIAGNOSIS — K625 Hemorrhage of anus and rectum: Secondary | ICD-10-CM | POA: Diagnosis not present

## 2020-04-13 LAB — VITAMIN D 25 HYDROXY (VIT D DEFICIENCY, FRACTURES): VITD: 13.27 ng/mL — ABNORMAL LOW (ref 30.00–100.00)

## 2020-04-13 LAB — URINALYSIS, ROUTINE W REFLEX MICROSCOPIC
Bilirubin Urine: NEGATIVE
Ketones, ur: NEGATIVE
Leukocytes,Ua: NEGATIVE
Nitrite: NEGATIVE
Specific Gravity, Urine: >=1.03 — AB (ref 1.000–1.030)
Total Protein, Urine: 100 — AB
Urine Glucose: NEGATIVE
Urobilinogen, UA: 1 (ref 0.0–1.0)
pH: 5.5 (ref 5.0–8.0)

## 2020-04-13 LAB — CBC WITH DIFFERENTIAL/PLATELET
Basophils Absolute: 0 10*3/uL (ref 0.0–0.1)
Basophils Relative: 0.4 % (ref 0.0–3.0)
Eosinophils Absolute: 0.1 10*3/uL (ref 0.0–0.7)
Eosinophils Relative: 0.8 % (ref 0.0–5.0)
HCT: 43.1 % (ref 39.0–52.0)
Hemoglobin: 14.5 g/dL (ref 13.0–17.0)
Lymphocytes Relative: 12.5 % (ref 12.0–46.0)
Lymphs Abs: 1.2 10*3/uL (ref 0.7–4.0)
MCHC: 33.6 g/dL (ref 30.0–36.0)
MCV: 88.8 fl (ref 78.0–100.0)
Monocytes Absolute: 0.8 10*3/uL (ref 0.1–1.0)
Monocytes Relative: 8.2 % (ref 3.0–12.0)
Neutro Abs: 7.7 10*3/uL (ref 1.4–7.7)
Neutrophils Relative %: 78.1 % — ABNORMAL HIGH (ref 43.0–77.0)
Platelets: 209 10*3/uL (ref 150.0–400.0)
RBC: 4.85 Mil/uL (ref 4.22–5.81)
RDW: 13.6 % (ref 11.5–15.5)
WBC: 9.8 10*3/uL (ref 4.0–10.5)

## 2020-04-13 LAB — TSH: TSH: 0.85 u[IU]/mL (ref 0.35–4.50)

## 2020-04-13 LAB — PSA: PSA: 3.04 ng/mL (ref 0.10–4.00)

## 2020-04-13 LAB — IBC PANEL
Iron: 31 ug/dL — ABNORMAL LOW (ref 42–165)
Saturation Ratios: 7.7 % — ABNORMAL LOW (ref 20.0–50.0)
Transferrin: 286 mg/dL (ref 212.0–360.0)

## 2020-04-13 LAB — FERRITIN: Ferritin: 94.7 ng/mL (ref 22.0–322.0)

## 2020-04-13 LAB — HEPATIC FUNCTION PANEL
ALT: 17 U/L (ref 0–53)
AST: 16 U/L (ref 0–37)
Albumin: 4.3 g/dL (ref 3.5–5.2)
Alkaline Phosphatase: 68 U/L (ref 39–117)
Bilirubin, Direct: 0.1 mg/dL (ref 0.0–0.3)
Total Bilirubin: 0.6 mg/dL (ref 0.2–1.2)
Total Protein: 7.6 g/dL (ref 6.0–8.3)

## 2020-04-13 LAB — BASIC METABOLIC PANEL
BUN: 9 mg/dL (ref 6–23)
CO2: 28 mEq/L (ref 19–32)
Calcium: 9.1 mg/dL (ref 8.4–10.5)
Chloride: 102 mEq/L (ref 96–112)
Creatinine, Ser: 1.06 mg/dL (ref 0.40–1.50)
GFR: 87.4 mL/min (ref >60.00)
Glucose, Bld: 115 mg/dL — ABNORMAL HIGH (ref 70–99)
Potassium: 3.6 mEq/L (ref 3.5–5.1)
Sodium: 134 mEq/L — ABNORMAL LOW (ref 135–145)

## 2020-04-13 LAB — PROTIME-INR
INR: 1.2 ratio — ABNORMAL HIGH (ref 0.8–1.0)
Prothrombin Time: 13.4 s — ABNORMAL HIGH (ref 9.6–13.1)

## 2020-04-13 LAB — LIPID PANEL
Cholesterol: 188 mg/dL (ref 0–200)
HDL: 64.2 mg/dL (ref >39.00)
LDL Cholesterol: 111 mg/dL — ABNORMAL HIGH (ref 0–99)
NonHDL: 123.91
Total CHOL/HDL Ratio: 3
Triglycerides: 63 mg/dL (ref 0.0–149.0)
VLDL: 12.6 mg/dL (ref 0.0–40.0)

## 2020-04-13 LAB — VITAMIN B12: Vitamin B-12: 292 pg/mL (ref 211–911)

## 2020-04-13 MED ORDER — CHANTIX STARTING MONTH PAK 0.5 MG X 11 & 1 MG X 42 PO TABS: ORAL_TABLET | ORAL | 0 refills | Status: DC

## 2020-04-13 MED ORDER — OMEPRAZOLE 40 MG PO CPDR: 40.0000 mg | DELAYED_RELEASE_CAPSULE | Freq: Every day | ORAL | 3 refills | Status: DC

## 2020-04-13 MED ORDER — VARENICLINE TARTRATE 1 MG PO TABS: 1.0000 mg | ORAL_TABLET | Freq: Two times a day (BID) | ORAL | 1 refills | Status: DC

## 2020-04-13 NOTE — Patient Instructions (Signed)
Please take all new medication as prescribed - the chantix for smoking  You are given the work note today  Please continue all other medications as before, and refills have been done if requested.  Please have the pharmacy call with any other refills you may need.  Please continue your efforts at being more active, low cholesterol diet, and weight control.  You are otherwise up to date with prevention measures today.  Please keep your appointments with your specialists as you may have planned  You will be contacted regarding the referral for: Dr [facility]  Please go to the LAB at the blood drawing area for the tests to be done  You will be contacted by phone if any changes need to be made immediately.  Otherwise, you will receive a letter about your results with an explanation, but please check with MyChart first.  Please remember to sign up for MyChart if you have not done so, as this will be important to you in the future with finding out test results, communicating by private email, and scheduling acute appointments online when needed.  Please make an Appointment to return in 6 months, or sooner if needed

## 2020-04-13 NOTE — Progress Notes (Signed)
Subjective:    Patient ID: Cory [name], male    DOB: 07/28/1966, 54 y.o.   MRN: [id number]  HPI  Here for wellness and f/u;  Overall doing ok;  Pt denies Chest pain, worsening SOB, DOE, wheezing, orthopnea, PND, worsening LE edema, palpitations, dizziness or syncope.  Pt denies neurological change such as new headache, facial or extremity weakness.  Pt denies polydipsia, polyuria, or low sugar symptoms. Pt states overall good compliance with treatment and medications, good tolerability, and has been trying to follow appropriate diet.  Pt denies worsening depressive symptoms, suicidal ideation or panic. No fever, night sweats, wt loss, loss of appetite, or other constitutional symptoms.  Pt states good ability with ADL's, has low fall risk, home safety reviewed and adequate, no other significant changes in hearing or vision, and only occasionally active with exercise. Denies worsening reflux, abd pain, dysphagia, n/v, bowel change, but has small hemtochezia daily x 3 days with liguid stools, small llQ pain, restarted his prilosec 40 qd, but no further pain or bleeding today.  Also asks for chantix to help quit smoking Past Medical History:  Diagnosis Date  . Carpal tunnel syndrome of right wrist   . CHF (congestive heart failure) ([place])   . Cholelithiasis   . Colon polyps 09/09/2013   hyperplastic  . Condyloma   . Diverticulosis 09/09/2013  . ED (erectile dysfunction)   . GERD (gastroesophageal reflux disease)   . Hyperglycemia 04/14/2011  . Hyperlipidemia   . Hypertension   . Hypothyroidism   . Mild stage glaucoma(365.71)    Dr Ricki Miller  . Tobacco abuse    Past Surgical History:  Procedure Laterality Date  . COLONOSCOPY W/ BIOPSIES AND POLYPECTOMY  09/09/2013   hyperplastic polyps    reports that he has been smoking cigarettes. He has a 28.50 pack-year smoking history. He has never used smokeless tobacco. He reports current alcohol use of about 6.0 standard drinks of alcohol per  week. He reports current drug use. Drug: Marijuana. family history includes Alcohol abuse in his brother; Blindness in his father; Breast cancer in his mother; Diabetes in his brother and father; Glaucoma in his father; Heart disease in his father; Hypertension in his father; Seizures in his brother; Stroke in his father. Allergies  Allergen Reactions  . Ace Inhibitors Swelling    Lip swelling    Current Outpatient Medications on File Prior to Visit  Medication Sig Dispense Refill  . amLODipine (NORVASC) 5 MG tablet Take 1 tablet (5 mg total) by mouth daily. 90 tablet 3  . dorzolamide (TRUSOPT) 2 % ophthalmic solution     . dorzolamide-timolol (COSOPT) 22.3-6.8 MG/ML ophthalmic solution Place 1 drop into both eyes daily. 10 mL 2  . latanoprost (XALATAN) 0.005 % ophthalmic solution Place 1 drop into both eyes 2 (two) times daily. 2.5 mL 2  . levothyroxine (SYNTHROID) 125 MCG tablet Take 1 tablet (125 mcg total) by mouth daily. 90 tablet 3  . pravastatin (PRAVACHOL) 40 MG tablet Take 1 tablet (40 mg total) by mouth daily. 90 tablet 3   No current facility-administered medications on file prior to visit.   Review of Systems All otherwise neg per pt     Objective:   Physical Exam BP (!) 156/68 (BP Location: Left Arm, Patient Position: Sitting, Cuff Size: Large)   Pulse 74   Temp 98.4 F (36.9 C) (Oral)   Ht 5\' 5"  (1.651 m)   Wt 162 lb (73.5 kg)   SpO2 98%  BMI 26.96 kg/m  VS noted,  Constitutional: Pt appears in NAD HENT: Head: NCAT.  Right Ear: External ear normal.  Left Ear: External ear normal.  Eyes: . Pupils are equal, round, and reactive to light. Conjunctivae and EOM are normal Nose: without d/c or deformity Neck: Neck supple. Gross normal ROM Cardiovascular: Normal rate and regular rhythm.   Pulmonary/Chest: Effort normal and breath sounds without rales or wheezing.  Abd:  Soft, NT, ND, + BS, no organomegaly Neurological: Pt is alert. At baseline orientation, motor  grossly intact Skin: Skin is warm. No rashes, other new lesions, no LE edema Psychiatric: Pt behavior is normal without agitation  All otherwise neg per pt Lab Results  Component Value Date   WBC 9.8 04/13/2020   HGB 14.5 04/13/2020   HCT 43.1 04/13/2020   PLT 209.0 04/13/2020   GLUCOSE 115 (H) 04/13/2020   CHOL 188 04/13/2020   TRIG 63.0 04/13/2020   HDL 64.20 04/13/2020   LDLDIRECT 155.6 02/15/2008   LDLCALC 111 (H) 04/13/2020   ALT 17 04/13/2020   AST 16 04/13/2020   NA 134 (L) 04/13/2020   K 3.6 04/13/2020   CL 102 04/13/2020   CREATININE 1.06 04/13/2020   BUN 9 04/13/2020   CO2 28 04/13/2020   TSH 0.85 04/13/2020   PSA 3.04 04/13/2020   INR 1.2 (H) 04/13/2020   HGBA1C 5.9 01/27/2019      Assessment & Plan:

## 2020-04-16 ENCOUNTER — Encounter: Payer: Self-pay | Admitting: Internal Medicine

## 2020-04-16 ENCOUNTER — Other Ambulatory Visit: Payer: Self-pay | Admitting: Internal Medicine

## 2020-04-16 MED ORDER — VITAMIN D (ERGOCALCIFEROL) 1.25 MG (50000 UNIT) PO CAPS: 50000.0000 [IU] | ORAL_CAPSULE | ORAL | 0 refills | Status: DC

## 2020-04-16 NOTE — Assessment & Plan Note (Signed)
Likely hemorrhoidal, for lab today, work note, cont ppi, and f/u gi as planned  I spent 31 minutes in addition to time for CPX wellness examination in preparing to see the patient by review of recent labs, imaging and procedures, obtaining and reviewing separately obtained history, communicating with the patient and family or caregiver, ordering medications, tests or procedures, and documenting clinical information in the EHR including the differential Dx, treatment, and any further evaluation and other management of hematochezia, hyperglycemia, vit d def, smoker counseling and tx

## 2020-04-16 NOTE — Assessment & Plan Note (Signed)
Cont oral replacement 

## 2020-04-16 NOTE — Assessment & Plan Note (Signed)

## 2020-04-16 NOTE — Assessment & Plan Note (Signed)
For chantix asd, to f/u any worsening symptoms or concerns

## 2020-04-16 NOTE — Assessment & Plan Note (Signed)
stable overall by history and exam, recent data reviewed with pt, and pt to continue medical treatment as before,  to f/u any worsening symptoms or concerns  

## 2020-05-22 ENCOUNTER — Ambulatory Visit: Payer: Managed Care, Other (non HMO) | Admitting: Nurse Practitioner

## 2020-06-13 ENCOUNTER — Other Ambulatory Visit: Payer: Self-pay | Admitting: Internal Medicine

## 2020-06-13 NOTE — Telephone Encounter (Signed)
Please refill as per office routine med refill policy (all routine meds refilled for 3 mo or monthly per pt preference up to one year from last visit, then month to month grace period for 3 mo, then further med refills will have to be denied)  

## 2020-06-26 ENCOUNTER — Ambulatory Visit: Payer: Managed Care, Other (non HMO) | Admitting: Physician Assistant

## 2020-07-03 ENCOUNTER — Other Ambulatory Visit: Payer: Self-pay | Admitting: Internal Medicine

## 2020-07-03 NOTE — Telephone Encounter (Signed)
Please refill as per office routine med refill policy (all routine meds refilled for 3 mo or monthly per pt preference up to one year from last visit, then month to month grace period for 3 mo, then further med refills will have to be denied) - the levothyroxine only  Please change to OTC Vitamin D3 at 2000 units per day, indefinitely.

## 2020-08-22 ENCOUNTER — Ambulatory Visit
Admission: RE | Admit: 2020-08-22 | Discharge: 2020-08-22 | Disposition: A | Discharge status: Home / Self Care | Payer: Managed Care, Other (non HMO) | Source: Ambulatory Visit | Attending: Family | Admitting: Family

## 2020-08-22 ENCOUNTER — Other Ambulatory Visit: Payer: Self-pay

## 2020-08-22 ENCOUNTER — Encounter: Payer: Self-pay | Admitting: Family

## 2020-08-22 ENCOUNTER — Ambulatory Visit (INDEPENDENT_AMBULATORY_CARE_PROVIDER_SITE_OTHER): Payer: Managed Care, Other (non HMO) | Admitting: Family

## 2020-08-22 VITALS — BP 122/74 | HR 84 | Temp 98.1°F | Ht 65.0 in | Wt 160.0 lb

## 2020-08-22 DIAGNOSIS — R109 Unspecified abdominal pain: Secondary | ICD-10-CM

## 2020-08-22 MED ORDER — IOPAMIDOL (ISOVUE-300) INJECTION 61%
100.0000 mL | Freq: Once | INTRAVENOUS | Status: AC | PRN
  Administered 2020-08-22: 100 mL via INTRAVENOUS

## 2020-08-22 NOTE — Progress Notes (Signed)
Cory [name] is a 54 y.o. male with the following history as recorded in EpicCare:  Patient Active Problem List   Diagnosis Date Noted  . Hematochezia 04/13/2020  . Arthritis of right acromioclavicular joint 07/22/2019  . Right shoulder pain 06/10/2019  . Right knee pain 06/10/2019  . Headache 01/27/2019  . Vitamin D deficiency 01/11/2018  . Angioedema 01/11/2018  . Right ear impacted cerumen 12/30/2017  . Toe pain, right 12/30/2017  . Tinea cruris 04/08/2017  . Numbness of fingers 04/08/2017  . Nausea with vomiting 02/14/2016  . Diarrhea 02/14/2016  . Elbow strain 12/29/2015  . Abdominal tenderness, unspecified site 12/12/2015  . Gastroesophageal reflux disease with esophagitis 12/12/2015  . Dysphagia 12/12/2015  . Numbness and tingling in right hand 10/24/2015  . Muscle cramps 10/24/2015  . Rectal bleeding 07/09/2013  . Encounter for well adult exam with abnormal findings 03/19/2013  . Hemorrhoids, internal, with bleeding, and prolapse 08/26/2011  . Hyperglycemia 04/14/2011  . CARPAL TUNNEL SYNDROME, RIGHT 01/08/2011  . Essential hypertension 09/17/2010  . CONDYLOMA 07/30/2010  . BACK PAIN 07/23/2010  . HAND PAIN, RIGHT 07/23/2010  . ERECTILE DYSFUNCTION, ORGANIC 03/12/2010  . HOARSENESS 05/12/2009  . Hyperlipidemia 01/07/2008  . TOBACCO ABUSE 01/07/2008  . Hypothyroidism 07/04/2007  . CHOLELITHIASIS 07/04/2007    Current Outpatient Medications  Medication Sig Dispense Refill  . amLODipine (NORVASC) 5 MG tablet TAKE ONE TABLET BY MOUTH DAILY 30 tablet 3  . dorzolamide (TRUSOPT) 2 % ophthalmic solution     . dorzolamide-timolol (COSOPT) 22.3-6.8 MG/ML ophthalmic solution Place 1 drop into both eyes daily. 10 mL 2  . latanoprost (XALATAN) 0.005 % ophthalmic solution Place 1 drop into both eyes 2 (two) times daily. 2.5 mL 2  . levothyroxine (SYNTHROID) 125 MCG tablet TAKE ONE TABLET BY MOUTH DAILY 30 tablet 2  . pravastatin (PRAVACHOL) 40 MG tablet Take 1 tablet (40  mg total) by mouth daily. 90 tablet 3  . Vitamin D, Ergocalciferol, (DRISDOL) 1.25 MG (50000 UNIT) CAPS capsule Take 1 capsule (50,000 Units total) by mouth every 7 (seven) days. 12 capsule 0  . omeprazole (PRILOSEC) 40 MG capsule Take 1 capsule (40 mg total) by mouth daily. (Patient not taking: Reported on 08/22/2020) 90 capsule 3   No current facility-administered medications for this visit.    Allergies: Ace inhibitors  Past Medical History:  Diagnosis Date  . Carpal tunnel syndrome of right wrist   . CHF (congestive heart failure) ([place])   . Cholelithiasis   . Colon polyps 09/09/2013   hyperplastic  . Condyloma   . Diverticulosis 09/09/2013  . ED (erectile dysfunction)   . GERD (gastroesophageal reflux disease)   . Hyperglycemia 04/14/2011  . Hyperlipidemia   . Hypertension   . Hypothyroidism   . Mild stage glaucoma(365.71)    Dr Ricki Miller  . Tobacco abuse     Past Surgical History:  Procedure Laterality Date  . COLONOSCOPY W/ BIOPSIES AND POLYPECTOMY  09/09/2013   hyperplastic polyps    Family History  Problem Relation Age of Onset  . Breast cancer Mother        deceased at age 15 secondary to complications of breast cancer  . Diabetes Father   . Stroke Father   . Glaucoma Father   . Hypertension Father   . Blindness Father        leagally blind  . Heart disease Father   . Diabetes Brother   . Alcohol abuse Brother   . Seizures Brother   .  Other Neg Hx        prostate cancer, colon cancer    Social History   Tobacco Use  . Smoking status: Current Every Day Smoker    Packs/day: 0.75    Years: 38.00    Pack years: 28.50    Types: Cigarettes  . Smokeless tobacco: Never Used  . Tobacco comment: "light" smoker since age 53  Substance Use Topics  . Alcohol use: Yes    Alcohol/week: 6.0 standard drinks    Types: 6 Cans of beer per week    Comment: 22 ounces daily    Subjective:  Patient has been having 2 day history of abdominal pain; is concerned that  the pain is "related to his ulcers." Admits that stress level has been very high recently; not currently taking his OTC Omeprazole;  Denies any vomiting or nausea; some diarrhea; notes that appetite has been down; no dark, tarry stool; no coffee grounds emesis;     Objective:  Vitals:   08/22/20 1053  BP: 122/74  Pulse: 84  Temp: 98.1 F (36.7 C)  TempSrc: Oral  SpO2: 99%  Weight: 160 lb (72.6 kg)  Height: _0  (1.651 m)    General: Well developed, well nourished, in mild distress- is visibly in pain/ hunched over Skin : Warm and dry.  Head: Normocephalic and atraumatic  Eyes: Sclera and conjunctiva clear; pupils round and reactive to light; extraocular movements intact  Ears: External normal; canals clear; tympanic membranes normal  Oropharynx: Pink, supple. No suspicious lesions  Neck: Supple without thyromegaly, adenopathy  Lungs: Respirations unlabored; clear to auscultation bilaterally without wheeze, rales, rhonchi  CVS exam: normal rate and regular rhythm.  Abdomen: Soft but guarding noted; tenderness over RUQ, LUQ and LLQ on exam; Musculoskeletal: No deformities; no active joint inflammation  Extremities: No edema, cyanosis, clubbing  Vessels: Symmetric bilaterally  Neurologic: Alert and oriented; speech intact; face symmetrical; moves all extremities well; CNII-XII intact without focal deficit   Assessment:  1. Abdominal pain, unspecified abdominal location     Plan:  Concern for pancreatitis vs diverticulitis; need to get STAT CT abd/ pelvis today; will check labs as well; follow up to be determined;  This visit occurred during the SARS-CoV-2 public health emergency.  Safety protocols were in place, including screening questions prior to the visit, additional usage of staff PPE, and extensive cleaning of exam room while observing appropriate contact time as indicated for disinfecting solutions.     No follow-ups on file.  Orders Placed This Encounter  Procedures   . CT Abdomen Pelvis W Contrast    Standing Status:   Future    Standing Expiration Date:   08/22/2021    Order Specific Question:   If indicated for the ordered procedure, I authorize the administration of contrast media per Radiology protocol    Answer:   Yes    Order Specific Question:   Preferred imaging location?    Answer:   [id number]    Order Specific Question:   Is Oral Contrast requested for this exam?    Answer:   Yes, Per Radiology protocol  . CBC with Differential/Platelet    Standing Status:   Future    Standing Expiration Date:   08/22/2021  . Comp Met (CMET)    Standing Status:   Future    Standing Expiration Date:   08/22/2021  . Amylase    Standing Status:   Future    Standing Expiration Date:   08/22/2021  .  Lipase    Standing Status:   Future    Standing Expiration Date:   08/22/2021    Requested Prescriptions    No prescriptions requested or ordered in this encounter

## 2020-08-23 ENCOUNTER — Other Ambulatory Visit: Payer: Self-pay | Admitting: Family

## 2020-08-23 MED ORDER — PANTOPRAZOLE SODIUM 40 MG PO TBEC: 40.0000 mg | DELAYED_RELEASE_TABLET | Freq: Two times a day (BID) | ORAL | 0 refills | Status: DC

## 2020-09-05 ENCOUNTER — Other Ambulatory Visit: Payer: Self-pay | Admitting: Internal Medicine

## 2020-09-05 NOTE — Telephone Encounter (Signed)
Please refill as per office routine med refill policy (all routine meds refilled for 3 mo or monthly per pt preference up to one year from last visit, then month to month grace period for 3 mo, then further med refills will have to be denied)  

## 2020-09-11 ENCOUNTER — Ambulatory Visit: Payer: Managed Care, Other (non HMO) | Admitting: Physician Assistant

## 2020-10-05 ENCOUNTER — Other Ambulatory Visit: Payer: Self-pay | Admitting: Internal Medicine

## 2020-10-08 ENCOUNTER — Other Ambulatory Visit: Payer: Self-pay | Admitting: Internal Medicine

## 2020-10-08 NOTE — Telephone Encounter (Signed)
Please refill as per office routine med refill policy (all routine meds refilled for 3 mo or monthly per pt preference up to one year from last visit, then month to month grace period for 3 mo, then further med refills will have to be denied)  

## 2021-02-20 ENCOUNTER — Telehealth: Payer: Self-pay | Admitting: Internal Medicine

## 2021-02-20 MED ORDER — LEVOTHYROXINE SODIUM 125 MCG PO TABS: 125.0000 ug | ORAL_TABLET | Freq: Every day | ORAL | 0 refills | Status: DC

## 2021-02-20 NOTE — Telephone Encounter (Signed)
   Patient states he has no medication remaining  Requesting refill today for levothyroxine (SYNTHROID) 125 MCG tablet  Pharmacy [facility] [place] [address], [place]

## 2021-02-20 NOTE — Telephone Encounter (Signed)
Tried calling patient to notify of prescription. No answer. Patient also needs to schedule appointment. Due for 54month f/u

## 2021-02-22 ENCOUNTER — Other Ambulatory Visit: Payer: Self-pay

## 2021-03-29 ENCOUNTER — Other Ambulatory Visit: Payer: Self-pay | Admitting: Internal Medicine

## 2021-05-08 ENCOUNTER — Other Ambulatory Visit: Payer: Self-pay | Admitting: Internal Medicine

## 2021-05-08 NOTE — Telephone Encounter (Signed)
Please refill as per office routine med refill policy (all routine meds refilled for 3 mo or monthly per pt preference up to one year from last visit, then month to month grace period for 3 mo, then further med refills will have to be denied)  

## 2021-05-21 ENCOUNTER — Ambulatory Visit: Payer: Managed Care, Other (non HMO) | Admitting: Internal Medicine

## 2021-05-23 ENCOUNTER — Telehealth: Payer: Self-pay

## 2021-05-23 MED ORDER — AMLODIPINE BESYLATE 5 MG PO TABS
5.0000 mg | ORAL_TABLET | Freq: Every day | ORAL | 0 refills | Status: DC
Start: 2021-05-23 — End: 2021-06-19

## 2021-05-23 NOTE — Telephone Encounter (Signed)
-----   Message from Marguarite Arbour sent at 05/23/2021  9:16 AM EDT ----- Refill request

## 2021-05-28 ENCOUNTER — Ambulatory Visit: Payer: Managed Care, Other (non HMO) | Admitting: Internal Medicine

## 2021-06-02 ENCOUNTER — Other Ambulatory Visit: Payer: Self-pay | Admitting: Internal Medicine

## 2021-06-03 NOTE — Telephone Encounter (Signed)
Please refill as per office routine med refill policy (all routine meds refilled for 3 mo or monthly per pt preference up to one year from last visit, then month to month grace period for 3 mo, then further med refills will have to be denied)  

## 2021-06-11 ENCOUNTER — Ambulatory Visit: Payer: Managed Care, Other (non HMO) | Admitting: Internal Medicine

## 2021-06-18 ENCOUNTER — Telehealth: Payer: Self-pay | Admitting: Internal Medicine

## 2021-06-18 ENCOUNTER — Ambulatory Visit: Payer: Managed Care, Other (non HMO) | Admitting: Internal Medicine

## 2021-06-18 ENCOUNTER — Other Ambulatory Visit: Payer: Self-pay | Admitting: Internal Medicine

## 2021-06-18 NOTE — Telephone Encounter (Signed)
1.Medication Requested: amLODipine (NORVASC) 5 MG tablet  2. Pharmacy (Name, [place]):  [facility] [id number] - [place], [place] Phone:  [phone]  Fax:  [phone]      3. On Med List: yes  4. Last Visit with PCP: 06.10.21  5. Next visit date with PCP: 08.29.22   Agent: Please be advised that RX refills may take up to 3 business days. We ask that you follow-up with your pharmacy.

## 2021-06-18 NOTE — Telephone Encounter (Signed)
Please refill as per office routine med refill policy (all routine meds refilled for 3 mo or monthly per pt preference up to one year from last visit, then month to month grace period for 3 mo, then further med refills will have to be denied)  

## 2021-06-19 MED ORDER — AMLODIPINE BESYLATE 5 MG PO TABS
5.0000 mg | ORAL_TABLET | Freq: Every day | ORAL | 0 refills | Status: DC
Start: 2021-06-19 — End: 2021-06-25

## 2021-06-20 ENCOUNTER — Other Ambulatory Visit: Payer: Self-pay | Admitting: Internal Medicine

## 2021-06-20 NOTE — Telephone Encounter (Signed)
Please refill as per office routine med refill policy (all routine meds refilled for 3 mo or monthly per pt preference up to one year from last visit, then month to month grace period for 3 mo, then further med refills will have to be denied)  

## 2021-06-21 ENCOUNTER — Telehealth: Payer: Self-pay

## 2021-06-21 NOTE — Telephone Encounter (Signed)
Short fill has been denied. 3 30 day supplies were sent. Patient notified office visit is need multiple times. AVS states he needed to be seen back in 6 months.

## 2021-06-21 NOTE — Telephone Encounter (Signed)
Follow up message    Patient states he has no Levothyroxine remaining . He is requesting short supply until 8/29 appointment

## 2021-06-22 NOTE — Telephone Encounter (Signed)
Pt calling upset & yelling with concerns that his short supply for Levothyroxine will not be filled. Last OV June 2021.  Per refill protocol, medication has been refilled once for 30 days. Pt also had previous appts on 7/18, 7/25, & 8/8 that were canceled by patient, 06/18/21 appt canceled due to provider emergency.  Appt moved up to 8/29 & explained to patient that once thyroid labs have been drawn medication can be refilled by provider; explained rationale is for patient's safety as dose may need to be adjusted for whatever reason.  Pt remains upset & states he will notify PCP of incident.

## 2021-06-25 ENCOUNTER — Other Ambulatory Visit: Payer: Self-pay

## 2021-06-25 ENCOUNTER — Ambulatory Visit (INDEPENDENT_AMBULATORY_CARE_PROVIDER_SITE_OTHER): Payer: Managed Care, Other (non HMO) | Admitting: Internal Medicine

## 2021-06-25 VITALS — BP 144/70 | HR 74 | Temp 98.3°F | Resp 18 | Ht 66.0 in | Wt 156.0 lb

## 2021-06-25 DIAGNOSIS — R739 Hyperglycemia, unspecified: Secondary | ICD-10-CM

## 2021-06-25 DIAGNOSIS — E538 Deficiency of other specified B group vitamins: Secondary | ICD-10-CM | POA: Diagnosis not present

## 2021-06-25 DIAGNOSIS — I10 Essential (primary) hypertension: Secondary | ICD-10-CM | POA: Diagnosis not present

## 2021-06-25 DIAGNOSIS — E611 Iron deficiency: Secondary | ICD-10-CM | POA: Diagnosis not present

## 2021-06-25 DIAGNOSIS — E039 Hypothyroidism, unspecified: Secondary | ICD-10-CM

## 2021-06-25 DIAGNOSIS — K921 Melena: Secondary | ICD-10-CM

## 2021-06-25 DIAGNOSIS — E559 Vitamin D deficiency, unspecified: Secondary | ICD-10-CM | POA: Diagnosis not present

## 2021-06-25 DIAGNOSIS — E782 Mixed hyperlipidemia: Secondary | ICD-10-CM | POA: Diagnosis not present

## 2021-06-25 DIAGNOSIS — F17200 Nicotine dependence, unspecified, uncomplicated: Secondary | ICD-10-CM

## 2021-06-25 DIAGNOSIS — Z0001 Encounter for general adult medical examination with abnormal findings: Secondary | ICD-10-CM | POA: Diagnosis not present

## 2021-06-25 LAB — URINALYSIS, ROUTINE W REFLEX MICROSCOPIC
Bilirubin Urine: NEGATIVE
Ketones, ur: NEGATIVE
Leukocytes,Ua: NEGATIVE
Nitrite: NEGATIVE
RBC / HPF: NONE SEEN (ref 0–?)
Specific Gravity, Urine: >=1.03 — AB (ref 1.000–1.030)
Total Protein, Urine: NEGATIVE
Urine Glucose: 500 — AB
Urobilinogen, UA: 0.2 (ref 0.0–1.0)
WBC, UA: NONE SEEN (ref 0–?)
pH: 5.5 (ref 5.0–8.0)

## 2021-06-25 LAB — CBC WITH DIFFERENTIAL/PLATELET
Basophils Absolute: 0 10*3/uL (ref 0.0–0.1)
Basophils Relative: 0.5 % (ref 0.0–3.0)
Eosinophils Absolute: 0 10*3/uL (ref 0.0–0.7)
Eosinophils Relative: 0.8 % (ref 0.0–5.0)
HCT: 43.8 % (ref 39.0–52.0)
Hemoglobin: 14.5 g/dL (ref 13.0–17.0)
Lymphocytes Relative: 19.6 % (ref 12.0–46.0)
Lymphs Abs: 1.2 10*3/uL (ref 0.7–4.0)
MCHC: 33.2 g/dL (ref 30.0–36.0)
MCV: 89.5 fl (ref 78.0–100.0)
Monocytes Absolute: 0.5 10*3/uL (ref 0.1–1.0)
Monocytes Relative: 8.4 % (ref 3.0–12.0)
Neutro Abs: 4.2 10*3/uL (ref 1.4–7.7)
Neutrophils Relative %: 70.7 % (ref 43.0–77.0)
Platelets: 221 10*3/uL (ref 150.0–400.0)
RBC: 4.89 Mil/uL (ref 4.22–5.81)
RDW: 14 % (ref 11.5–15.5)
WBC: 5.9 10*3/uL (ref 4.0–10.5)

## 2021-06-25 LAB — TSH: TSH: 4.66 u[IU]/mL (ref 0.35–5.50)

## 2021-06-25 LAB — LIPID PANEL
Cholesterol: 200 mg/dL (ref 0–200)
HDL: 72.5 mg/dL (ref >39.00)
LDL Cholesterol: 111 mg/dL — ABNORMAL HIGH (ref 0–99)
NonHDL: 127.71
Total CHOL/HDL Ratio: 3
Triglycerides: 85 mg/dL (ref 0.0–149.0)
VLDL: 17 mg/dL (ref 0.0–40.0)

## 2021-06-25 LAB — HEPATIC FUNCTION PANEL
ALT: 18 U/L (ref 0–53)
AST: 20 U/L (ref 0–37)
Albumin: 4.2 g/dL (ref 3.5–5.2)
Alkaline Phosphatase: 55 U/L (ref 39–117)
Bilirubin, Direct: 0.1 mg/dL (ref 0.0–0.3)
Total Bilirubin: 0.4 mg/dL (ref 0.2–1.2)
Total Protein: 7.9 g/dL (ref 6.0–8.3)

## 2021-06-25 LAB — BASIC METABOLIC PANEL
BUN: 14 mg/dL (ref 6–23)
CO2: 26 mEq/L (ref 19–32)
Calcium: 9.3 mg/dL (ref 8.4–10.5)
Chloride: 104 mEq/L (ref 96–112)
Creatinine, Ser: 1.35 mg/dL (ref 0.40–1.50)
GFR: 58.35 mL/min — ABNORMAL LOW (ref >60.00)
Glucose, Bld: 85 mg/dL (ref 70–99)
Potassium: 3.8 mEq/L (ref 3.5–5.1)
Sodium: 139 mEq/L (ref 135–145)

## 2021-06-25 LAB — VITAMIN B12: Vitamin B-12: 337 pg/mL (ref 211–911)

## 2021-06-25 LAB — IBC PANEL
Iron: 91 ug/dL (ref 42–165)
Saturation Ratios: 22.1 % (ref 20.0–50.0)
TIBC: 411.6 ug/dL (ref 250.0–450.0)
Transferrin: 294 mg/dL (ref 212.0–360.0)

## 2021-06-25 LAB — VITAMIN D 25 HYDROXY (VIT D DEFICIENCY, FRACTURES): VITD: 18.61 ng/mL — ABNORMAL LOW (ref 30.00–100.00)

## 2021-06-25 LAB — FERRITIN: Ferritin: 103.9 ng/mL (ref 22.0–322.0)

## 2021-06-25 LAB — PSA: PSA: 2.42 ng/mL (ref 0.10–4.00)

## 2021-06-25 MED ORDER — LEVOTHYROXINE SODIUM 125 MCG PO TABS: 125.0000 ug | ORAL_TABLET | Freq: Every day | ORAL | 3 refills | Status: DC

## 2021-06-25 MED ORDER — AMLODIPINE BESYLATE 5 MG PO TABS
5.0000 mg | ORAL_TABLET | Freq: Every day | ORAL | 3 refills | Status: DC
Start: 2021-06-25 — End: 2022-11-29

## 2021-06-25 MED ORDER — PRAVASTATIN SODIUM 40 MG PO TABS
40.0000 mg | ORAL_TABLET | Freq: Every day | ORAL | 3 refills | Status: DC
Start: 2021-06-25 — End: 2021-11-28

## 2021-06-25 NOTE — Patient Instructions (Addendum)
Please be aware of the new Novavax covid vax coming out in October [id number]  Please consider calling the insurance to see if the Shingrix shingles shot is covered  Please take OTC Vitamin D3 at 2000 units per day, indefinitely  Please stop smoking, and let us know if you would want the Chantix to help with this  Please check your BP at home, with the goal being to be at least less than 140/90  Please continue all other medications as before, and refills have been done if requested.  Please have the pharmacy call with any other refills you may need.  Please continue your efforts at being more active, low cholesterol diet, and weight control.  You are otherwise up to date with prevention measures today.  Please keep your appointments with your specialists as you may have planned  You will be contacted regarding the referral for: Gastroenterology  Please go to the LAB at the blood drawing area for the tests to be done  You will be contacted by phone if any changes need to be made immediately.  Otherwise, you will receive a letter about your results with an explanation, but please check with MyChart first  Please remember to sign up for MyChart if you have not done so, as this will be important to you in the future with finding out test results, communicating by private email, and scheduling acute appointments online when needed.  Please make an Appointment to return in 6 months, or sooner if needed

## 2021-06-26 ENCOUNTER — Encounter: Payer: Self-pay | Admitting: Internal Medicine

## 2021-06-28 NOTE — Assessment & Plan Note (Signed)
Age and sex appropriate education and counseling updated with regular exercise and diet Referrals for preventative services - none needed Immunizations addressed - declines covid booster, flu shot, shingrix, pneumovax Smoking counseling  - counseled to quit, pt not ready Evidence for depression or other mood disorder - none significant Most recent labs reviewed. I have personally reviewed and have noted: 1) the patient's medical and social history 2) The patient's current medications and supplements 3) The patient's height, weight, and BMI have been recorded in the chart

## 2021-06-28 NOTE — Assessment & Plan Note (Signed)
BP Readings from Last 3 Encounters:  06/25/21 (!) 144/70  08/22/20 122/74  04/13/20 (!) 156/68   Mild uncontolled, pt states < 140/90 at home,, pt to continue medical treatment norvasc

## 2021-06-28 NOTE — Assessment & Plan Note (Signed)
Last vitamin D Lab Results  Component Value Date   VD25OH 18.61 (L) 06/25/2021   Low, to start oral replacement

## 2021-06-28 NOTE — Progress Notes (Signed)
Patient ID: Cory [name], male   DOB: 10-20-1966, 55 y.o.   MRN: [id number]         Chief Complaint:: wellness exam and htn, tobacco, low vit d, low iron       HPI:  AMAUD [name] is a 55 y.o. male here for wellness exam; declines, covid booster, flu shot, shingrix, pneurmovax ow up to date with preventive referrals and immunizations                        Also smoking but not ready to quit.  Not taking Vit D.  Not taking iron, has had small volume intermittent painless BRBPR, due to see GI but need re-referral.  BP < 140/90 at home.  Pt denies chest pain, increased sob or doe, wheezing, orthopnea, PND, increased LE swelling, palpitations, dizziness or syncope.   Pt denies polydipsia, polyuria, or new focal neuro s/s.   Pt denies fever, wt loss, night sweats, loss of appetite, or other constitutional symptoms  No other new complaints  Wt Readings from Last 3 Encounters:  06/25/21 156 lb (70.8 kg)  08/22/20 160 lb (72.6 kg)  04/13/20 162 lb (73.5 kg)   BP Readings from Last 3 Encounters:  06/25/21 (!) 144/70  08/22/20 122/74  04/13/20 (!) 156/68   Immunization History  Administered Date(s) Administered   Influenza Whole 07/17/2009, 07/30/2010   Influenza,inj,Quad PF,6+ Mos 07/09/2013, 12/12/2015   Influenza-Unspecified 09/04/2017   Janssen (J&J) SARS-COV-2 Vaccination 02/11/2020   Pneumococcal Polysaccharide-23 07/17/2009   Td 01/24/2003   Tdap 12/12/2015  There are no preventive care reminders to display for this patient.    Past Medical History:  Diagnosis Date   Carpal tunnel syndrome of right wrist    CHF (congestive heart failure) ([place])    Cholelithiasis    Colon polyps 09/09/2013   hyperplastic   Condyloma    Diverticulosis 09/09/2013   ED (erectile dysfunction)    GERD (gastroesophageal reflux disease)    Hyperglycemia 04/14/2011   Hyperlipidemia    Hypertension    Hypothyroidism    Mild stage glaucoma(365.71)    Dr Ricki Miller   Tobacco abuse    Past  Surgical History:  Procedure Laterality Date   COLONOSCOPY W/ BIOPSIES AND POLYPECTOMY  09/09/2013   hyperplastic polyps    reports that he has been smoking cigarettes. He has a 28.50 pack-year smoking history. He has never used smokeless tobacco. He reports current alcohol use of about 6.0 standard drinks per week. He reports current drug use. Drug: Marijuana. family history includes Alcohol abuse in his brother; Blindness in his father; Breast cancer in his mother; Diabetes in his brother and father; Glaucoma in his father; Heart disease in his father; Hypertension in his father; Seizures in his brother; Stroke in his father. Allergies  Allergen Reactions   Ace Inhibitors Swelling    Lip swelling    Current Outpatient Medications on File Prior to Visit  Medication Sig Dispense Refill   dorzolamide (TRUSOPT) 2 % ophthalmic solution      dorzolamide-timolol (COSOPT) 22.3-6.8 MG/ML ophthalmic solution Place 1 drop into both eyes daily. 10 mL 2   latanoprost (XALATAN) 0.005 % ophthalmic solution Place 1 drop into both eyes 2 (two) times daily. 2.5 mL 2   Cholecalciferol (THERA-D 2000) 50 MCG (2000 UT) TABS 1 tab by mouth one daily (Patient not taking: Reported on 06/25/2021) 90 tablet 3   No current facility-administered medications on file prior to visit.  ROS:  All others reviewed and negative.  Objective        PE:  BP (!) 144/70 (BP Location: Left Arm)   Pulse 74   Temp 98.3 F (36.8 C)   Resp 18   Ht '5\' 6"'$  (1.676 m)   Wt 156 lb (70.8 kg)   SpO2 99%   BMI 25.18 kg/m                 Constitutional: Pt appears in NAD               HENT: Head: NCAT.                Right Ear: External ear normal.                 Left Ear: External ear normal.                Eyes: . Pupils are equal, round, and reactive to light. Conjunctivae and EOM are normal               Nose: without d/c or deformity               Neck: Neck supple. Gross normal ROM               Cardiovascular:  Normal rate and regular rhythm.                 Pulmonary/Chest: Effort normal and breath sounds without rales or wheezing.                Abd:  Soft, NT, ND, + BS, no organomegaly               Neurological: Pt is alert. At baseline orientation, motor grossly intact               Skin: Skin is warm. No rashes, no other new lesions, LE edema - none               Psychiatric: Pt behavior is normal without agitation   Micro: none  Cardiac tracings I have personally interpreted today:  none  Pertinent Radiological findings (summarize): none   Lab Results  Component Value Date   WBC 5.9 06/25/2021   HGB 14.5 06/25/2021   HCT 43.8 06/25/2021   PLT 221.0 06/25/2021   GLUCOSE 85 06/25/2021   CHOL 200 06/25/2021   TRIG 85.0 06/25/2021   HDL 72.50 06/25/2021   LDLDIRECT 155.6 02/15/2008   LDLCALC 111 (H) 06/25/2021   ALT 18 06/25/2021   AST 20 06/25/2021   NA 139 06/25/2021   K 3.8 06/25/2021   CL 104 06/25/2021   CREATININE 1.35 06/25/2021   BUN 14 06/25/2021   CO2 26 06/25/2021   TSH 4.66 06/25/2021   PSA 2.42 06/25/2021   INR 1.2 (H) 04/13/2020   HGBA1C 5.9 01/27/2019   Assessment/Plan:  Cory [name] is a 56 y.o. Black or African American [2] male with  has a past medical history of Carpal tunnel syndrome of right wrist, CHF (congestive heart failure) ([place]), Cholelithiasis, Colon polyps (09/09/2013), Condyloma, Diverticulosis (09/09/2013), ED (erectile dysfunction), GERD (gastroesophageal reflux disease), Hyperglycemia (04/14/2011), Hyperlipidemia, Hypertension, Hypothyroidism, Mild stage glaucoma(365.71), and Tobacco abuse.  Encounter for well adult exam with abnormal findings Age and sex appropriate education and counseling updated with regular exercise and diet Referrals for preventative services - none needed Immunizations addressed - declines covid booster, flu shot, shingrix, pneumovax Smoking counseling  - counseled  to quit, pt not ready Evidence for depression or  other mood disorder - none significant Most recent labs reviewed. I have personally reviewed and have noted: 1) the patient's medical and social history 2) The patient's current medications and supplements 3) The patient's height, weight, and BMI have been recorded in the chart   Vitamin D deficiency Last vitamin D Lab Results  Component Value Date   VD25OH 18.61 (L) 06/25/2021   Low, to start oral replacement   TOBACCO ABUSE Pt counseled to quit, pt not ready at this time  Iron deficiency For GI referral  Hypothyroidism Lab Results  Component Value Date   TSH 4.66 06/25/2021   Stable, pt to continue levothyroxine  Hypertension, uncontrolled BP Readings from Last 3 Encounters:  06/25/21 (!) 144/70  08/22/20 122/74  04/13/20 (!) 156/68   Mild uncontolled, pt states < 140/90 at home,, pt to continue medical treatment norvasc   Hyperlipidemia Lab Results  Component Value Date   LDLCALC 111 (H) 06/25/2021   Mild uncontrolled,goal ldl < 100, pt to continue current statin pravachol declines change for now  Hyperglycemia Lab Results  Component Value Date   HGBA1C 5.9 01/27/2019   Stable, pt to continue current medical treatment  - diet   Hematochezia Small volume, for GI referral  Followup: No follow-ups on file.  Cathlean Cower, MD 06/28/2021 10:08 PM [facility]

## 2021-06-28 NOTE — Assessment & Plan Note (Signed)
For GI referral

## 2021-06-28 NOTE — Assessment & Plan Note (Signed)
Lab Results  Component Value Date   HGBA1C 5.9 01/27/2019   Stable, pt to continue current medical treatment  - diet

## 2021-06-28 NOTE — Assessment & Plan Note (Signed)
Pt counseled to quit, pt not ready at this time

## 2021-06-28 NOTE — Assessment & Plan Note (Signed)
Lab Results  Component Value Date   TSH 4.66 06/25/2021   Stable, pt to continue levothyroxine

## 2021-06-28 NOTE — Assessment & Plan Note (Signed)
Small volume, for GI referral

## 2021-06-28 NOTE — Assessment & Plan Note (Signed)
Lab Results  Component Value Date   LDLCALC 111 (H) 06/25/2021   Mild uncontrolled,goal ldl < 100, pt to continue current statin pravachol declines change for now

## 2021-07-02 ENCOUNTER — Telehealth: Payer: Self-pay

## 2021-07-02 ENCOUNTER — Ambulatory Visit: Payer: Managed Care, Other (non HMO) | Admitting: Internal Medicine

## 2021-07-02 NOTE — Telephone Encounter (Signed)
Spoke with pt and was able to inform him of his results and Dr. Gwynn Burly advice.  *Pt also updated phone number on file.

## 2021-07-27 IMAGING — DX RIGHT SHOULDER - 2+ VIEW
3 series · 3 of 3 positions shown · non-contrast
Comparison: None.

CLINICAL DATA: Anterior right pain.  No known injury.

EXAM:
RIGHT SHOULDER - 2+ VIEW

[grashey (1 of 2)]
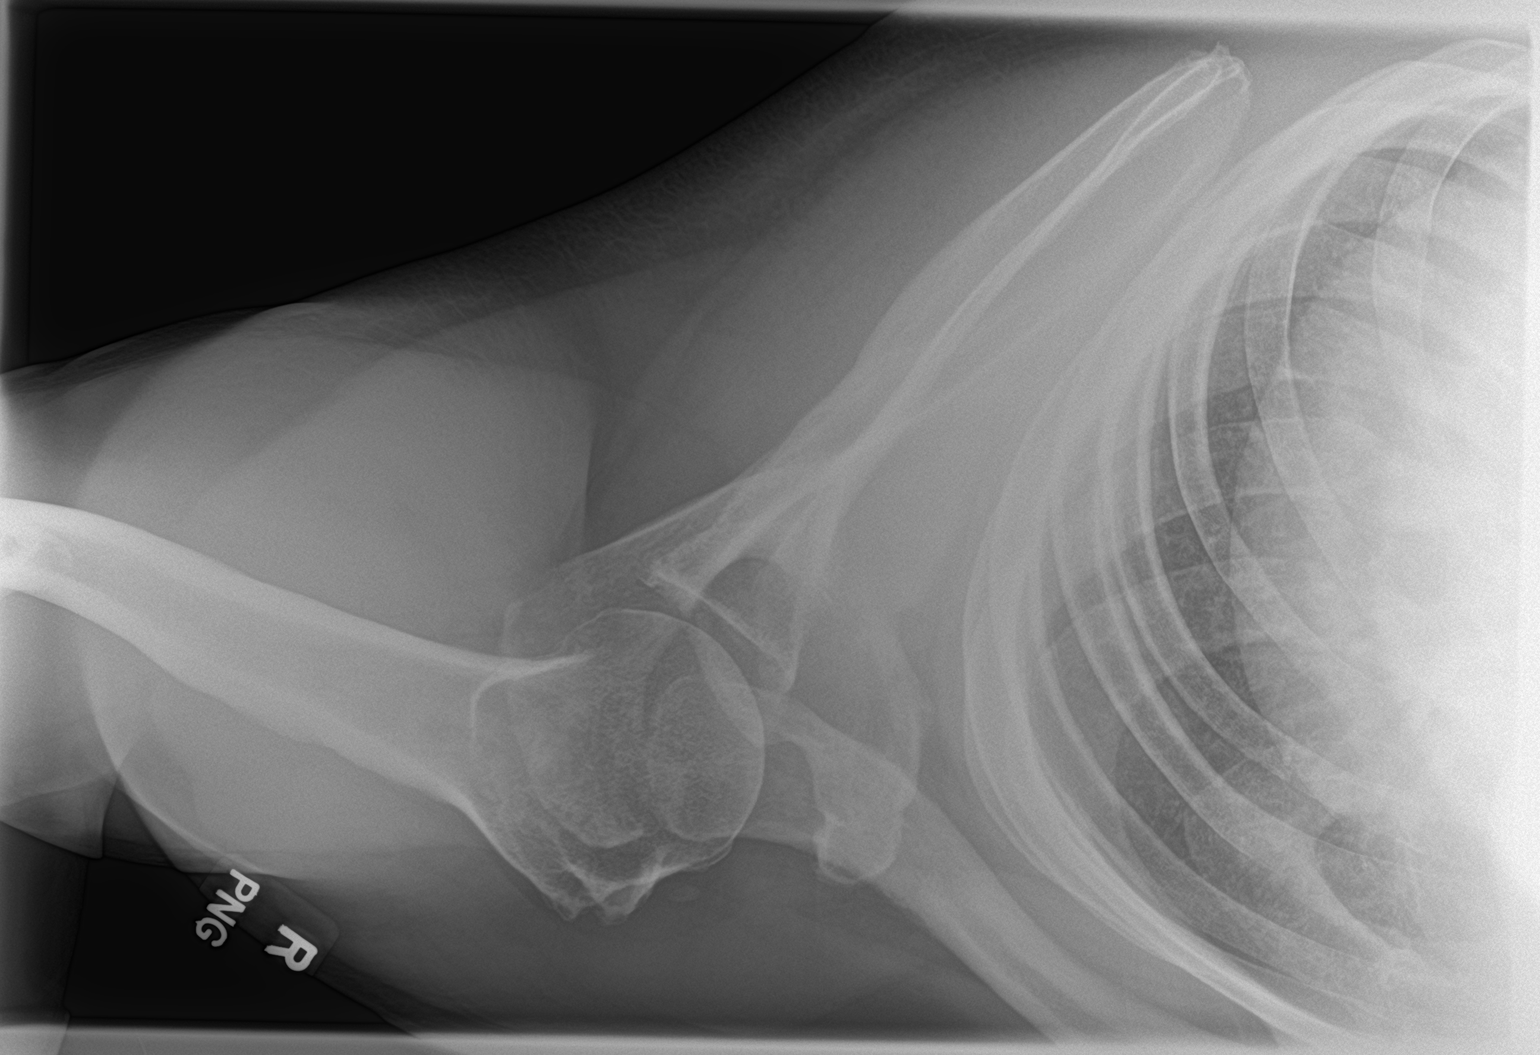

[y view]
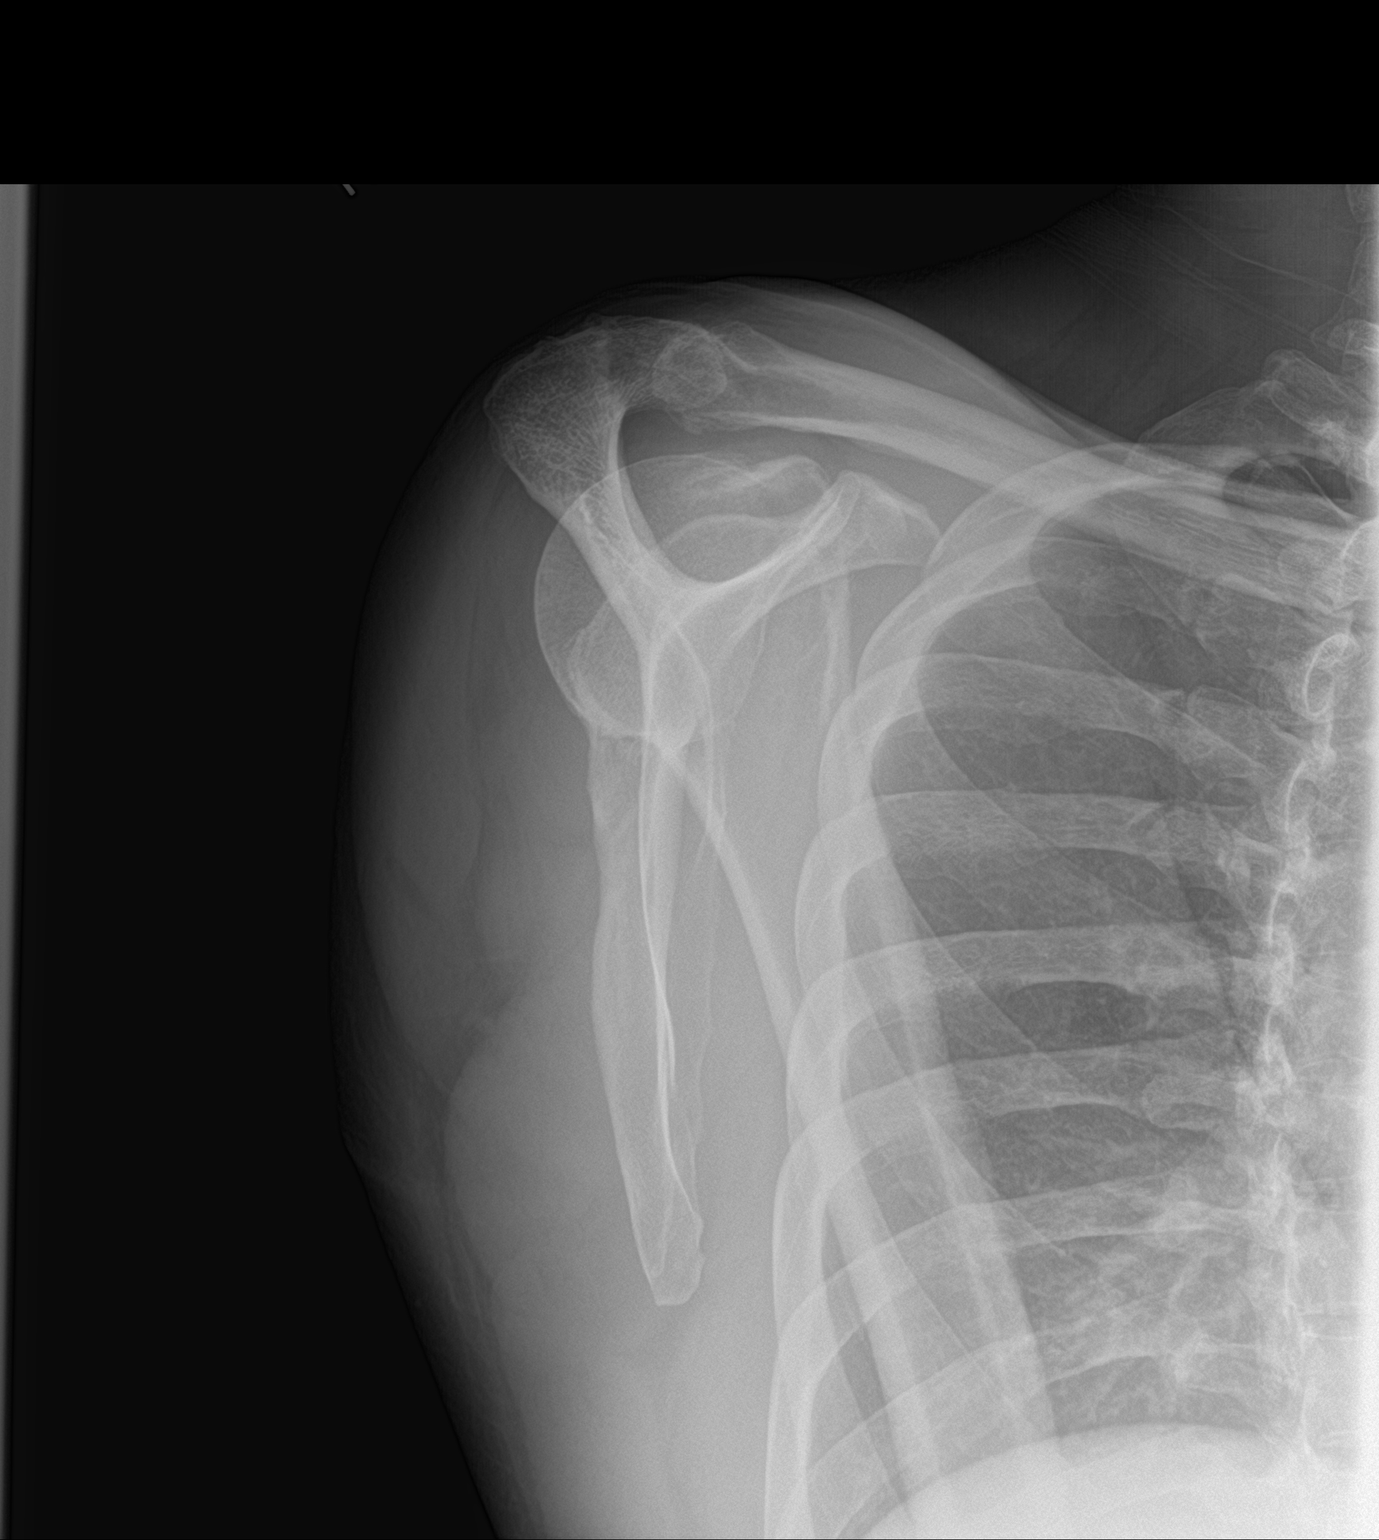

[grashey (2 of 2)]
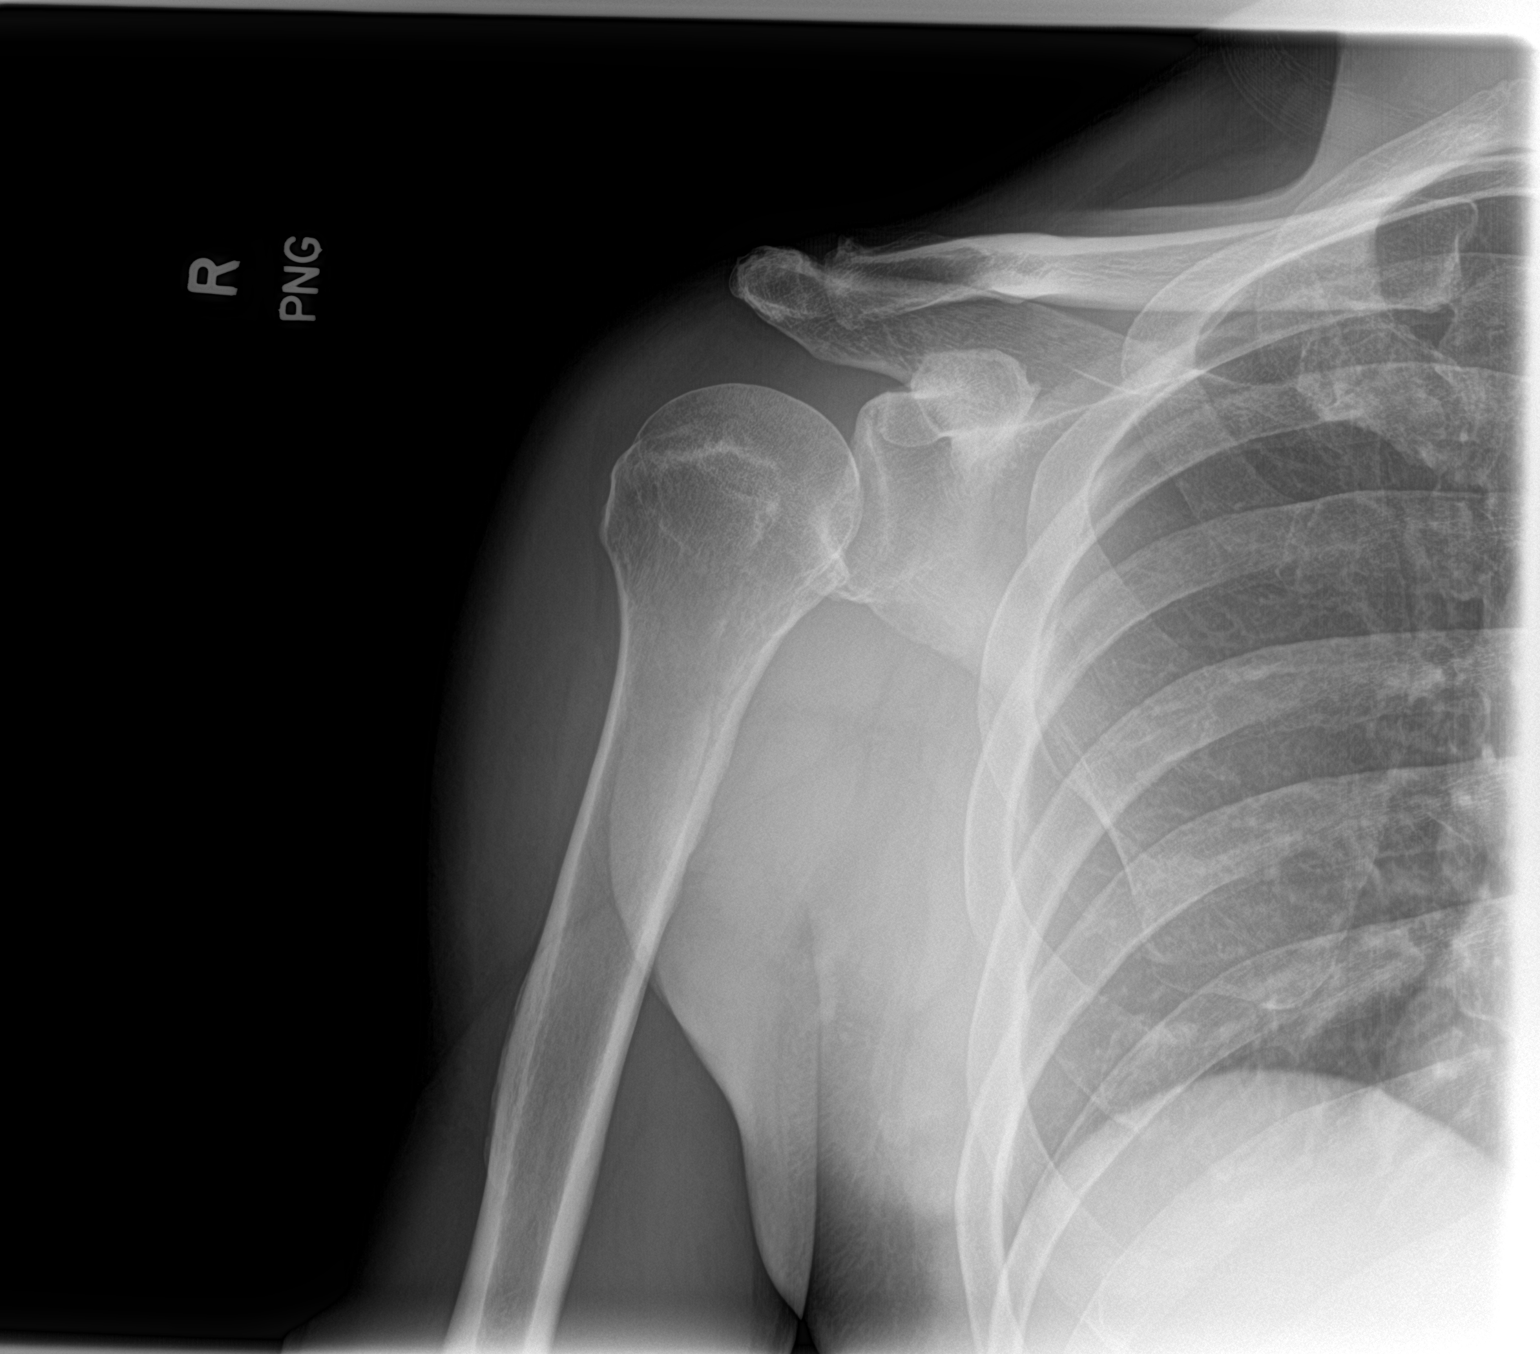

[3 of 3 positions shown; findings below may reference images not displayed]

FINDINGS: Degenerative changes in the AC joint with joint space narrowing and
spurring. Glenohumeral joint is maintained. No acute bony
abnormality. Specifically, no fracture, subluxation, or dislocation.
Soft tissues are intact.
IMPRESSION: Degenerative changes in the right AC joint. No acute bony
abnormality.

## 2021-08-21 ENCOUNTER — Other Ambulatory Visit (HOSPITAL_COMMUNITY): Payer: Self-pay

## 2021-08-21 MED ORDER — DORZOLAMIDE HCL 2 % OP SOLN
OPHTHALMIC | 2 refills | Status: AC
  Filled 2021-10-10: qty 10, 50d supply, fill #0

## 2021-08-21 MED ORDER — CHOLECALCIFEROL 50 MCG (2000 UT) PO TABS: 1.0000 | ORAL_TABLET | Freq: Every day | ORAL | 3 refills | Status: AC

## 2021-08-21 MED ORDER — LATANOPROST 0.005 % OP SOLN
OPHTHALMIC | 3 refills | Status: AC
  Filled 2021-08-30: qty 2.5, 25d supply, fill #0
  Filled 2021-10-23: qty 2.5, 25d supply, fill #1

## 2021-08-21 MED ORDER — PRAVASTATIN SODIUM 40 MG PO TABS
40.0000 mg | ORAL_TABLET | Freq: Every day | ORAL | 2 refills | Status: DC
  Filled 2021-10-10: qty 90, 90d supply, fill #0

## 2021-08-21 MED ORDER — AMLODIPINE BESYLATE 5 MG PO TABS
5.0000 mg | ORAL_TABLET | Freq: Every day | ORAL | 2 refills | Status: DC
  Filled 2021-08-21 – 2021-10-23 (×2): qty 90, 90d supply, fill #0
  Filled 2022-01-30: qty 90, 90d supply, fill #1
  Filled 2022-05-01: qty 90, 90d supply, fill #2

## 2021-08-21 MED ORDER — PANTOPRAZOLE SODIUM 40 MG PO TBEC: 40.0000 mg | DELAYED_RELEASE_TABLET | Freq: Two times a day (BID) | ORAL | 0 refills | Status: DC

## 2021-08-21 MED ORDER — LEVOTHYROXINE SODIUM 125 MCG PO TABS
125.0000 ug | ORAL_TABLET | Freq: Every day | ORAL | 3 refills | Status: DC
  Filled 2021-08-21 – 2021-08-30 (×2): qty 90, 90d supply, fill #0
  Filled 2021-12-04: qty 90, 90d supply, fill #1
  Filled 2022-03-06: qty 30, 30d supply, fill #2
  Filled 2022-04-03: qty 30, 30d supply, fill #3
  Filled 2022-05-01: qty 30, 30d supply, fill #4
  Filled 2022-06-05: qty 30, 30d supply, fill #5

## 2021-08-30 ENCOUNTER — Other Ambulatory Visit (HOSPITAL_COMMUNITY): Payer: Self-pay

## 2021-10-10 ENCOUNTER — Other Ambulatory Visit (HOSPITAL_COMMUNITY): Payer: Self-pay

## 2021-10-23 ENCOUNTER — Other Ambulatory Visit (HOSPITAL_COMMUNITY): Payer: Self-pay

## 2021-10-31 ENCOUNTER — Encounter: Payer: Self-pay | Admitting: Internal Medicine

## 2021-11-27 ENCOUNTER — Encounter: Payer: Self-pay | Admitting: Internal Medicine

## 2021-11-27 ENCOUNTER — Ambulatory Visit (INDEPENDENT_AMBULATORY_CARE_PROVIDER_SITE_OTHER): Payer: 59 | Admitting: Internal Medicine

## 2021-11-27 ENCOUNTER — Other Ambulatory Visit: Payer: Self-pay

## 2021-11-27 VITALS — BP 118/70 | HR 65 | Ht 66.0 in | Wt 161.6 lb

## 2021-11-27 DIAGNOSIS — R739 Hyperglycemia, unspecified: Secondary | ICD-10-CM

## 2021-11-27 DIAGNOSIS — E538 Deficiency of other specified B group vitamins: Secondary | ICD-10-CM

## 2021-11-27 DIAGNOSIS — E559 Vitamin D deficiency, unspecified: Secondary | ICD-10-CM | POA: Diagnosis not present

## 2021-11-27 DIAGNOSIS — E782 Mixed hyperlipidemia: Secondary | ICD-10-CM

## 2021-11-27 DIAGNOSIS — F17200 Nicotine dependence, unspecified, uncomplicated: Secondary | ICD-10-CM

## 2021-11-27 DIAGNOSIS — L84 Corns and callosities: Secondary | ICD-10-CM | POA: Diagnosis not present

## 2021-11-27 DIAGNOSIS — Z0001 Encounter for general adult medical examination with abnormal findings: Secondary | ICD-10-CM

## 2021-11-27 NOTE — Assessment & Plan Note (Signed)
Pt counsled to quit, pt not ready °

## 2021-11-27 NOTE — Progress Notes (Deleted)
Patient ID: Cory [name], male   DOB: [birth year], 56 y.o.   MRN: [id number]        Chief Complaint: follow up HTN, HLD and hyperglycemia ***       HPI:  Cory [name] is a 56 y.o. male here with c/o        Wt Readings from Last 3 Encounters:  11/27/21 161 lb 9.6 oz (73.3 kg)  06/25/21 156 lb (70.8 kg)  08/22/20 160 lb (72.6 kg)   BP Readings from Last 3 Encounters:  11/27/21 118/70  06/25/21 (!) 144/70  08/22/20 122/74         Past Medical History:  Diagnosis Date   Carpal tunnel syndrome of right wrist    CHF (congestive heart failure) ([place])    Cholelithiasis    Colon polyps 09/09/2013   hyperplastic   Condyloma    Diverticulosis 09/09/2013   ED (erectile dysfunction)    GERD (gastroesophageal reflux disease)    Hyperglycemia 04/14/2011   Hyperlipidemia    Hypertension    Hypothyroidism    Mild stage glaucoma(365.71)    Dr Ricki Miller   Tobacco abuse    Past Surgical History:  Procedure Laterality Date   COLONOSCOPY W/ BIOPSIES AND POLYPECTOMY  09/09/2013   hyperplastic polyps    reports that he has been smoking cigarettes. He has a 28.50 pack-year smoking history. He has never used smokeless tobacco. He reports current alcohol use of about 6.0 standard drinks per week. He reports current drug use. Drug: Marijuana. family history includes Alcohol abuse in his brother; Blindness in his father; Breast cancer in his mother; Diabetes in his brother and father; Glaucoma in his father; Heart disease in his father; Hypertension in his father; Seizures in his brother; Stroke in his father. Allergies  Allergen Reactions   Ace Inhibitors Swelling    Lip swelling    Current Outpatient Medications on File Prior to Visit  Medication Sig Dispense Refill   amLODipine (NORVASC) 5 MG tablet Take 1 tablet (5 mg total) by mouth daily. 90 tablet 3   amLODipine (NORVASC) 5 MG tablet Take 1 tablet (5 mg total) by mouth daily. 90 tablet 2   Cholecalciferol 50 MCG (2000 UT) TABS  Take 1 tablet by mouth daily. 90 tablet 3   dorzolamide (TRUSOPT) 2 % ophthalmic solution      dorzolamide (TRUSOPT) 2 % ophthalmic solution Instill 1 drop into the affected eye(s) twice daily. 10 mL 2   dorzolamide-timolol (COSOPT) 22.3-6.8 MG/ML ophthalmic solution Place 1 drop into both eyes daily. 10 mL 2   latanoprost (XALATAN) 0.005 % ophthalmic solution Place 1 drop into both eyes 2 (two) times daily. 2.5 mL 2   latanoprost (XALATAN) 0.005 % ophthalmic solution Instill 1 drop to the affected eye(s) every night at bedtime. 2.5 mL 3   levothyroxine (SYNTHROID) 125 MCG tablet Take 1 tablet (125 mcg total) by mouth daily. 90 tablet 3   levothyroxine (SYNTHROID) 125 MCG tablet Take 1 tablet (125 mcg total) by mouth daily. 90 tablet 3   pantoprazole (PROTONIX) 40 MG tablet Take 1 tablet by mouth 2 times daily. 60 tablet 0   pravastatin (PRAVACHOL) 40 MG tablet Take 1 tablet (40 mg total) by mouth daily. 90 tablet 3   pravastatin (PRAVACHOL) 40 MG tablet Take 1 tablet by mouth daily. 90 tablet 2   No current facility-administered medications on file prior to visit.        ROS:  All others reviewed and negative.  Objective        PE:  BP 118/70 (BP Location: Right Arm, Patient Position: Sitting, Cuff Size: Normal)    Pulse 65    Ht 5\' 6"  (1.676 m)    Wt 161 lb 9.6 oz (73.3 kg)    SpO2 99%    BMI 26.08 kg/m                 Constitutional: Pt appears in NAD               HENT: Head: NCAT.                Right Ear: External ear normal.                 Left Ear: External ear normal.                Eyes: . Pupils are equal, round, and reactive to light. Conjunctivae and EOM are normal               Nose: without d/c or deformity               Neck: Neck supple. Gross normal ROM               Cardiovascular: Normal rate and regular rhythm.                 Pulmonary/Chest: Effort normal and breath sounds without rales or wheezing.                Abd:  Soft, NT, ND, + BS, no organomegaly                Neurological: Pt is alert. At baseline orientation, motor grossly intact               Skin: Skin is warm. No rashes, no other new lesions, LE edema - ***               Psychiatric: Pt behavior is normal without agitation   Micro: none  Cardiac tracings I have personally interpreted today:  none  Pertinent Radiological findings (summarize): none   Lab Results  Component Value Date   WBC 5.9 06/25/2021   HGB 14.5 06/25/2021   HCT 43.8 06/25/2021   PLT 221.0 06/25/2021   GLUCOSE 85 06/25/2021   CHOL 200 06/25/2021   TRIG 85.0 06/25/2021   HDL 72.50 06/25/2021   LDLDIRECT 155.6 02/15/2008   LDLCALC 111 (H) 06/25/2021   ALT 18 06/25/2021   AST 20 06/25/2021   NA 139 06/25/2021   K 3.8 06/25/2021   CL 104 06/25/2021   CREATININE 1.35 06/25/2021   BUN 14 06/25/2021   CO2 26 06/25/2021   TSH 4.66 06/25/2021   PSA 2.42 06/25/2021   INR 1.2 (H) 04/13/2020   HGBA1C 5.9 01/27/2019   Assessment/Plan:  Cory [name] is a 57 y.o. Black or African American [2] male with  has a past medical history of Carpal tunnel syndrome of right wrist, CHF (congestive heart failure) ([place]), Cholelithiasis, Colon polyps (09/09/2013), Condyloma, Diverticulosis (09/09/2013), ED (erectile dysfunction), GERD (gastroesophageal reflux disease), Hyperglycemia (04/14/2011), Hyperlipidemia, Hypertension, Hypothyroidism, Mild stage glaucoma(365.71), and Tobacco abuse.  No problem-specific Assessment & Plan notes found for this encounter.  Followup: No follow-ups on file.  Cathlean Cower, MD 11/27/2021 4:06 PM [facility]

## 2021-11-27 NOTE — Assessment & Plan Note (Signed)
Lab Results  Component Value Date   LDLCALC 111 (H) 06/25/2021   Uncontrolled,  pt to continue current pravachol 40 as declines change for now

## 2021-11-27 NOTE — Assessment & Plan Note (Signed)
Lab Results  Component Value Date   HGBA1C 5.9 01/27/2019   Stable, pt to continue current medical treatment  - diet

## 2021-11-27 NOTE — Progress Notes (Signed)
Patient ID: Cory [name], male   DOB: [birth year], 56 y.o.   MRN: [id number]         Chief Complaint:: wellness exam and f/u smoking, low vitd, corn to right heel with foot callouses, and large varicose veins to distal LLE       HPI:  Cory [name] is a 56 y.o. male here for wellness exam; declines covid booster, shingrix, colonoscopy, o/w up to date                        Also still smoking, not ready to quit. Has been taking Vit d, asks for f/u lab.  Has a pain full hard stop at the right heel, worse pain and soreness over the last 2 months.  Also with worsening callous tender to lateral aspects of the right fifth MTP and 2 areas of the medial great toe.  Also has worsening distal large nontender nonpainful varicosites with slight swelling overall to the ankle and foot only.  Better in the AM after leg elevation overnight.  Pt denies chest pain, increased sob or doe, wheezing, orthopnea, PND, increased LE swelling, palpitations, dizziness or syncope.   Pt denies polydipsia, polyuria, or new focal neuro s/s.   Pt denies fever, wt loss, night sweats, loss of appetite, or other constitutional symptoms  Wt Readings from Last 3 Encounters:  11/27/21 161 lb 9.6 oz (73.3 kg)  06/25/21 156 lb (70.8 kg)  08/22/20 160 lb (72.6 kg)   BP Readings from Last 3 Encounters:  11/27/21 118/70  06/25/21 (!) 144/70  08/22/20 122/74   Immunization History  Administered Date(s) Administered   Influenza Whole 07/17/2009, 07/30/2010   Influenza,inj,Quad PF,6+ Mos 07/09/2013, 12/12/2015   Influenza-Unspecified 09/04/2017, 09/03/2021   Janssen (J&J) SARS-COV-2 Vaccination 02/11/2020   Pneumococcal Polysaccharide-23 07/17/2009   Td 01/24/2003   Tdap 12/12/2015  There are no preventive care reminders to display for this patient.    Past Medical History:  Diagnosis Date   Carpal tunnel syndrome of right wrist    CHF (congestive heart failure) ([place])    Cholelithiasis    Colon polyps 09/09/2013    hyperplastic   Condyloma    Diverticulosis 09/09/2013   ED (erectile dysfunction)    GERD (gastroesophageal reflux disease)    Hyperglycemia 04/14/2011   Hyperlipidemia    Hypertension    Hypothyroidism    Mild stage glaucoma(365.71)    Dr Ricki Miller   Tobacco abuse    Past Surgical History:  Procedure Laterality Date   COLONOSCOPY W/ BIOPSIES AND POLYPECTOMY  09/09/2013   hyperplastic polyps    reports that he has been smoking cigarettes. He has a 28.50 pack-year smoking history. He has never used smokeless tobacco. He reports current alcohol use of about 6.0 standard drinks per week. He reports current drug use. Drug: Marijuana. family history includes Alcohol abuse in his brother; Blindness in his father; Breast cancer in his mother; Diabetes in his brother and father; Glaucoma in his father; Heart disease in his father; Hypertension in his father; Seizures in his brother; Stroke in his father. Allergies  Allergen Reactions   Ace Inhibitors Swelling    Lip swelling    Current Outpatient Medications on File Prior to Visit  Medication Sig Dispense Refill   amLODipine (NORVASC) 5 MG tablet Take 1 tablet (5 mg total) by mouth daily. 90 tablet 3   amLODipine (NORVASC) 5 MG tablet Take 1 tablet (5 mg total) by mouth daily. 90 tablet  2   Cholecalciferol 50 MCG (2000 UT) TABS Take 1 tablet by mouth daily. 90 tablet 3   dorzolamide (TRUSOPT) 2 % ophthalmic solution      dorzolamide (TRUSOPT) 2 % ophthalmic solution Instill 1 drop into the affected eye(s) twice daily. 10 mL 2   dorzolamide-timolol (COSOPT) 22.3-6.8 MG/ML ophthalmic solution Place 1 drop into both eyes daily. 10 mL 2   latanoprost (XALATAN) 0.005 % ophthalmic solution Place 1 drop into both eyes 2 (two) times daily. 2.5 mL 2   latanoprost (XALATAN) 0.005 % ophthalmic solution Instill 1 drop to the affected eye(s) every night at bedtime. 2.5 mL 3   levothyroxine (SYNTHROID) 125 MCG tablet Take 1 tablet (125 mcg total) by  mouth daily. 90 tablet 3   levothyroxine (SYNTHROID) 125 MCG tablet Take 1 tablet (125 mcg total) by mouth daily. 90 tablet 3   pantoprazole (PROTONIX) 40 MG tablet Take 1 tablet by mouth 2 times daily. 60 tablet 0   pravastatin (PRAVACHOL) 40 MG tablet Take 1 tablet (40 mg total) by mouth daily. 90 tablet 3   pravastatin (PRAVACHOL) 40 MG tablet Take 1 tablet by mouth daily. 90 tablet 2   No current facility-administered medications on file prior to visit.        ROS:  All others reviewed and negative.  Objective        PE:  BP 118/70 (BP Location: Right Arm, Patient Position: Sitting, Cuff Size: Normal)    Pulse 65    Ht 5\' 6"  (1.676 m)    Wt 161 lb 9.6 oz (73.3 kg)    SpO2 99%    BMI 26.08 kg/m                 Constitutional: Pt appears in NAD               HENT: Head: NCAT.                Right Ear: External ear normal.                 Left Ear: External ear normal.                Eyes: . Pupils are equal, round, and reactive to light. Conjunctivae and EOM are normal               Nose: without d/c or deformity               Neck: Neck supple. Gross normal ROM               Cardiovascular: Normal rate and regular rhythm.                 Pulmonary/Chest: Effort normal and breath sounds without rales or wheezing.                Abd:  Soft, NT, ND, + BS, no organomegaly               Neurological: Pt is alert. At baseline orientation, motor grossly intact               Skin: LE edema - trace left pedal, distal LLE with several large varicosities nontender without phlebitis, right foot with tender corn to the right heel, also callouses noted to lateral aspect right 5th mtp, and medial great toe as well               Psychiatric: Pt behavior is normal without  agitation   Micro: none  Cardiac tracings I have personally interpreted today:  none  Pertinent Radiological findings (summarize): none   Lab Results  Component Value Date   WBC 5.9 06/25/2021   HGB 14.5 06/25/2021   HCT  43.8 06/25/2021   PLT 221.0 06/25/2021   GLUCOSE 85 06/25/2021   CHOL 200 06/25/2021   TRIG 85.0 06/25/2021   HDL 72.50 06/25/2021   LDLDIRECT 155.6 02/15/2008   LDLCALC 111 (H) 06/25/2021   ALT 18 06/25/2021   AST 20 06/25/2021   NA 139 06/25/2021   K 3.8 06/25/2021   CL 104 06/25/2021   CREATININE 1.35 06/25/2021   BUN 14 06/25/2021   CO2 26 06/25/2021   TSH 4.66 06/25/2021   PSA 2.42 06/25/2021   INR 1.2 (H) 04/13/2020   HGBA1C 5.9 01/27/2019   Assessment/Plan:  Cory [name] is a 56 y.o. Black or African American [2] male with  has a past medical history of Carpal tunnel syndrome of right wrist, CHF (congestive heart failure) ([place]), Cholelithiasis, Colon polyps (09/09/2013), Condyloma, Diverticulosis (09/09/2013), ED (erectile dysfunction), GERD (gastroesophageal reflux disease), Hyperglycemia (04/14/2011), Hyperlipidemia, Hypertension, Hypothyroidism, Mild stage glaucoma(365.71), and Tobacco abuse.  Vitamin D deficiency Last vitamin D Lab Results  Component Value Date   VD25OH 18.61 (L) 06/25/2021   Low, to start oral replacement   Encounter for well adult exam with abnormal findings Age and sex appropriate education and counseling updated with regular exercise and diet Referrals for preventative services - none needed Immunizations addressed - decilnes covid booster, shingirx Smoking counseling  - counseled to quit pt not ready Evidence for depression or other mood disorder - none significant Most recent labs reviewed. I have personally reviewed and have noted: 1) the patient's medical and social history 2) The patient's current medications and supplements 3) The patient's height, weight, and BMI have been recorded in the chart   Corn or callus Worsening recently, for refer podiatry  Hyperglycemia Lab Results  Component Value Date   HGBA1C 5.9 01/27/2019   Stable, pt to continue current medical treatment  - diet   Hyperlipidemia Lab Results   Component Value Date   LDLCALC 111 (H) 06/25/2021   Uncontrolled,  pt to continue current pravachol 40 as declines change for now   TOBACCO ABUSE Pt counsled to quit, pt not ready Followup: Return in about 1 year (around 11/27/2022).  Cathlean Cower, MD 11/27/2021 8:07 PM [facility]

## 2021-11-27 NOTE — Assessment & Plan Note (Signed)
Worsening recently, for refer podiatry

## 2021-11-27 NOTE — Patient Instructions (Signed)
Ok to CANCEL the Feb appt with me  You will be contacted regarding the referral for: podiatry (foot doctor)  Please quit smoking  Please continue all other medications as before, and refills have been done if requested.  Please have the pharmacy call with any other refills you may need.  Please continue your efforts at being more active, low cholesterol diet, and weight control.  You are otherwise up to date with prevention measures today.  Please keep your appointments with your specialists as you may have planned  Please go to the LAB at the blood drawing area for the tests to be done  You will be contacted by phone if any changes need to be made immediately.  Otherwise, you will receive a letter about your results with an explanation, but please check with MyChart first.  Please remember to sign up for MyChart if you have not done so, as this will be important to you in the future with finding out test results, communicating by private email, and scheduling acute appointments online when needed.  Please make an Appointment to return for your 1 year visit, or sooner if needed, with Lab testing by Appointment as well, to be done about 3-5 days before at the [facility] (so this is for TWO appointments - please see the scheduling desk as you leave)   Due to the ongoing Covid 19 pandemic, our lab now requires an appointment for any labs done at our office.  If you need labs done and do not have an appointment, please call our office ahead of time to schedule before presenting to the lab for your testing.

## 2021-11-27 NOTE — Assessment & Plan Note (Signed)
Age and sex appropriate education and counseling updated with regular exercise and diet Referrals for preventative services - none needed Immunizations addressed - decilnes covid booster, shingirx Smoking counseling  - counseled to quit pt not ready Evidence for depression or other mood disorder - none significant Most recent labs reviewed. I have personally reviewed and have noted: 1) the patient's medical and social history 2) The patient's current medications and supplements 3) The patient's height, weight, and BMI have been recorded in the chart

## 2021-11-27 NOTE — Assessment & Plan Note (Signed)
Last vitamin D Lab Results  Component Value Date   VD25OH 18.61 (L) 06/25/2021   Low, to start oral replacement

## 2021-11-28 ENCOUNTER — Other Ambulatory Visit: Payer: Self-pay | Admitting: Internal Medicine

## 2021-11-28 LAB — CBC WITH DIFFERENTIAL/PLATELET
Basophils Absolute: 0 10*3/uL (ref 0.0–0.1)
Basophils Relative: 0.5 % (ref 0.0–3.0)
Eosinophils Absolute: 0.1 10*3/uL (ref 0.0–0.7)
Eosinophils Relative: 1.5 % (ref 0.0–5.0)
HCT: 42 % (ref 39.0–52.0)
Hemoglobin: 13.9 g/dL (ref 13.0–17.0)
Lymphocytes Relative: 23.5 % (ref 12.0–46.0)
Lymphs Abs: 1.9 10*3/uL (ref 0.7–4.0)
MCHC: 33.2 g/dL (ref 30.0–36.0)
MCV: 89.1 fl (ref 78.0–100.0)
Monocytes Absolute: 0.7 10*3/uL (ref 0.1–1.0)
Monocytes Relative: 9 % (ref 3.0–12.0)
Neutro Abs: 5.2 10*3/uL (ref 1.4–7.7)
Neutrophils Relative %: 65.5 % (ref 43.0–77.0)
Platelets: 230 10*3/uL (ref 150.0–400.0)
RBC: 4.71 Mil/uL (ref 4.22–5.81)
RDW: 13.4 % (ref 11.5–15.5)
WBC: 7.9 10*3/uL (ref 4.0–10.5)

## 2021-11-28 LAB — HEPATIC FUNCTION PANEL
ALT: 21 U/L (ref 0–53)
AST: 22 U/L (ref 0–37)
Albumin: 4.3 g/dL (ref 3.5–5.2)
Alkaline Phosphatase: 56 U/L (ref 39–117)
Bilirubin, Direct: 0.1 mg/dL (ref 0.0–0.3)
Total Bilirubin: 0.5 mg/dL (ref 0.2–1.2)
Total Protein: 7.8 g/dL (ref 6.0–8.3)

## 2021-11-28 LAB — LIPID PANEL
Cholesterol: 206 mg/dL — ABNORMAL HIGH (ref 0–200)
HDL: 71.7 mg/dL (ref >39.00)
LDL Cholesterol: 107 mg/dL — ABNORMAL HIGH (ref 0–99)
NonHDL: 134.26
Total CHOL/HDL Ratio: 3
Triglycerides: 134 mg/dL (ref 0.0–149.0)
VLDL: 26.8 mg/dL (ref 0.0–40.0)

## 2021-11-28 LAB — URINALYSIS, ROUTINE W REFLEX MICROSCOPIC
Bilirubin Urine: NEGATIVE
Hgb urine dipstick: NEGATIVE
Ketones, ur: NEGATIVE
Leukocytes,Ua: NEGATIVE
Nitrite: NEGATIVE
RBC / HPF: NONE SEEN (ref 0–?)
Specific Gravity, Urine: >=1.03 — AB (ref 1.000–1.030)
Total Protein, Urine: NEGATIVE
Urine Glucose: NEGATIVE
Urobilinogen, UA: 0.2 (ref 0.0–1.0)
WBC, UA: NONE SEEN (ref 0–?)
pH: 6 (ref 5.0–8.0)

## 2021-11-28 LAB — BASIC METABOLIC PANEL
BUN: 14 mg/dL (ref 6–23)
CO2: 29 mEq/L (ref 19–32)
Calcium: 9.6 mg/dL (ref 8.4–10.5)
Chloride: 102 mEq/L (ref 96–112)
Creatinine, Ser: 1.24 mg/dL (ref 0.40–1.50)
GFR: 64.42 mL/min (ref >60.00)
Glucose, Bld: 93 mg/dL (ref 70–99)
Potassium: 4.1 mEq/L (ref 3.5–5.1)
Sodium: 138 mEq/L (ref 135–145)

## 2021-11-28 LAB — VITAMIN D 25 HYDROXY (VIT D DEFICIENCY, FRACTURES): VITD: 33.21 ng/mL (ref 30.00–100.00)

## 2021-11-28 LAB — VITAMIN B12: Vitamin B-12: 316 pg/mL (ref 211–911)

## 2021-11-28 LAB — TSH: TSH: 1.47 u[IU]/mL (ref 0.35–5.50)

## 2021-11-28 LAB — PSA: PSA: 3.02 ng/mL (ref 0.10–4.00)

## 2021-11-28 LAB — HEMOGLOBIN A1C: Hgb A1c MFr Bld: 5.7 % (ref 4.6–6.5)

## 2021-11-28 MED ORDER — ATORVASTATIN CALCIUM 40 MG PO TABS: 40.0000 mg | ORAL_TABLET | Freq: Every day | ORAL | 3 refills | Status: DC

## 2021-11-29 ENCOUNTER — Telehealth: Payer: Self-pay | Admitting: Internal Medicine

## 2021-11-29 NOTE — Telephone Encounter (Signed)
Pt checking status of 11-27-2021 lab results  Informed pt of providers 11-28-2021 lab result notes and recommendations  Pt expressed understanding and agreed, pt has no further questions at this time  Patient requesting  atorvastatin (LIPITOR) 40 MG tablet sent to  [facility]

## 2021-11-30 ENCOUNTER — Other Ambulatory Visit (HOSPITAL_COMMUNITY): Payer: Self-pay

## 2021-11-30 MED ORDER — ATORVASTATIN CALCIUM 40 MG PO TABS
40.0000 mg | ORAL_TABLET | Freq: Every day | ORAL | 3 refills | Status: DC
  Filled 2021-11-30 – 2022-01-04 (×2): qty 90, 90d supply, fill #0
  Filled 2022-04-03: qty 90, 90d supply, fill #1
  Filled 2022-07-01: qty 90, 90d supply, fill #2
  Filled 2022-09-27: qty 90, 90d supply, fill #3

## 2021-11-30 NOTE — Telephone Encounter (Signed)
Prescription has been sent to pharmacy.

## 2021-12-04 ENCOUNTER — Other Ambulatory Visit (HOSPITAL_COMMUNITY): Payer: Self-pay

## 2021-12-06 ENCOUNTER — Ambulatory Visit: Payer: 59 | Admitting: Podiatry

## 2021-12-06 ENCOUNTER — Other Ambulatory Visit: Payer: Self-pay

## 2021-12-06 ENCOUNTER — Encounter: Payer: Self-pay | Admitting: Podiatry

## 2021-12-06 ENCOUNTER — Other Ambulatory Visit (HOSPITAL_COMMUNITY): Payer: Self-pay

## 2021-12-06 DIAGNOSIS — L84 Corns and callosities: Secondary | ICD-10-CM | POA: Diagnosis not present

## 2021-12-06 MED ORDER — LATANOPROST 0.005 % OP SOLN
1.0000 [drp] | Freq: Every day | OPHTHALMIC | 3 refills | Status: AC
  Filled 2021-12-06: qty 2.5, 25d supply, fill #0
  Filled 2022-02-04: qty 2.5, 25d supply, fill #1
  Filled 2022-03-21: qty 2.5, 25d supply, fill #2
  Filled 2022-05-13: qty 2.5, 25d supply, fill #3

## 2021-12-06 MED ORDER — DORZOLAMIDE HCL 2 % OP SOLN
1.0000 [drp] | Freq: Two times a day (BID) | OPHTHALMIC | 3 refills | Status: AC
  Filled 2021-12-06: qty 10, 50d supply, fill #0
  Filled 2022-02-12: qty 10, 50d supply, fill #1
  Filled 2022-11-20: qty 10, 50d supply, fill #2

## 2021-12-06 NOTE — Progress Notes (Signed)
°  Subjective:  Patient ID: Cory [name], male    DOB: [birth year],   MRN: [id number]  Chief Complaint  Patient presents with   Callouses     (New Patient) Diagnosis: L84 (ICD-10-CM) - Corn or callus Referring Provider: Biagio Borg     56 y.o. male presents for concern of right foot calluses. Relates he has tried trimming them without much relief. Hoping to have them trimmed today . Denies any other pedal complaints. Denies n/v/f/c.   Past Medical History:  Diagnosis Date   Carpal tunnel syndrome of right wrist    CHF (congestive heart failure) ([place])    Cholelithiasis    Colon polyps 09/09/2013   hyperplastic   Condyloma    Diverticulosis 09/09/2013   ED (erectile dysfunction)    GERD (gastroesophageal reflux disease)    Hyperglycemia 04/14/2011   Hyperlipidemia    Hypertension    Hypothyroidism    Mild stage glaucoma(365.71)    Dr Ricki Miller   Tobacco abuse     Objective:  Physical Exam: Vascular: DP/PT pulses 2/4 bilateral. CFT <3 seconds. Normal hair growth on digits. No edema.  Skin. No lacerations or abrasions bilateral feet. Hyperkeratotic tissue noted to medial hallux on right foot as well as right heel and plantar fifth metatarsal.  Musculoskeletal: MMT 5/5 bilateral lower extremities in DF, PF, Inversion and Eversion. Deceased ROM in DF of ankle joint.  Neurological: Sensation intact to light touch.   Assessment:   1. Corns and callosities      Plan:  Patient was evaluated and treated and all questions answered. -Discussed corns and calluses with patient and treatment options.  -Hyperkeratotic tissue was debrided with chisel without incident.  -Encouraged daily moisturizing -Discussed use of pumice stone -Advised good supportive shoes and inserts -Patient to return to office as needed or sooner if condition worsens.   Lorenda Peck, DPM

## 2021-12-07 ENCOUNTER — Other Ambulatory Visit (HOSPITAL_COMMUNITY): Payer: Self-pay

## 2021-12-19 ENCOUNTER — Ambulatory Visit: Payer: Managed Care, Other (non HMO) | Admitting: Internal Medicine

## 2021-12-24 ENCOUNTER — Ambulatory Visit: Payer: Managed Care, Other (non HMO) | Admitting: Internal Medicine

## 2022-01-04 ENCOUNTER — Telehealth: Payer: Self-pay

## 2022-01-04 ENCOUNTER — Other Ambulatory Visit (HOSPITAL_COMMUNITY): Payer: Self-pay

## 2022-01-04 NOTE — Telephone Encounter (Signed)
Called patient and left voice message for patient to call office to get clarification on what medication he is needing to be sent to his pharmacy  ?

## 2022-01-04 NOTE — Telephone Encounter (Signed)
Notified patient that medication was sent to right pharmacy .  ?

## 2022-01-04 NOTE — Telephone Encounter (Signed)
Pt is requesting a CB. Pt states the new cholesterol med was called into the wrong pharmacy. I asked the pt for the name of the medication and he said he didn't know what it was; we ordered it so it should be in our system. I explained that it was multiple medications on the list and without knowing the name I cant ask that he be rerouted. Pt preceded to say can you just have the Nurse to call me back please. ? ?Pt CB [phone] ?

## 2022-01-31 ENCOUNTER — Other Ambulatory Visit (HOSPITAL_COMMUNITY): Payer: Self-pay

## 2022-02-04 ENCOUNTER — Other Ambulatory Visit (HOSPITAL_COMMUNITY): Payer: Self-pay

## 2022-02-12 ENCOUNTER — Other Ambulatory Visit (HOSPITAL_COMMUNITY): Payer: Self-pay

## 2022-03-06 ENCOUNTER — Other Ambulatory Visit (HOSPITAL_COMMUNITY): Payer: Self-pay

## 2022-03-21 ENCOUNTER — Other Ambulatory Visit (HOSPITAL_COMMUNITY): Payer: Self-pay

## 2022-04-03 ENCOUNTER — Other Ambulatory Visit (HOSPITAL_COMMUNITY): Payer: Self-pay

## 2022-04-12 ENCOUNTER — Other Ambulatory Visit (HOSPITAL_COMMUNITY): Payer: Self-pay

## 2022-04-12 MED ORDER — LATANOPROST 0.005 % OP SOLN
1.0000 [drp] | Freq: Every day | OPHTHALMIC | 3 refills | Status: DC
  Filled 2022-04-12: qty 2.5, 20d supply, fill #0
  Filled 2022-06-14: qty 2.5, 25d supply, fill #0
  Filled 2022-07-18: qty 2.5, 25d supply, fill #1
  Filled 2022-09-10: qty 2.5, 25d supply, fill #2
  Filled 2022-10-09: qty 2.5, 25d supply, fill #3

## 2022-04-12 MED ORDER — DORZOLAMIDE HCL 2 % OP SOLN
1.0000 [drp] | Freq: Two times a day (BID) | OPHTHALMIC | 3 refills | Status: DC
  Filled 2022-04-12: qty 10, 40d supply, fill #0
  Filled 2022-05-01: qty 10, 50d supply, fill #0
  Filled 2022-06-18: qty 10, 50d supply, fill #1
  Filled 2022-08-13: qty 10, 50d supply, fill #2
  Filled 2022-10-09: qty 10, 50d supply, fill #3

## 2022-04-22 ENCOUNTER — Other Ambulatory Visit (HOSPITAL_COMMUNITY): Payer: Self-pay

## 2022-05-01 ENCOUNTER — Other Ambulatory Visit (HOSPITAL_COMMUNITY): Payer: Self-pay

## 2022-05-13 ENCOUNTER — Other Ambulatory Visit (HOSPITAL_COMMUNITY): Payer: Self-pay

## 2022-06-05 ENCOUNTER — Other Ambulatory Visit: Payer: Self-pay | Admitting: Internal Medicine

## 2022-06-05 ENCOUNTER — Other Ambulatory Visit (HOSPITAL_COMMUNITY): Payer: Self-pay

## 2022-06-05 MED ORDER — LEVOTHYROXINE SODIUM 125 MCG PO TABS
125.0000 ug | ORAL_TABLET | Freq: Every day | ORAL | 1 refills | Status: DC
  Filled 2022-06-05: qty 90, 90d supply, fill #0
  Filled 2022-08-14: qty 90, 90d supply, fill #1

## 2022-06-05 NOTE — Telephone Encounter (Signed)
Please refill as per office routine med refill policy (all routine meds to be refilled for 3 mo or monthly (per pt preference) up to one year from last visit, then month to month grace period for 3 mo, then further med refills will have to be denied) ? ?

## 2022-06-14 ENCOUNTER — Other Ambulatory Visit (HOSPITAL_COMMUNITY): Payer: Self-pay

## 2022-06-18 ENCOUNTER — Other Ambulatory Visit (HOSPITAL_COMMUNITY): Payer: Self-pay

## 2022-07-02 ENCOUNTER — Other Ambulatory Visit (HOSPITAL_COMMUNITY): Payer: Self-pay

## 2022-07-18 ENCOUNTER — Other Ambulatory Visit: Payer: Self-pay | Admitting: Internal Medicine

## 2022-07-18 ENCOUNTER — Other Ambulatory Visit (HOSPITAL_COMMUNITY): Payer: Self-pay

## 2022-07-19 NOTE — Telephone Encounter (Signed)
Please refill as per office routine med refill policy (all routine meds to be refilled for 3 mo or monthly (per pt preference) up to one year from last visit, then month to month grace period for 3 mo, then further med refills will have to be denied) ? ?

## 2022-07-20 ENCOUNTER — Other Ambulatory Visit (HOSPITAL_COMMUNITY): Payer: Self-pay

## 2022-07-22 ENCOUNTER — Other Ambulatory Visit (HOSPITAL_COMMUNITY): Payer: Self-pay

## 2022-07-22 MED ORDER — AMLODIPINE BESYLATE 5 MG PO TABS
5.0000 mg | ORAL_TABLET | Freq: Every day | ORAL | 2 refills | Status: DC
  Filled 2022-07-22: qty 90, 90d supply, fill #0
  Filled 2022-10-24: qty 90, 90d supply, fill #1

## 2022-07-27 ENCOUNTER — Other Ambulatory Visit (HOSPITAL_COMMUNITY): Payer: Self-pay

## 2022-07-30 ENCOUNTER — Other Ambulatory Visit (HOSPITAL_COMMUNITY): Payer: Self-pay

## 2022-08-07 ENCOUNTER — Other Ambulatory Visit (HOSPITAL_COMMUNITY): Payer: Self-pay

## 2022-08-13 ENCOUNTER — Other Ambulatory Visit (HOSPITAL_COMMUNITY): Payer: Self-pay

## 2022-08-14 ENCOUNTER — Other Ambulatory Visit (HOSPITAL_COMMUNITY): Payer: Self-pay

## 2022-08-16 ENCOUNTER — Other Ambulatory Visit (HOSPITAL_COMMUNITY): Payer: Self-pay

## 2022-08-21 ENCOUNTER — Other Ambulatory Visit (HOSPITAL_COMMUNITY): Payer: Self-pay

## 2022-09-10 ENCOUNTER — Other Ambulatory Visit (HOSPITAL_COMMUNITY): Payer: Self-pay

## 2022-09-27 ENCOUNTER — Other Ambulatory Visit (HOSPITAL_COMMUNITY): Payer: Self-pay

## 2022-10-09 ENCOUNTER — Other Ambulatory Visit (HOSPITAL_COMMUNITY): Payer: Self-pay

## 2022-10-24 ENCOUNTER — Other Ambulatory Visit (HOSPITAL_COMMUNITY): Payer: Self-pay

## 2022-11-01 ENCOUNTER — Other Ambulatory Visit (HOSPITAL_COMMUNITY): Payer: Self-pay

## 2022-11-07 ENCOUNTER — Ambulatory Visit: Payer: Commercial Managed Care - PPO | Admitting: Internal Medicine

## 2022-11-20 ENCOUNTER — Other Ambulatory Visit: Payer: Self-pay | Admitting: Internal Medicine

## 2022-11-20 ENCOUNTER — Other Ambulatory Visit (HOSPITAL_COMMUNITY): Payer: Self-pay

## 2022-11-20 MED ORDER — LEVOTHYROXINE SODIUM 125 MCG PO TABS
125.0000 ug | ORAL_TABLET | Freq: Every day | ORAL | 0 refills | Status: DC
  Filled 2022-11-20: qty 30, 30d supply, fill #0

## 2022-11-20 MED ORDER — LATANOPROST 0.005 % OP SOLN
1.0000 [drp] | Freq: Every day | OPHTHALMIC | 0 refills | Status: DC
  Filled 2022-11-20: qty 2.5, 25d supply, fill #0

## 2022-11-20 NOTE — Telephone Encounter (Signed)
Please refill as per office routine med refill policy (all routine meds to be refilled for 3 mo or monthly (per pt preference) up to one year from last visit, then month to month grace period for 3 mo, then further med refills will have to be denied)

## 2022-11-22 ENCOUNTER — Other Ambulatory Visit (HOSPITAL_COMMUNITY): Payer: Self-pay

## 2022-11-23 ENCOUNTER — Other Ambulatory Visit (HOSPITAL_COMMUNITY): Payer: Self-pay

## 2022-11-27 ENCOUNTER — Other Ambulatory Visit (HOSPITAL_COMMUNITY): Payer: Self-pay

## 2022-11-28 ENCOUNTER — Ambulatory Visit (INDEPENDENT_AMBULATORY_CARE_PROVIDER_SITE_OTHER): Payer: 59 | Admitting: Internal Medicine

## 2022-11-28 ENCOUNTER — Other Ambulatory Visit (HOSPITAL_COMMUNITY): Payer: Self-pay

## 2022-11-28 ENCOUNTER — Other Ambulatory Visit: Payer: Self-pay | Admitting: Internal Medicine

## 2022-11-28 VITALS — BP 130/76 | HR 70 | Temp 98.2°F | Ht 66.0 in | Wt 162.0 lb

## 2022-11-28 DIAGNOSIS — E782 Mixed hyperlipidemia: Secondary | ICD-10-CM | POA: Diagnosis not present

## 2022-11-28 DIAGNOSIS — L84 Corns and callosities: Secondary | ICD-10-CM | POA: Diagnosis not present

## 2022-11-28 DIAGNOSIS — F17200 Nicotine dependence, unspecified, uncomplicated: Secondary | ICD-10-CM

## 2022-11-28 DIAGNOSIS — R739 Hyperglycemia, unspecified: Secondary | ICD-10-CM | POA: Diagnosis not present

## 2022-11-28 DIAGNOSIS — Z125 Encounter for screening for malignant neoplasm of prostate: Secondary | ICD-10-CM

## 2022-11-28 DIAGNOSIS — M7711 Lateral epicondylitis, right elbow: Secondary | ICD-10-CM | POA: Diagnosis not present

## 2022-11-28 DIAGNOSIS — E559 Vitamin D deficiency, unspecified: Secondary | ICD-10-CM

## 2022-11-28 DIAGNOSIS — Z23 Encounter for immunization: Secondary | ICD-10-CM

## 2022-11-28 DIAGNOSIS — Z0001 Encounter for general adult medical examination with abnormal findings: Secondary | ICD-10-CM | POA: Diagnosis not present

## 2022-11-28 DIAGNOSIS — E538 Deficiency of other specified B group vitamins: Secondary | ICD-10-CM

## 2022-11-28 DIAGNOSIS — H409 Unspecified glaucoma: Secondary | ICD-10-CM

## 2022-11-28 LAB — PSA: PSA: 3.14 ng/mL (ref 0.10–4.00)

## 2022-11-28 LAB — CBC WITH DIFFERENTIAL/PLATELET
Basophils Absolute: 0.1 10*3/uL (ref 0.0–0.1)
Basophils Relative: 1 % (ref 0.0–3.0)
Eosinophils Absolute: 0.2 10*3/uL (ref 0.0–0.7)
Eosinophils Relative: 2.4 % (ref 0.0–5.0)
HCT: 41.1 % (ref 39.0–52.0)
Hemoglobin: 13.8 g/dL (ref 13.0–17.0)
Lymphocytes Relative: 26.4 % (ref 12.0–46.0)
Lymphs Abs: 1.9 10*3/uL (ref 0.7–4.0)
MCHC: 33.6 g/dL (ref 30.0–36.0)
MCV: 88.6 fl (ref 78.0–100.0)
Monocytes Absolute: 0.6 10*3/uL (ref 0.1–1.0)
Monocytes Relative: 8 % (ref 3.0–12.0)
Neutro Abs: 4.6 10*3/uL (ref 1.4–7.7)
Neutrophils Relative %: 62.2 % (ref 43.0–77.0)
Platelets: 238 10*3/uL (ref 150.0–400.0)
RBC: 4.64 Mil/uL (ref 4.22–5.81)
RDW: 13.9 % (ref 11.5–15.5)
WBC: 7.4 10*3/uL (ref 4.0–10.5)

## 2022-11-28 LAB — URINALYSIS, ROUTINE W REFLEX MICROSCOPIC
Bilirubin Urine: NEGATIVE
Ketones, ur: NEGATIVE
Leukocytes,Ua: NEGATIVE
Nitrite: NEGATIVE
Specific Gravity, Urine: >=1.03 — AB (ref 1.000–1.030)
Total Protein, Urine: NEGATIVE
Urine Glucose: NEGATIVE
Urobilinogen, UA: 0.2 (ref 0.0–1.0)
pH: 6 (ref 5.0–8.0)

## 2022-11-28 LAB — LIPID PANEL
Cholesterol: 193 mg/dL (ref 0–200)
HDL: 63.8 mg/dL (ref >39.00)
LDL Cholesterol: 109 mg/dL — ABNORMAL HIGH (ref 0–99)
NonHDL: 129.51
Total CHOL/HDL Ratio: 3
Triglycerides: 103 mg/dL (ref 0.0–149.0)
VLDL: 20.6 mg/dL (ref 0.0–40.0)

## 2022-11-28 LAB — BASIC METABOLIC PANEL
BUN: 15 mg/dL (ref 6–23)
CO2: 28 mEq/L (ref 19–32)
Calcium: 9.3 mg/dL (ref 8.4–10.5)
Chloride: 104 mEq/L (ref 96–112)
Creatinine, Ser: 1.08 mg/dL (ref 0.40–1.50)
GFR: 75.5 mL/min (ref >60.00)
Glucose, Bld: 88 mg/dL (ref 70–99)
Potassium: 4 mEq/L (ref 3.5–5.1)
Sodium: 139 mEq/L (ref 135–145)

## 2022-11-28 LAB — HEPATIC FUNCTION PANEL
ALT: 26 U/L (ref 0–53)
AST: 25 U/L (ref 0–37)
Albumin: 4.3 g/dL (ref 3.5–5.2)
Alkaline Phosphatase: 62 U/L (ref 39–117)
Bilirubin, Direct: 0.1 mg/dL (ref 0.0–0.3)
Total Bilirubin: 0.3 mg/dL (ref 0.2–1.2)
Total Protein: 7.5 g/dL (ref 6.0–8.3)

## 2022-11-28 LAB — TSH: TSH: 1.15 u[IU]/mL (ref 0.35–5.50)

## 2022-11-28 LAB — HEMOGLOBIN A1C: Hgb A1c MFr Bld: 5.9 % (ref 4.6–6.5)

## 2022-11-28 MED ORDER — DORZOLAMIDE HCL 2 % OP SOLN
1.0000 [drp] | Freq: Two times a day (BID) | OPHTHALMIC | 3 refills | Status: DC
  Filled 2022-11-28: qty 10, 50d supply, fill #0
  Filled 2022-12-31: qty 10, 40d supply, fill #0
  Filled 2023-02-26 – 2023-03-06 (×2): qty 10, 40d supply, fill #1
  Filled 2023-04-16: qty 10, 40d supply, fill #2
  Filled 2023-05-27: qty 10, 40d supply, fill #3

## 2022-11-28 MED ORDER — LATANOPROST 0.005 % OP SOLN
1.0000 [drp] | Freq: Every day | OPHTHALMIC | 2 refills | Status: DC
  Filled 2022-12-16: qty 2.5, 25d supply, fill #0
  Filled 2023-01-14: qty 2.5, 25d supply, fill #1
  Filled 2023-02-07: qty 2.5, 25d supply, fill #2

## 2022-11-28 NOTE — Progress Notes (Signed)
Patient ID: Cory [name], male   DOB: 12-03-1965, 57 y.o.   MRN: [id number]         Chief Complaint:: wellness exam and Physical (Right elbow pain, needle pain sensation in left shoulder, callus on right foot)  , glaucoma, hld , hyperglycemia, tobaccu abuse       HPI:  Cory [name] is a 57 y.o. male here for wellness exam; declines covid booster, delcines pulm referral for LDCt for now, also for shingrx at the pharmacy, for flu shot today, o/w up to date.  Still smoking, not ready to quit.                          Also c/o 2 wks onset tender sore swelling area to the right lateral elbow, without trauma but uses hands and arms daily at work quit a lot.  Also stands quite a bit, and has very painful hard spot to the right plantar heel.  Also has hx of glaucoma, optho now retired, needs bridge meds and referral to new optho.  Pt denies chest pain, increased sob or doe, wheezing, orthopnea, PND, increased LE swelling, palpitations, dizziness or syncope.   Pt denies polydipsia, polyuria, or new focal neuro s/s.    Pt denies fever, wt loss, night sweats, loss of appetite, or other constitutional symptoms     Wt Readings from Last 3 Encounters:  11/28/22 162 lb (73.5 kg)  11/27/21 161 lb 9.6 oz (73.3 kg)  06/25/21 156 lb (70.8 kg)   BP Readings from Last 3 Encounters:  11/28/22 130/76  11/27/21 118/70  06/25/21 (!) 144/70   Immunization History  Administered Date(s) Administered   Influenza Whole 07/17/2009, 07/30/2010   Influenza,inj,Quad PF,6+ Mos 07/09/2013, 12/12/2015, 11/28/2022   Influenza-Unspecified 09/04/2017, 09/03/2021   Janssen (J&J) SARS-COV-2 Vaccination 02/11/2020   Pneumococcal Polysaccharide-23 07/17/2009   Td 01/24/2003   Tdap 12/12/2015   Health Maintenance Due  Topic Date Due   COVID-19 Vaccine (2 - 2023-24 season) 07/05/2022      Past Medical History:  Diagnosis Date   Carpal tunnel syndrome of right wrist    CHF (congestive heart failure) ([place])     Cholelithiasis    Colon polyps 09/09/2013   hyperplastic   Condyloma    Diverticulosis 09/09/2013   ED (erectile dysfunction)    GERD (gastroesophageal reflux disease)    Hyperglycemia 04/14/2011   Hyperlipidemia    Hypertension    Hypothyroidism    Mild stage glaucoma(365.71)    Dr Ricki Miller   Tobacco abuse    Past Surgical History:  Procedure Laterality Date   COLONOSCOPY W/ BIOPSIES AND POLYPECTOMY  09/09/2013   hyperplastic polyps    reports that he has been smoking cigarettes. He has a 28.50 pack-year smoking history. He has never used smokeless tobacco. He reports current alcohol use of about 6.0 standard drinks of alcohol per week. He reports current drug use. Drug: Marijuana. family history includes Alcohol abuse in his brother; Blindness in his father; Breast cancer in his mother; Diabetes in his brother and father; Glaucoma in his father; Heart disease in his father; Hypertension in his father; Seizures in his brother; Stroke in his father. Allergies  Allergen Reactions   Ace Inhibitors Swelling    Lip swelling    Current Outpatient Medications on File Prior to Visit  Medication Sig Dispense Refill   Cholecalciferol 50 MCG (2000 UT) TABS Take 1 tablet by mouth daily. 90 tablet 3  dorzolamide (TRUSOPT) 2 % ophthalmic solution      dorzolamide (TRUSOPT) 2 % ophthalmic solution Instill 1 drop into the affected eye(s) twice daily. 10 mL 2   dorzolamide (TRUSOPT) 2 % ophthalmic solution Instill 1 drop into both eyes twice a day 10 mL 3   dorzolamide-timolol (COSOPT) 22.3-6.8 MG/ML ophthalmic solution Place 1 drop into both eyes daily. 10 mL 2   latanoprost (XALATAN) 0.005 % ophthalmic solution Place 1 drop into both eyes 2 (two) times daily. 2.5 mL 2   latanoprost (XALATAN) 0.005 % ophthalmic solution Instill 1 drop to the affected eye(s) every night at bedtime. 2.5 mL 3   latanoprost (XALATAN) 0.005 % ophthalmic solution Instill 1 drop into both eyes at bedtime 2.5 mL 3    No current facility-administered medications on file prior to visit.        ROS:  All others reviewed and negative.  Objective        PE:  BP 130/76 (BP Location: Left Arm, Patient Position: Sitting, Cuff Size: Large)   Pulse 70   Temp 98.2 F (36.8 C) (Oral)   Ht '5\' 6"'$  (1.676 m)   Wt 162 lb (73.5 kg)   SpO2 99%   BMI 26.15 kg/m                 Constitutional: Pt appears in NAD               HENT: Head: NCAT.                Right Ear: External ear normal.                 Left Ear: External ear normal.                Eyes: . Pupils are equal, round, and reactive to light. Conjunctivae and EOM are normal               Nose: without d/c or deformity               Neck: Neck supple. Gross normal ROM               Cardiovascular: Normal rate and regular rhythm.                 Pulmonary/Chest: Effort normal and breath sounds without rales or wheezing.                Abd:  Soft, NT, ND, + BS, no organomegaly               Neurological: Pt is alert. At baseline orientation, motor grossly intact               Skin: Skin is warm. LE edema - none               Right elbow with mild to mod right lateral epicondylar tender swelling without overlying skin change;  right plantar heel with moderate size tender corn area hard with minimal surrounding swelling               Psychiatric: Pt behavior is normal without agitation   Micro: none  Cardiac tracings I have personally interpreted today:  none  Pertinent Radiological findings (summarize): none   Lab Results  Component Value Date   WBC 7.4 11/28/2022   HGB 13.8 11/28/2022   HCT 41.1 11/28/2022   PLT 238.0 11/28/2022   GLUCOSE 88 11/28/2022   CHOL 193 11/28/2022  TRIG 103.0 11/28/2022   HDL 63.80 11/28/2022   LDLDIRECT 155.6 02/15/2008   LDLCALC 109 (H) 11/28/2022   ALT 26 11/28/2022   AST 25 11/28/2022   NA 139 11/28/2022   K 4.0 11/28/2022   CL 104 11/28/2022   CREATININE 1.08 11/28/2022   BUN 15 11/28/2022   CO2 28  11/28/2022   TSH 1.15 11/28/2022   PSA 3.14 11/28/2022   INR 1.2 (H) 04/13/2020   HGBA1C 5.9 11/28/2022   Assessment/Plan:  Cory [name] is a 57 y.o. Black or African American [2] male with  has a past medical history of Carpal tunnel syndrome of right wrist, CHF (congestive heart failure) ([place]), Cholelithiasis, Colon polyps (09/09/2013), Condyloma, Diverticulosis (09/09/2013), ED (erectile dysfunction), GERD (gastroesophageal reflux disease), Hyperglycemia (04/14/2011), Hyperlipidemia, Hypertension, Hypothyroidism, Mild stage glaucoma(365.71), and Tobacco abuse.  Encounter for well adult exam with abnormal findings Age and sex appropriate education and counseling updated with regular exercise and diet Referrals for preventative services - declines LDCT referral Immunizations addressed - for flu shot today, for shingrix at pharmacy, declines covid booster Smoking counseling  - counsleed to quit, pt not ready Evidence for depression or other mood disorder - none significant Most recent labs reviewed. I have personally reviewed and have noted: 1) the patient's medical and social history 2) The patient's current medications and supplements 3) The patient's height, weight, and BMI have been recorded in the chart   Corn or callus Recent worsening now quite painful right plantar heel - for podiatry referral  Hyperglycemia Lab Results  Component Value Date   HGBA1C 5.9 11/28/2022   Stable, pt to continue current medical treatment  - diet, wt control   Hyperlipidemia Lab Results  Component Value Date   [place] 109 (H) 11/28/2022   Uncontrolled, declines statin for now, for lower chol diet   TOBACCO ABUSE Pt counsled to quit, pt not ready  Vitamin D deficiency Last vitamin D Lab Results  Component Value Date   VD25OH 31.27 11/28/2022   Low to start oral replacement  Glaucoma Ok for restart bridge meds, but also new optho referral  Right lateral epicondylitis Mild to  mod, for otc volt gel prn, and to sport med if not improved  Followup: Return in about 1 year (around 11/29/2023).  Cathlean Cower, MD 12/01/2022 6:20 PM [facility]

## 2022-11-28 NOTE — Patient Instructions (Signed)
You had the flu shot today  Please use the OTC Voltaren gel as needed for the right elbow pain  Please continue all other medications as before, including the eye drops if you know the script  Please have the pharmacy call with any other refills you may need.  Please continue your efforts at being more active, low cholesterol diet, and weight control.  You are otherwise up to date with prevention measures today.  Please keep your appointments with your specialists as you may have planned  You will be contacted regarding the referral for: Ophthalmology (eye doctor), and Podiatry (foot doctor)  Please go to the LAB at the blood drawing area for the tests to be done  You will be contacted by phone if any changes need to be made immediately.  Otherwise, you will receive a letter about your results with an explanation, but please check with MyChart first.  Please remember to sign up for MyChart if you have not done so, as this will be important to you in the future with finding out test results, communicating by private email, and scheduling acute appointments online when needed.  Please make an Appointment to return for your 1 year visit, or sooner if needed

## 2022-11-29 ENCOUNTER — Other Ambulatory Visit (HOSPITAL_COMMUNITY): Payer: Self-pay

## 2022-11-29 ENCOUNTER — Other Ambulatory Visit: Payer: Self-pay | Admitting: Internal Medicine

## 2022-11-29 LAB — VITAMIN D 25 HYDROXY (VIT D DEFICIENCY, FRACTURES): VITD: 31.27 ng/mL (ref 30.00–100.00)

## 2022-11-29 LAB — VITAMIN B12: Vitamin B-12: 286 pg/mL (ref 211–911)

## 2022-11-29 MED ORDER — ATORVASTATIN CALCIUM 80 MG PO TABS
80.0000 mg | ORAL_TABLET | Freq: Every day | ORAL | 3 refills | Status: DC
  Filled 2022-11-29: qty 90, 90d supply, fill #0
  Filled 2023-02-26: qty 4, 4d supply, fill #1
  Filled 2023-02-26: qty 86, 86d supply, fill #1
  Filled 2023-03-06: qty 90, 90d supply, fill #1
  Filled 2023-05-27: qty 90, 90d supply, fill #2
  Filled 2023-08-28: qty 90, 90d supply, fill #3

## 2022-11-29 MED ORDER — LEVOTHYROXINE SODIUM 125 MCG PO TABS
125.0000 ug | ORAL_TABLET | Freq: Every day | ORAL | 3 refills | Status: DC
  Filled 2022-11-29 – 2022-12-31 (×2): qty 90, 90d supply, fill #0
  Filled 2023-03-13: qty 90, 90d supply, fill #1
  Filled 2023-05-27 – 2023-06-18 (×2): qty 90, 90d supply, fill #2
  Filled 2023-09-23: qty 90, 90d supply, fill #3

## 2022-11-29 MED ORDER — PANTOPRAZOLE SODIUM 40 MG PO TBEC
40.0000 mg | DELAYED_RELEASE_TABLET | Freq: Two times a day (BID) | ORAL | 3 refills | Status: DC
  Filled 2022-11-29: qty 180, 90d supply, fill #0
  Filled 2023-02-26 – 2023-03-06 (×2): qty 180, 90d supply, fill #1
  Filled 2023-05-27: qty 180, 90d supply, fill #2
  Filled 2023-08-29: qty 180, 90d supply, fill #3

## 2022-11-29 MED ORDER — AMLODIPINE BESYLATE 5 MG PO TABS
5.0000 mg | ORAL_TABLET | Freq: Every day | ORAL | 3 refills | Status: DC
  Filled 2022-11-29 – 2023-01-27 (×2): qty 90, 90d supply, fill #0
  Filled 2023-05-13: qty 90, 90d supply, fill #1
  Filled 2023-08-08: qty 90, 90d supply, fill #2
  Filled 2023-11-03: qty 90, 90d supply, fill #3

## 2022-12-01 ENCOUNTER — Encounter: Payer: Self-pay | Admitting: Internal Medicine

## 2022-12-01 DIAGNOSIS — H409 Unspecified glaucoma: Secondary | ICD-10-CM | POA: Insufficient documentation

## 2022-12-01 DIAGNOSIS — M7711 Lateral epicondylitis, right elbow: Secondary | ICD-10-CM | POA: Insufficient documentation

## 2022-12-01 NOTE — Assessment & Plan Note (Signed)
Last vitamin D Lab Results  Component Value Date   VD25OH 31.27 11/28/2022   Low to start oral replacement

## 2022-12-01 NOTE — Assessment & Plan Note (Signed)
Age and sex appropriate education and counseling updated with regular exercise and diet Referrals for preventative services - declines LDCT referral Immunizations addressed - for flu shot today, for shingrix at pharmacy, declines covid booster Smoking counseling  - counsleed to quit, pt not ready Evidence for depression or other mood disorder - none significant Most recent labs reviewed. I have personally reviewed and have noted: 1) the patient's medical and social history 2) The patient's current medications and supplements 3) The patient's height, weight, and BMI have been recorded in the chart

## 2022-12-01 NOTE — Assessment & Plan Note (Signed)
Lab Results  Component Value Date   HGBA1C 5.9 11/28/2022   Stable, pt to continue current medical treatment  - diet, wt control

## 2022-12-01 NOTE — Assessment & Plan Note (Signed)
Pt counsled to quit, pt not ready °

## 2022-12-01 NOTE — Assessment & Plan Note (Signed)
Mild to mod, for otc volt gel prn, and to sport med if not improved

## 2022-12-01 NOTE — Assessment & Plan Note (Signed)
Lab Results  Component Value Date   LDLCALC 109 (H) 11/28/2022   Uncontrolled, declines statin for now, for lower chol diet

## 2022-12-01 NOTE — Assessment & Plan Note (Signed)
Recent worsening now quite painful right plantar heel - for podiatry referral

## 2022-12-01 NOTE — Assessment & Plan Note (Signed)
[place] for restart bridge meds, but also new optho referral

## 2022-12-02 ENCOUNTER — Other Ambulatory Visit (HOSPITAL_COMMUNITY): Payer: Self-pay

## 2022-12-02 ENCOUNTER — Telehealth: Payer: Self-pay | Admitting: Internal Medicine

## 2022-12-02 NOTE — Telephone Encounter (Signed)
Patient would like a call back in reference to some new medications he was put on. Best callback number is [phone].

## 2022-12-03 NOTE — Telephone Encounter (Signed)
Left message for a call back from patient regarding medication

## 2022-12-04 NOTE — Telephone Encounter (Signed)
Patient would like to know why he has been prescribed protonix without an diagnosis and it was not discussed at last visit

## 2022-12-04 NOTE — Telephone Encounter (Signed)
This appears to have been an inadvertant refill as the protonix was still on the med list  Ok to hold off on taking

## 2022-12-05 NOTE — Telephone Encounter (Signed)
Patient informed via my chart.

## 2022-12-16 ENCOUNTER — Ambulatory Visit: Payer: 59 | Admitting: Podiatry

## 2022-12-16 ENCOUNTER — Other Ambulatory Visit (HOSPITAL_COMMUNITY): Payer: Self-pay

## 2022-12-23 ENCOUNTER — Ambulatory Visit: Payer: 59 | Admitting: Podiatry

## 2022-12-31 ENCOUNTER — Other Ambulatory Visit (HOSPITAL_COMMUNITY): Payer: Self-pay

## 2023-01-14 ENCOUNTER — Other Ambulatory Visit (HOSPITAL_COMMUNITY): Payer: Self-pay

## 2023-01-27 ENCOUNTER — Other Ambulatory Visit (HOSPITAL_COMMUNITY): Payer: Self-pay

## 2023-01-28 ENCOUNTER — Other Ambulatory Visit (HOSPITAL_COMMUNITY): Payer: Self-pay

## 2023-02-07 ENCOUNTER — Other Ambulatory Visit (HOSPITAL_COMMUNITY): Payer: Self-pay

## 2023-02-26 ENCOUNTER — Other Ambulatory Visit (HOSPITAL_COMMUNITY): Payer: Self-pay

## 2023-03-06 ENCOUNTER — Other Ambulatory Visit (HOSPITAL_COMMUNITY): Payer: Self-pay

## 2023-03-13 ENCOUNTER — Other Ambulatory Visit (HOSPITAL_COMMUNITY): Payer: Self-pay

## 2023-03-14 ENCOUNTER — Other Ambulatory Visit (HOSPITAL_COMMUNITY): Payer: Self-pay

## 2023-03-15 ENCOUNTER — Other Ambulatory Visit (HOSPITAL_COMMUNITY): Payer: Self-pay

## 2023-04-04 ENCOUNTER — Telehealth (HOSPITAL_COMMUNITY): Payer: Self-pay

## 2023-04-04 ENCOUNTER — Other Ambulatory Visit (HOSPITAL_COMMUNITY): Payer: Self-pay

## 2023-04-04 ENCOUNTER — Ambulatory Visit (HOSPITAL_COMMUNITY)
Admission: EM | Admit: 2023-04-04 | Discharge: 2023-04-04 | Disposition: A | Discharge status: Home / Self Care | Payer: 59 | Attending: Emergency Medicine | Admitting: Emergency Medicine

## 2023-04-04 ENCOUNTER — Encounter (HOSPITAL_COMMUNITY): Payer: Self-pay

## 2023-04-04 DIAGNOSIS — K2920 Alcoholic gastritis without bleeding: Secondary | ICD-10-CM

## 2023-04-04 DIAGNOSIS — K2970 Gastritis, unspecified, without bleeding: Secondary | ICD-10-CM

## 2023-04-04 DIAGNOSIS — R1013 Epigastric pain: Secondary | ICD-10-CM

## 2023-04-04 MED ORDER — ALUM & MAG HYDROXIDE-SIMETH 200-200-20 MG/5ML PO SUSP
ORAL | Status: AC
  Filled 2023-04-04: qty 30

## 2023-04-04 MED ORDER — ONDANSETRON 4 MG PO TBDP: 8.0000 mg | ORAL_TABLET | Freq: Once | ORAL | Status: DC

## 2023-04-04 MED ORDER — SUCRALFATE 1 GM/10ML PO SUSP
1.0000 g | Freq: Three times a day (TID) | ORAL | 0 refills | Status: DC
  Filled 2023-04-04: qty 420, 11d supply, fill #0

## 2023-04-04 MED ORDER — ONDANSETRON 8 MG PO TBDP
8.0000 mg | ORAL_TABLET | Freq: Three times a day (TID) | ORAL | 0 refills | Status: DC | PRN
  Filled 2023-04-04: qty 20, 7d supply, fill #0

## 2023-04-04 MED ORDER — ALUM & MAG HYDROXIDE-SIMETH 200-200-20 MG/5ML PO SUSP
30.0000 mL | Freq: Once | ORAL | Status: AC
  Administered 2023-04-04: 30 mL via ORAL

## 2023-04-04 MED ORDER — LIDOCAINE VISCOUS HCL 2 % MT SOLN
15.0000 mL | Freq: Once | OROMUCOSAL | Status: AC
  Administered 2023-04-04: 15 mL via OROMUCOSAL

## 2023-04-04 MED ORDER — SUCRALFATE 1 G PO TABS
1.0000 g | ORAL_TABLET | Freq: Three times a day (TID) | ORAL | 0 refills | Status: DC
  Filled 2023-04-04: qty 120, 30d supply, fill #0

## 2023-04-04 MED ORDER — LIDOCAINE VISCOUS HCL 2 % MT SOLN
OROMUCOSAL | Status: AC
  Filled 2023-04-04: qty 15

## 2023-04-04 MED ORDER — ONDANSETRON HCL 4 MG/2ML IJ SOLN
4.0000 mg | Freq: Once | INTRAMUSCULAR | Status: AC
  Administered 2023-04-04: 4 mg via INTRAMUSCULAR

## 2023-04-04 MED ORDER — ONDANSETRON HCL 4 MG/2ML IJ SOLN
INTRAMUSCULAR | Status: AC
  Filled 2023-04-04: qty 2

## 2023-04-04 NOTE — Discharge Instructions (Addendum)
Please decrease your alcohol intake, as this increases your risk for gastric ulcers.  You can take the Carafate with meals to help coat your stomach. Please take your Protonix daily.  If you are going to drink alcohol, it is important to have food on your stomach.  You can take the Zofran as needed for nausea.  I recommend a bland diet, starting with clear liquids and gradually progressing to broth, chicken, rice, toast, applesauce and bananas if tolerated.  Please seek immediate care if you have blood in your vomit or your stool, worsening abdominal pain fever, you have persistent vomiting despite nausea medication or if you develop any new concerning symptoms.  It is important that you follow-up with your primary care provider regarding your abdominal pain/gastritis.

## 2023-04-04 NOTE — ED Provider Notes (Signed)
[facility]    CSN: [id number] Arrival date & time: 04/04/23  1349      History   Chief Complaint Chief Complaint  Patient presents with   Abdominal Pain    HPI Cory [name] is a 57 y.o. male.   Patient presents to clinic with complaints of abdominal pain, nausea, vomiting and diarrhea since this morning.  Reports he drank a 32 ounce beer last night, but did not eat anything.  He normally drinks alcohol daily, sometimes 32 ounces, sometimes more, but the differences he normally eats.  Reports his vomit was all beer, last episode earlier this morning.  Denies any blood.  He did try to eat a biscuit and he vomited this up.  He is nauseous in clinic, sipping on water.  Nausea remains.  Diarrhea 3 times today and once in clinic, nonbloody.    The history is provided by the patient and medical records.  Abdominal Pain Associated symptoms: diarrhea, nausea and vomiting   Associated symptoms: no fever     Past Medical History:  Diagnosis Date   Carpal tunnel syndrome of right wrist    CHF (congestive heart failure) (HCC)    Cholelithiasis    Colon polyps 09/09/2013   hyperplastic   Condyloma    Diverticulosis 09/09/2013   ED (erectile dysfunction)    GERD (gastroesophageal reflux disease)    Hyperglycemia 04/14/2011   Hyperlipidemia    Hypertension    Hypothyroidism    Mild stage glaucoma(365.71)    Dr Mitzi Davenport   Tobacco abuse     Patient Active Problem List   Diagnosis Date Noted   Glaucoma 12/01/2022   Right lateral epicondylitis 12/01/2022   Corn or callus 11/27/2021   Iron deficiency 06/25/2021   Hematochezia 04/13/2020   Arthritis of right acromioclavicular joint 07/22/2019   Right shoulder pain 06/10/2019   Right knee pain 06/10/2019   Headache 01/27/2019   Vitamin D deficiency 01/11/2018   Angioedema 01/11/2018   Right ear impacted cerumen 12/30/2017   Toe pain, right 12/30/2017   Tinea cruris 04/08/2017   Numbness of fingers  04/08/2017   Nausea with vomiting 02/14/2016   Diarrhea 02/14/2016   Elbow strain 12/29/2015   Abdominal tenderness, unspecified site 12/12/2015   Gastroesophageal reflux disease with esophagitis 12/12/2015   Dysphagia 12/12/2015   Numbness and tingling in right hand 10/24/2015   Muscle cramps 10/24/2015   Rectal bleeding 07/09/2013   Encounter for well adult exam with abnormal findings 03/19/2013   Hemorrhoids, internal, with bleeding, and prolapse 08/26/2011   Hyperglycemia 04/14/2011   CARPAL TUNNEL SYNDROME, RIGHT 01/08/2011   Hypertension, uncontrolled 09/17/2010   CONDYLOMA 07/30/2010   BACK PAIN 07/23/2010   HAND PAIN, RIGHT 07/23/2010   ERECTILE DYSFUNCTION, ORGANIC 03/12/2010   HOARSENESS 05/12/2009   Hyperlipidemia 01/07/2008   TOBACCO ABUSE 01/07/2008   Hypothyroidism 07/04/2007   CHOLELITHIASIS 07/04/2007    Past Surgical History:  Procedure Laterality Date   COLONOSCOPY W/ BIOPSIES AND POLYPECTOMY  09/09/2013   hyperplastic polyps       Home Medications    Prior to Admission medications   Medication Sig Start Date End Date Taking? Authorizing Provider  ondansetron (ZOFRAN-ODT) 8 MG disintegrating tablet Take 1 tablet (8 mg total) by mouth every 8 (eight) hours as needed for nausea or vomiting. 04/04/23  Yes Rinaldo Ratel, [place] N, FNP  sucralfate (CARAFATE) 1 GM/10ML suspension Take 10 mLs (1 g total) by mouth 4 (four) times daily -  with meals and at bedtime.  04/04/23  Yes Rinaldo Ratel, [place] N, FNP  amLODipine (NORVASC) 5 MG tablet Take 1 tablet (5 mg total) by mouth daily. 11/29/22   Corwin Levins, MD  atorvastatin (LIPITOR) 80 MG tablet Take 1 tablet (80 mg total) by mouth daily. 11/29/22   Corwin Levins, MD  Cholecalciferol 50 MCG (2000 UT) TABS Take 1 tablet by mouth daily. 10/05/20   Corwin Levins, MD  dorzolamide (TRUSOPT) 2 % ophthalmic solution  12/11/15   [provider]  dorzolamide (TRUSOPT) 2 % ophthalmic solution Instill 1 drop into the affected  eye(s) twice daily. 03/30/21     dorzolamide (TRUSOPT) 2 % ophthalmic solution Instill 1 drop into both eyes twice a day 12/06/21     dorzolamide (TRUSOPT) 2 % ophthalmic solution Place 1 drop into both eyes twice a day 11/28/22   Corwin Levins, MD  dorzolamide-timolol (COSOPT) 22.3-6.8 MG/ML ophthalmic solution Place 1 drop into both eyes daily. 10/11/14   Reuben Likes, MD  latanoprost (XALATAN) 0.005 % ophthalmic solution Place 1 drop into both eyes 2 (two) times daily. 10/11/14   Reuben Likes, MD  latanoprost (XALATAN) 0.005 % ophthalmic solution Instill 1 drop to the affected eye(s) every night at bedtime. 03/30/21     latanoprost (XALATAN) 0.005 % ophthalmic solution Instill 1 drop into both eyes at bedtime 12/06/21     latanoprost (XALATAN) 0.005 % ophthalmic solution Instill 1 drop into both eyes at bedtime 11/28/22   Corwin Levins, MD  levothyroxine (SYNTHROID) 125 MCG tablet Take 1 tablet (125 mcg total) by mouth daily. 11/29/22   Corwin Levins, MD  pantoprazole (PROTONIX) 40 MG tablet Take 1 tablet by mouth 2 times daily. 11/29/22   Corwin Levins, MD    Family History Family History  Problem Relation Age of Onset   Breast cancer Mother        deceased at age 68 secondary to complications of breast cancer   Diabetes Father    Stroke Father    Glaucoma Father    Hypertension Father    Blindness Father        leagally blind   Heart disease Father    Diabetes Brother    Alcohol abuse Brother    Seizures Brother    Other Neg Hx        prostate cancer, colon cancer    Social History Social History   Tobacco Use   Smoking status: Every Day    Packs/day: 0.75    Years: 38.00    Additional pack years: 0.00    Total pack years: 28.50    Types: Cigarettes   Smokeless tobacco: Never   Tobacco comments:    "light" smoker since age 58  Vaping Use   Vaping Use: Never used  Substance Use Topics   Alcohol use: Yes    Alcohol/week: 6.0 standard drinks of alcohol    Types: 6 Cans of  beer per week   Drug use: Not Currently    Types: Marijuana    Comment: OCCASIONAL none since 08/11/14     Allergies   Ace inhibitors   Review of Systems Review of Systems  Constitutional:  Negative for fever.  Gastrointestinal:  Positive for abdominal pain, diarrhea, nausea and vomiting.     Physical Exam Triage Vital Signs ED Triage Vitals  Enc Vitals Group     BP 04/04/23 1405 (!) 164/90     Pulse Rate 04/04/23 1405 80     Resp 04/04/23  1405 16     Temp 04/04/23 1405 98.3 F (36.8 C)     Temp Source 04/04/23 1405 Oral     SpO2 04/04/23 1405 98 %     Weight --      Height --      Head Circumference --      Peak Flow --      Pain Score 04/04/23 1407 10     Pain Loc --      Pain Edu? --      Excl. in GC? --    No data found.  Updated Vital Signs BP (!) 164/90 (BP Location: Left Arm)   Pulse 80   Temp 98.3 F (36.8 C) (Oral)   Resp 16   SpO2 98%   Visual Acuity Right Eye Distance:   Left Eye Distance:   Bilateral Distance:    Right Eye Near:   Left Eye Near:    Bilateral Near:     Physical Exam Vitals and nursing note reviewed.  Constitutional:      Appearance: He is well-developed.  HENT:     Head: Normocephalic and atraumatic.     Mouth/Throat:     Mouth: Mucous membranes are moist.  Cardiovascular:     Rate and Rhythm: Normal rate.  Pulmonary:     Effort: Pulmonary effort is normal. No respiratory distress.  Neurological:     Mental Status: He is alert.      UC Treatments / Results  Labs (all labs ordered are listed, but only abnormal results are displayed) Labs Reviewed - No data to display  EKG   Radiology No results found.  Procedures Procedures (including critical care time)  Medications Ordered in UC Medications  ondansetron (ZOFRAN) injection 4 mg (4 mg Intramuscular Given 04/04/23 1432)  alum & mag hydroxide-simeth (MAALOX/MYLANTA) 200-200-20 MG/5ML suspension 30 mL (30 mLs Oral Given 04/04/23 1446)  lidocaine  (XYLOCAINE) 2 % viscous mouth solution 15 mL (15 mLs Mouth/Throat Given 04/04/23 1446)    Initial Impression / Assessment and Plan / UC Course  I have reviewed the triage vital signs and the nursing notes.  Pertinent labs & imaging results that were available during my care of the patient were reviewed by me and considered in my medical decision making (see chart for details).  Vitals and triage reviewed, patient is hemodynamically stable.  He is vomiting in clinic, reports routine alcohol use, but did not eat last night.  Denies blood in stool or emesis.  Abdomen with tenderness to lower quadrants and epigastric area.  Without rebound or guarding.  Given IM Zofran and GI cocktail, patient reports emesis and abdominal pain have improved.  Encouraged to continue on Protonix, take Carafate with meals.  Strict emergency precautions given, patient verbalized understanding.  No questions at this time.     Final Clinical Impressions(s) / UC Diagnoses   Final diagnoses:  Epigastric pain  Gastritis without bleeding, unspecified chronicity, unspecified gastritis type     Discharge Instructions      Please decrease your alcohol intake, as this increases your risk for gastric ulcers.  You can take the Carafate with meals to help coat your stomach. Please take your Protonix daily.  If you are going to drink alcohol, it is important to have food on your stomach.  You can take the Zofran as needed for nausea.  I recommend a bland diet, starting with clear liquids and gradually progressing to broth, chicken, rice, toast, applesauce and bananas if tolerated.  Please seek immediate care if you have blood in your vomit or your stool, worsening abdominal pain fever, you have persistent vomiting despite nausea medication or if you develop any new concerning symptoms.  It is important that you follow-up with your primary care provider regarding your abdominal pain/gastritis.      ED Prescriptions      Medication Sig Dispense Auth. Provider   ondansetron (ZOFRAN-ODT) 8 MG disintegrating tablet Take 1 tablet (8 mg total) by mouth every 8 (eight) hours as needed for nausea or vomiting. 20 tablet Rinaldo Ratel, [place] N, FNP   sucralfate (CARAFATE) 1 GM/10ML suspension Take 10 mLs (1 g total) by mouth 4 (four) times daily -  with meals and at bedtime. 420 mL Idaly Verret, [place] N, [place]      PDMP not reviewed this encounter.   Annalese Stiner, [place] N, [place] 04/04/23 1459

## 2023-04-04 NOTE — ED Triage Notes (Signed)
Patient c/o mid abdominal pain and states that he has a history of ulcers. Patient stated, "I did something I should not have done last night. I drank some beer and didn't eat. I drank a 32 ounce."  Patient states he took Pantoprazole 40 mg 2 tabs this AM.

## 2023-04-04 NOTE — Telephone Encounter (Signed)
Provider states tablets sent in to the pharmacy. Called to inform the Patient. Patient verbalized understanding

## 2023-04-04 NOTE — Telephone Encounter (Signed)
Patient calling in and states that the Carafate liquid is not covered by his insurance and her requires the pills be sent in. Secure chat sent to the provider who saw him to ask for medication change.

## 2023-04-16 ENCOUNTER — Other Ambulatory Visit (HOSPITAL_COMMUNITY): Payer: Self-pay

## 2023-04-22 ENCOUNTER — Other Ambulatory Visit (HOSPITAL_COMMUNITY): Payer: Self-pay

## 2023-05-13 ENCOUNTER — Other Ambulatory Visit (HOSPITAL_COMMUNITY): Payer: Self-pay

## 2023-05-27 ENCOUNTER — Other Ambulatory Visit (HOSPITAL_COMMUNITY): Payer: Self-pay

## 2023-06-18 ENCOUNTER — Other Ambulatory Visit (HOSPITAL_COMMUNITY): Payer: Self-pay

## 2023-06-20 ENCOUNTER — Other Ambulatory Visit (HOSPITAL_COMMUNITY): Payer: Self-pay

## 2023-07-17 DIAGNOSIS — H2513 Age-related nuclear cataract, bilateral: Secondary | ICD-10-CM | POA: Diagnosis not present

## 2023-07-17 DIAGNOSIS — H40023 Open angle with borderline findings, high risk, bilateral: Secondary | ICD-10-CM | POA: Diagnosis not present

## 2023-08-08 ENCOUNTER — Other Ambulatory Visit: Payer: Self-pay | Admitting: Internal Medicine

## 2023-08-08 ENCOUNTER — Other Ambulatory Visit: Payer: Self-pay

## 2023-08-08 ENCOUNTER — Other Ambulatory Visit (HOSPITAL_COMMUNITY): Payer: Self-pay

## 2023-08-08 MED ORDER — LATANOPROST 0.005 % OP SOLN
1.0000 [drp] | Freq: Every day | OPHTHALMIC | 2 refills | Status: AC
  Filled 2023-08-08: qty 2.5, 25d supply, fill #0
  Filled 2023-08-29: qty 2.5, 25d supply, fill #1
  Filled 2023-09-23 – 2023-11-12 (×2): qty 2.5, 25d supply, fill #2

## 2023-08-08 MED ORDER — DORZOLAMIDE HCL 2 % OP SOLN
1.0000 [drp] | Freq: Two times a day (BID) | OPHTHALMIC | 3 refills | Status: AC
  Filled 2023-08-08: qty 10, 50d supply, fill #0
  Filled 2023-09-23: qty 10, 50d supply, fill #1
  Filled 2023-11-12: qty 10, 50d supply, fill #2
  Filled 2024-03-02: qty 10, 50d supply, fill #3

## 2023-08-11 ENCOUNTER — Other Ambulatory Visit (HOSPITAL_COMMUNITY): Payer: Self-pay

## 2023-08-11 MED ORDER — DORZOLAMIDE HCL 2 % OP SOLN
1.0000 [drp] | Freq: Two times a day (BID) | OPHTHALMIC | 3 refills | Status: DC
  Filled 2023-08-11 – 2024-01-01 (×2): qty 10, 50d supply, fill #0
  Filled 2024-04-21: qty 10, 50d supply, fill #1
  Filled 2024-06-08: qty 10, 50d supply, fill #2
  Filled 2024-07-26: qty 10, 50d supply, fill #3

## 2023-08-11 MED ORDER — LATANOPROST 0.005 % OP SOLN
1.0000 [drp] | Freq: Every evening | OPHTHALMIC | 3 refills | Status: DC
  Filled 2023-08-11: qty 7.5, 75d supply, fill #0
  Filled 2024-01-31: qty 2.5, 25d supply, fill #0
  Filled 2024-03-02: qty 2.5, 25d supply, fill #1
  Filled 2024-03-26: qty 2.5, 25d supply, fill #2
  Filled 2024-06-08: qty 2.5, 25d supply, fill #3
  Filled 2024-06-28: qty 2.5, 25d supply, fill #4
  Filled 2024-07-26: qty 7.5, 75d supply, fill #5

## 2023-08-28 ENCOUNTER — Other Ambulatory Visit (HOSPITAL_COMMUNITY): Payer: Self-pay

## 2023-08-29 ENCOUNTER — Other Ambulatory Visit (HOSPITAL_COMMUNITY): Payer: Self-pay

## 2023-09-02 ENCOUNTER — Other Ambulatory Visit (HOSPITAL_COMMUNITY): Payer: Self-pay

## 2023-09-16 ENCOUNTER — Encounter: Payer: Self-pay | Admitting: Internal Medicine

## 2023-09-23 ENCOUNTER — Other Ambulatory Visit (HOSPITAL_COMMUNITY): Payer: Self-pay

## 2023-09-27 ENCOUNTER — Other Ambulatory Visit (HOSPITAL_COMMUNITY): Payer: Self-pay

## 2023-11-03 ENCOUNTER — Other Ambulatory Visit (HOSPITAL_COMMUNITY): Payer: Self-pay

## 2023-11-03 ENCOUNTER — Ambulatory Visit: Payer: 59 | Admitting: Internal Medicine

## 2023-11-10 ENCOUNTER — Other Ambulatory Visit (HOSPITAL_COMMUNITY): Payer: Self-pay

## 2023-11-12 ENCOUNTER — Other Ambulatory Visit (HOSPITAL_COMMUNITY): Payer: Self-pay

## 2023-11-17 ENCOUNTER — Other Ambulatory Visit (HOSPITAL_COMMUNITY): Payer: Self-pay

## 2023-12-01 ENCOUNTER — Other Ambulatory Visit (HOSPITAL_COMMUNITY): Payer: Self-pay

## 2023-12-01 ENCOUNTER — Ambulatory Visit: Payer: Commercial Managed Care - PPO | Admitting: Internal Medicine

## 2023-12-01 VITALS — BP 152/80 | HR 74 | Temp 97.8°F | Ht 66.0 in | Wt 153.4 lb

## 2023-12-01 DIAGNOSIS — E782 Mixed hyperlipidemia: Secondary | ICD-10-CM

## 2023-12-01 DIAGNOSIS — E538 Deficiency of other specified B group vitamins: Secondary | ICD-10-CM

## 2023-12-01 DIAGNOSIS — M7702 Medial epicondylitis, left elbow: Secondary | ICD-10-CM

## 2023-12-01 DIAGNOSIS — E559 Vitamin D deficiency, unspecified: Secondary | ICD-10-CM | POA: Diagnosis not present

## 2023-12-01 DIAGNOSIS — Z125 Encounter for screening for malignant neoplasm of prostate: Secondary | ICD-10-CM | POA: Diagnosis not present

## 2023-12-01 DIAGNOSIS — R739 Hyperglycemia, unspecified: Secondary | ICD-10-CM

## 2023-12-01 DIAGNOSIS — I10 Essential (primary) hypertension: Secondary | ICD-10-CM

## 2023-12-01 DIAGNOSIS — R9431 Abnormal electrocardiogram [ECG] [EKG]: Secondary | ICD-10-CM | POA: Diagnosis not present

## 2023-12-01 DIAGNOSIS — Z0001 Encounter for general adult medical examination with abnormal findings: Secondary | ICD-10-CM

## 2023-12-01 DIAGNOSIS — E039 Hypothyroidism, unspecified: Secondary | ICD-10-CM | POA: Diagnosis not present

## 2023-12-01 DIAGNOSIS — F17200 Nicotine dependence, unspecified, uncomplicated: Secondary | ICD-10-CM

## 2023-12-01 MED ORDER — MELOXICAM 15 MG PO TABS
15.0000 mg | ORAL_TABLET | Freq: Every day | ORAL | 1 refills | Status: DC | PRN
  Filled 2023-12-01: qty 90, 90d supply, fill #0
  Filled 2024-03-22: qty 90, 90d supply, fill #1

## 2023-12-01 NOTE — Patient Instructions (Addendum)
Please remember to call for your colonoscopy, or go to the Third Floor at [address] to make appt with Dr Leone Payor for the colonoscopy  Please have your Shingrix (shingles) shots done at your local pharmacy.  Please take all new medication as prescribed - the anti- inflammatory for the left elbow  Ok to try the OTC Probiotic called Align (or one that is similar)  Please continue all other medications as before, and refills have been done if requested.  Please have the pharmacy call with any other refills you may need.  Please continue your efforts at being more active, low cholesterol diet, and weight control.  You are otherwise up to date with prevention measures today.  Please keep your appointments with your specialists as you may have planned  You will be contacted regarding the referral for: Cardiac CT score  Please go to the LAB at the blood drawing area for the tests to be done - at the [address] [facility] (in the basement lab)  You will be contacted by phone if any changes need to be made immediately.  Otherwise, you will receive a letter about your results with an explanation, but please check with MyChart first.  Please make an Appointment to return for your 1 year visit, or sooner if needed

## 2023-12-01 NOTE — Progress Notes (Unsigned)
Patient ID: Cory [name], male   DOB: [birth year], 58 y.o.   MRN: [id number]         Chief Complaint:: wellness exam and left elbow pain, hyperglycemia, hld low vit d, htn, low thyroid       HPI:  Cory [name] is a 58 y.o. male here for wellness exam; declines all immunizations, for colonoscopy, o/w up to date                        Also Pt denies chest pain, increased sob or doe, wheezing, orthopnea, PND, increased LE swelling, palpitations, dizziness or syncope.   Pt denies polydipsia, polyuria, or new focal neuro s/s.    Pt denies fever, wt loss, night sweats, loss of appetite, or other constitutional symptoms  does have mild to mod tender left medial elbow worse to use it at work.  BP has been controlled at home.  Willing for Card CT score.    Wt Readings from Last 3 Encounters:  12/01/23 153 lb 6 oz (69.6 kg)  11/28/22 162 lb (73.5 kg)  11/27/21 161 lb 9.6 oz (73.3 kg)   BP Readings from Last 3 Encounters:  12/01/23 (!) 152/80  04/04/23 (!) 164/90  11/28/22 130/76   Immunization History  Administered Date(s) Administered   Influenza Whole 07/17/2009, 07/30/2010   Influenza,inj,Quad PF,6+ Mos 07/09/2013, 12/12/2015, 11/28/2022   Influenza-Unspecified 09/04/2017, 09/03/2021, 08/18/2023   Janssen (J&J) SARS-COV-2 Vaccination 02/11/2020   Pneumococcal Polysaccharide-23 07/17/2009   Td 01/24/2003   Tdap 12/12/2015   Health Maintenance Due  Topic Date Due   Pneumococcal Vaccine 7-76 Years old (2 of 2 - PCV) 07/17/2010   Lung Cancer Screening  Never done   Zoster Vaccines- Shingrix (1 of 2) Never done   COVID-19 Vaccine (2 - 2024-25 season) 07/06/2023   Colonoscopy  09/10/2023      Past Medical History:  Diagnosis Date   Carpal tunnel syndrome of right wrist    CHF (congestive heart failure) (HCC)    Cholelithiasis    Colon polyps 09/09/2013   hyperplastic   Condyloma    Diverticulosis 09/09/2013   ED (erectile dysfunction)    GERD (gastroesophageal reflux  disease)    Hyperglycemia 04/14/2011   Hyperlipidemia    Hypertension    Hypothyroidism    Mild stage glaucoma(365.71)    Dr Mitzi Davenport   Tobacco abuse    Past Surgical History:  Procedure Laterality Date   COLONOSCOPY W/ BIOPSIES AND POLYPECTOMY  09/09/2013   hyperplastic polyps    reports that he has been smoking cigarettes. He has a 28.5 pack-year smoking history. He has never used smokeless tobacco. He reports current alcohol use of about 6.0 standard drinks of alcohol per week. He reports that he does not currently use drugs after having used the following drugs: Marijuana. family history includes Alcohol abuse in his brother; Blindness in his father; Breast cancer in his mother; Diabetes in his brother and father; Glaucoma in his father; Heart disease in his father; Hypertension in his father; Seizures in his brother; Stroke in his father. Allergies  Allergen Reactions   Ace Inhibitors Swelling    Lip swelling    Current Outpatient Medications on File Prior to Visit  Medication Sig Dispense Refill   amLODipine (NORVASC) 5 MG tablet Take 1 tablet (5 mg total) by mouth daily. 90 tablet 3   atorvastatin (LIPITOR) 80 MG tablet Take 1 tablet (80 mg total) by mouth daily. 90 tablet 3  Cholecalciferol 50 MCG (2000 UT) TABS Take 1 tablet by mouth daily. 90 tablet 3   dorzolamide (TRUSOPT) 2 % ophthalmic solution      dorzolamide (TRUSOPT) 2 % ophthalmic solution Instill 1 drop into the affected eye(s) twice daily. 10 mL 2   dorzolamide (TRUSOPT) 2 % ophthalmic solution Instill 1 drop into both eyes twice a day 10 mL 3   dorzolamide (TRUSOPT) 2 % ophthalmic solution Place 1 drop into both eyes twice a day 10 mL 3   dorzolamide (TRUSOPT) 2 % ophthalmic solution Place 1 drop into both eyes 2 (two) times daily. 10 mL 3   dorzolamide-timolol (COSOPT) 22.3-6.8 MG/ML ophthalmic solution Place 1 drop into both eyes daily. 10 mL 2   latanoprost (XALATAN) 0.005 % ophthalmic solution Place 1 drop  into both eyes 2 (two) times daily. 2.5 mL 2   latanoprost (XALATAN) 0.005 % ophthalmic solution Instill 1 drop to the affected eye(s) every night at bedtime. 2.5 mL 3   latanoprost (XALATAN) 0.005 % ophthalmic solution Instill 1 drop into both eyes at bedtime 2.5 mL 3   latanoprost (XALATAN) 0.005 % ophthalmic solution Instill 1 drop into both eyes at bedtime 2.5 mL 2   latanoprost (XALATAN) 0.005 % ophthalmic solution Place 1 drop into both eyes at bedtime. 7.5 mL 3   levothyroxine (SYNTHROID) 125 MCG tablet Take 1 tablet (125 mcg total) by mouth daily. 90 tablet 3   ondansetron (ZOFRAN-ODT) 8 MG disintegrating tablet Dissolve 1 tablet (8 mg total) by mouth every 8 (eight) hours as needed for nausea or vomiting. 20 tablet 0   pantoprazole (PROTONIX) 40 MG tablet Take 1 tablet by mouth 2 times daily. 180 tablet 3   sucralfate (CARAFATE) 1 GM/10ML suspension Take 10 mLs (1 g total) by mouth 4 (four) times daily -  with meals and at bedtime. 420 mL 0   sucralfate (CARAFATE) 1 g tablet Take 1 tablet (1 g total) by mouth 4 (four) times daily -  with meals and at bedtime. 120 tablet 0   No current facility-administered medications on file prior to visit.        ROS:  All others reviewed and negative.  Objective        PE:  BP (!) 152/80   Pulse 74   Temp 97.8 F (36.6 C) (Temporal)   Ht 5\' 6"  (1.676 m)   Wt 153 lb 6 oz (69.6 kg)   SpO2 98%   BMI 24.76 kg/m                 Constitutional: Pt appears in NAD               HENT: Head: NCAT.                Right Ear: External ear normal.                 Left Ear: External ear normal.                Eyes: . Pupils are equal, round, and reactive to light. Conjunctivae and EOM are normal               Nose: without d/c or deformity               Neck: Neck supple. Gross normal ROM               Cardiovascular: Normal rate and regular rhythm.  Pulmonary/Chest: Effort normal and breath sounds without rales or wheezing.                 Abd:  Soft, NT, ND, + BS, no organomegaly               Neurological: Pt is alert. At baseline orientation, motor grossly intact               Skin: Skin is warm. No rashes, no other new lesions, LE edema - none; tender left medial epicondylar               Psychiatric: Pt behavior is normal without agitation   Micro: none  Cardiac tracings I have personally interpreted today:  none  Pertinent Radiological findings (summarize): none   Lab Results  Component Value Date   WBC 8.5 12/02/2023   HGB 13.2 12/02/2023   HCT 39.7 12/02/2023   PLT 223.0 12/02/2023   GLUCOSE 99 12/02/2023   CHOL 168 12/02/2023   TRIG 165.0 (H) 12/02/2023   HDL 73.10 12/02/2023   LDLDIRECT 155.6 02/15/2008   LDLCALC 62 12/02/2023   ALT 26 12/02/2023   AST 29 12/02/2023   NA 136 12/02/2023   K 3.8 12/02/2023   CL 104 12/02/2023   CREATININE 1.34 12/02/2023   BUN 17 12/02/2023   CO2 24 12/02/2023   TSH 1.70 12/02/2023   PSA 3.77 12/02/2023   INR 1.2 (H) 04/13/2020   HGBA1C 5.9 12/02/2023   Assessment/Plan:  Cory [name] is a 58 y.o. Black or African American [2] male with  has a past medical history of Carpal tunnel syndrome of right wrist, CHF (congestive heart failure) (HCC), Cholelithiasis, Colon polyps (09/09/2013), Condyloma, Diverticulosis (09/09/2013), ED (erectile dysfunction), GERD (gastroesophageal reflux disease), Hyperglycemia (04/14/2011), Hyperlipidemia, Hypertension, Hypothyroidism, Mild stage glaucoma(365.71), and Tobacco abuse.  Encounter for well adult exam with abnormal findings Age and sex appropriate education and counseling updated with regular exercise and diet Referrals for preventative services - for colonoscopy Immunizations addressed - declines all immunizations Smoking counseling  - counsled to quit smoking, pt not ready Evidence for depression or other mood disorder - none significant Most recent labs reviewed. I have personally reviewed and have noted: 1) the  patient's medical and social history 2) The patient's current medications and supplements 3) The patient's height, weight, and BMI have been recorded in the chart   Hyperglycemia Lab Results  Component Value Date   HGBA1C 5.9 12/02/2023   Stable, pt to continue current medical treatment  - diet,wt control   Hyperlipidemia Lab Results  Component Value Date   LDLCALC 62 12/02/2023   Stable, pt to continue current statin lipitor 80 qd   Hypertension, uncontrolled BP Readings from Last 3 Encounters:  12/01/23 (!) 152/80  04/04/23 (!) 164/90  11/28/22 130/76   Uncontrolled here, pt states controled at home, delcines any change,, pt to continue medical treatment norvasc 5 every day, also for Card CT score    Hypothyroidism Lab Results  Component Value Date   TSH 1.70 12/02/2023   Stable, pt to continue levothyroxine 125 mcg qd   Vitamin D deficiency Last vitamin D Lab Results  Component Value Date   VD25OH 35.18 12/02/2023   Low, to start oral replacement   Medial epicondylitis of elbow, left Mild to mod, for mobic 15 every day prn,  to f/u any worsening symptoms or concerns  TOBACCO ABUSE Pt counsled to quit, pt not ready  Followup: Return in about 1 year (around  11/30/2024).  Oliver Barre, MD 12/03/2023 9:05 PM [facility]

## 2023-12-02 ENCOUNTER — Encounter: Payer: Self-pay | Admitting: Internal Medicine

## 2023-12-02 ENCOUNTER — Other Ambulatory Visit (INDEPENDENT_AMBULATORY_CARE_PROVIDER_SITE_OTHER): Payer: Commercial Managed Care - PPO

## 2023-12-02 ENCOUNTER — Other Ambulatory Visit (HOSPITAL_COMMUNITY): Payer: Self-pay

## 2023-12-02 DIAGNOSIS — Z125 Encounter for screening for malignant neoplasm of prostate: Secondary | ICD-10-CM

## 2023-12-02 DIAGNOSIS — R739 Hyperglycemia, unspecified: Secondary | ICD-10-CM | POA: Diagnosis not present

## 2023-12-02 DIAGNOSIS — E559 Vitamin D deficiency, unspecified: Secondary | ICD-10-CM

## 2023-12-02 DIAGNOSIS — E782 Mixed hyperlipidemia: Secondary | ICD-10-CM | POA: Diagnosis not present

## 2023-12-02 DIAGNOSIS — E538 Deficiency of other specified B group vitamins: Secondary | ICD-10-CM

## 2023-12-02 LAB — CBC WITH DIFFERENTIAL/PLATELET
Basophils Absolute: 0 10*3/uL (ref 0.0–0.1)
Basophils Relative: 0.5 % (ref 0.0–3.0)
Eosinophils Absolute: 0.1 10*3/uL (ref 0.0–0.7)
Eosinophils Relative: 1 % (ref 0.0–5.0)
HCT: 39.7 % (ref 39.0–52.0)
Hemoglobin: 13.2 g/dL (ref 13.0–17.0)
Lymphocytes Relative: 21.1 % (ref 12.0–46.0)
Lymphs Abs: 1.8 10*3/uL (ref 0.7–4.0)
MCHC: 33.2 g/dL (ref 30.0–36.0)
MCV: 90.4 fL (ref 78.0–100.0)
Monocytes Absolute: 0.7 10*3/uL (ref 0.1–1.0)
Monocytes Relative: 8 % (ref 3.0–12.0)
Neutro Abs: 5.9 10*3/uL (ref 1.4–7.7)
Neutrophils Relative %: 69.4 % (ref 43.0–77.0)
Platelets: 223 10*3/uL (ref 150.0–400.0)
RBC: 4.4 Mil/uL (ref 4.22–5.81)
RDW: 13.9 % (ref 11.5–15.5)
WBC: 8.5 10*3/uL (ref 4.0–10.5)

## 2023-12-02 LAB — BASIC METABOLIC PANEL
BUN: 17 mg/dL (ref 6–23)
CO2: 24 meq/L (ref 19–32)
Calcium: 9.3 mg/dL (ref 8.4–10.5)
Chloride: 104 meq/L (ref 96–112)
Creatinine, Ser: 1.34 mg/dL (ref 0.40–1.50)
GFR: 57.87 mL/min — ABNORMAL LOW (ref >60.00)
Glucose, Bld: 99 mg/dL (ref 70–99)
Potassium: 3.8 meq/L (ref 3.5–5.1)
Sodium: 136 meq/L (ref 135–145)

## 2023-12-02 LAB — HEPATIC FUNCTION PANEL
ALT: 26 U/L (ref 0–53)
AST: 29 U/L (ref 0–37)
Albumin: 4.3 g/dL (ref 3.5–5.2)
Alkaline Phosphatase: 66 U/L (ref 39–117)
Bilirubin, Direct: 0.1 mg/dL (ref 0.0–0.3)
Total Bilirubin: 0.4 mg/dL (ref 0.2–1.2)
Total Protein: 7.1 g/dL (ref 6.0–8.3)

## 2023-12-02 LAB — LIPID PANEL
Cholesterol: 168 mg/dL (ref 0–200)
HDL: 73.1 mg/dL (ref >39.00)
LDL Cholesterol: 62 mg/dL (ref 0–99)
NonHDL: 94.9
Total CHOL/HDL Ratio: 2
Triglycerides: 165 mg/dL — ABNORMAL HIGH (ref 0.0–149.0)
VLDL: 33 mg/dL (ref 0.0–40.0)

## 2023-12-02 LAB — URINALYSIS, ROUTINE W REFLEX MICROSCOPIC
Bilirubin Urine: NEGATIVE
Hgb urine dipstick: NEGATIVE
Ketones, ur: NEGATIVE
Nitrite: NEGATIVE
RBC / HPF: NONE SEEN (ref 0–?)
Specific Gravity, Urine: >1.03 — AB (ref 1.000–1.030)
Total Protein, Urine: NEGATIVE
Urine Glucose: NEGATIVE
Urobilinogen, UA: 0.2 — AB (ref 0.0–1.0)
pH: 6 (ref 5.0–8.0)

## 2023-12-02 LAB — HEMOGLOBIN A1C: Hgb A1c MFr Bld: 5.9 % (ref 4.6–6.5)

## 2023-12-02 LAB — TSH: TSH: 1.7 u[IU]/mL (ref 0.35–5.50)

## 2023-12-02 LAB — PSA: PSA: 3.77 ng/mL (ref 0.10–4.00)

## 2023-12-02 LAB — VITAMIN B12: Vitamin B-12: 216 pg/mL (ref 211–911)

## 2023-12-02 LAB — VITAMIN D 25 HYDROXY (VIT D DEFICIENCY, FRACTURES): VITD: 35.18 ng/mL (ref 30.00–100.00)

## 2023-12-02 NOTE — Progress Notes (Signed)
The test results show that your current treatment is OK, as the tests are stable.  Please continue the same plan.  There is no other need for change of treatment or further evaluation based on these results, at this time.  thanks

## 2023-12-03 ENCOUNTER — Encounter: Payer: Self-pay | Admitting: Internal Medicine

## 2023-12-03 DIAGNOSIS — M7702 Medial epicondylitis, left elbow: Secondary | ICD-10-CM | POA: Insufficient documentation

## 2023-12-03 NOTE — Assessment & Plan Note (Addendum)
Age and sex appropriate education and counseling updated with regular exercise and diet Referrals for preventative services - for colonoscopy Immunizations addressed - declines all immunizations Smoking counseling  - counsled to quit smoking, pt not ready Evidence for depression or other mood disorder - none significant Most recent labs reviewed. I have personally reviewed and have noted: 1) the patient's medical and social history 2) The patient's current medications and supplements 3) The patient's height, weight, and BMI have been recorded in the chart

## 2023-12-03 NOTE — Assessment & Plan Note (Signed)
Mild to mod, for mobic 15 every day prn,  to f/u any worsening symptoms or concerns

## 2023-12-03 NOTE — Assessment & Plan Note (Signed)
Last vitamin D Lab Results  Component Value Date   VD25OH 35.18 12/02/2023   Low, to start oral replacement

## 2023-12-03 NOTE — Assessment & Plan Note (Signed)
Lab Results  Component Value Date   LDLCALC 62 12/02/2023   Stable, pt to continue current statin lipitor 80 qd

## 2023-12-03 NOTE — Assessment & Plan Note (Signed)
Lab Results  Component Value Date   HGBA1C 5.9 12/02/2023   Stable, pt to continue current medical treatment  - diet, wt control

## 2023-12-03 NOTE — Assessment & Plan Note (Signed)
Lab Results  Component Value Date   TSH 1.70 12/02/2023   Stable, pt to continue levothyroxine 125 mcg qd

## 2023-12-03 NOTE — Assessment & Plan Note (Addendum)
BP Readings from Last 3 Encounters:  12/01/23 (!) 152/80  04/04/23 (!) 164/90  11/28/22 130/76   Uncontrolled here, pt states controled at home, delcines any change,, pt to continue medical treatment norvasc 5 every day, also for Card CT score

## 2023-12-03 NOTE — Assessment & Plan Note (Signed)
Pt counsled to quit, pt not ready

## 2023-12-05 ENCOUNTER — Other Ambulatory Visit: Payer: Self-pay

## 2023-12-25 ENCOUNTER — Other Ambulatory Visit: Payer: Self-pay

## 2023-12-25 ENCOUNTER — Other Ambulatory Visit: Payer: Self-pay | Admitting: Internal Medicine

## 2023-12-25 ENCOUNTER — Other Ambulatory Visit (HOSPITAL_COMMUNITY): Payer: Self-pay

## 2023-12-25 MED ORDER — LEVOTHYROXINE SODIUM 125 MCG PO TABS
125.0000 ug | ORAL_TABLET | Freq: Every day | ORAL | 3 refills | Status: DC
  Filled 2023-12-25: qty 90, 90d supply, fill #0
  Filled 2024-03-22: qty 90, 90d supply, fill #1

## 2023-12-25 MED ORDER — ATORVASTATIN CALCIUM 80 MG PO TABS
80.0000 mg | ORAL_TABLET | Freq: Every day | ORAL | 3 refills | Status: AC
  Filled 2023-12-25: qty 90, 90d supply, fill #0
  Filled 2024-03-22: qty 90, 90d supply, fill #1
  Filled 2024-06-08: qty 90, 90d supply, fill #2
  Filled 2024-09-01 (×2): qty 90, 90d supply, fill #3

## 2023-12-25 MED ORDER — PANTOPRAZOLE SODIUM 40 MG PO TBEC
40.0000 mg | DELAYED_RELEASE_TABLET | Freq: Two times a day (BID) | ORAL | 3 refills | Status: DC
  Filled 2023-12-25: qty 180, 90d supply, fill #0
  Filled 2024-06-08: qty 180, 90d supply, fill #1

## 2023-12-25 MED ORDER — AMLODIPINE BESYLATE 5 MG PO TABS
5.0000 mg | ORAL_TABLET | Freq: Every day | ORAL | 3 refills | Status: AC
  Filled 2023-12-25 – 2024-03-02 (×2): qty 90, 90d supply, fill #0
  Filled 2024-06-08: qty 90, 90d supply, fill #1
  Filled 2024-09-01 (×2): qty 90, 90d supply, fill #2

## 2024-01-01 ENCOUNTER — Other Ambulatory Visit (HOSPITAL_COMMUNITY): Payer: Self-pay

## 2024-01-31 ENCOUNTER — Other Ambulatory Visit (HOSPITAL_COMMUNITY): Payer: Self-pay

## 2024-02-09 ENCOUNTER — Ambulatory Visit (HOSPITAL_COMMUNITY)
Admission: EM | Admit: 2024-02-09 | Discharge: 2024-02-09 | Disposition: A | Discharge status: Home / Self Care | Attending: Physician Assistant | Admitting: Physician Assistant

## 2024-02-09 ENCOUNTER — Other Ambulatory Visit (HOSPITAL_COMMUNITY): Payer: Self-pay

## 2024-02-09 ENCOUNTER — Encounter (HOSPITAL_COMMUNITY): Payer: Self-pay | Admitting: Emergency Medicine

## 2024-02-09 ENCOUNTER — Other Ambulatory Visit: Payer: Self-pay

## 2024-02-09 DIAGNOSIS — K219 Gastro-esophageal reflux disease without esophagitis: Secondary | ICD-10-CM

## 2024-02-09 DIAGNOSIS — R112 Nausea with vomiting, unspecified: Secondary | ICD-10-CM | POA: Diagnosis not present

## 2024-02-09 DIAGNOSIS — R197 Diarrhea, unspecified: Secondary | ICD-10-CM

## 2024-02-09 MED ORDER — SUCRALFATE 1 G PO TABS
1.0000 g | ORAL_TABLET | Freq: Four times a day (QID) | ORAL | 1 refills | Status: DC
  Filled 2024-02-09: qty 120, 30d supply, fill #0
  Filled 2024-06-08: qty 120, 30d supply, fill #1

## 2024-02-09 MED ORDER — ONDANSETRON 4 MG PO TBDP
ORAL_TABLET | ORAL | Status: AC
  Filled 2024-02-09: qty 1

## 2024-02-09 MED ORDER — ONDANSETRON 4 MG PO TBDP
4.0000 mg | ORAL_TABLET | Freq: Once | ORAL | Status: AC
  Administered 2024-02-09: 4 mg via ORAL

## 2024-02-09 MED ORDER — ONDANSETRON 4 MG PO TBDP
4.0000 mg | ORAL_TABLET | Freq: Three times a day (TID) | ORAL | 0 refills | Status: DC | PRN
  Filled 2024-02-09: qty 20, 7d supply, fill #0

## 2024-02-09 NOTE — ED Provider Notes (Signed)
 [facility]    CSN: [id number] Arrival date & time: 02/09/24  1606      History   Chief Complaint Chief Complaint  Patient presents with   Abdominal Pain    HPI Cory [name] is a 58 y.o. male.   Patient complains of nausea vomiting and diarrhea.  Patient reports that he has a past medical history of reflux and peptic ulcer disease.  Patient reports he has been experiencing some discomfort.  He started taking Carafate and reports symptoms are improving.  Patient reports he has been seen by GI in the past.  He does drink alcohol.  Patient states that he has not been able to tolerate eating but he is drinking Pedialyte.  Patient complains of some continued nausea.  Patient reports that this feels like the exacerbations that he has had of his ulcerative symptoms in the past.  The history is provided by the patient. No language interpreter was used.  Abdominal Pain Associated symptoms: diarrhea and vomiting     Past Medical History:  Diagnosis Date   Carpal tunnel syndrome of right wrist    CHF (congestive heart failure) (HCC)    Cholelithiasis    Colon polyps 09/09/2013   hyperplastic   Condyloma    Diverticulosis 09/09/2013   ED (erectile dysfunction)    GERD (gastroesophageal reflux disease)    Hyperglycemia 04/14/2011   Hyperlipidemia    Hypertension    Hypothyroidism    Mild stage glaucoma(365.71)    Dr Mitzi Davenport   Tobacco abuse     Patient Active Problem List   Diagnosis Date Noted   Medial epicondylitis of elbow, left 12/03/2023   Glaucoma 12/01/2022   Right lateral epicondylitis 12/01/2022   Corn or callus 11/27/2021   Iron deficiency 06/25/2021   Hematochezia 04/13/2020   Arthritis of right acromioclavicular joint 07/22/2019   Right shoulder pain 06/10/2019   Right knee pain 06/10/2019   Headache 01/27/2019   Vitamin D deficiency 01/11/2018   Angioedema 01/11/2018   Right ear impacted cerumen 12/30/2017   Toe pain, right 12/30/2017    Tinea cruris 04/08/2017   Numbness of fingers 04/08/2017   Nausea with vomiting 02/14/2016   Diarrhea 02/14/2016   Elbow strain 12/29/2015   Abdominal tenderness, unspecified site 12/12/2015   Gastroesophageal reflux disease with esophagitis 12/12/2015   Dysphagia 12/12/2015   Numbness and tingling in right hand 10/24/2015   Muscle cramps 10/24/2015   Rectal bleeding 07/09/2013   Encounter for well adult exam with abnormal findings 03/19/2013   Hemorrhoids, internal, with bleeding, and prolapse 08/26/2011   Hyperglycemia 04/14/2011   CARPAL TUNNEL SYNDROME, RIGHT 01/08/2011   Hypertension, uncontrolled 09/17/2010   CONDYLOMA 07/30/2010   BACK PAIN 07/23/2010   HAND PAIN, RIGHT 07/23/2010   ERECTILE DYSFUNCTION, ORGANIC 03/12/2010   HOARSENESS 05/12/2009   Hyperlipidemia 01/07/2008   TOBACCO ABUSE 01/07/2008   Hypothyroidism 07/04/2007   CHOLELITHIASIS 07/04/2007    Past Surgical History:  Procedure Laterality Date   COLONOSCOPY W/ BIOPSIES AND POLYPECTOMY  09/09/2013   hyperplastic polyps       Home Medications    Prior to Admission medications   Medication Sig Start Date End Date Taking? Authorizing Provider  amLODipine (NORVASC) 5 MG tablet Take 1 tablet (5 mg total) by mouth daily. 12/25/23   Corwin Levins, MD  atorvastatin (LIPITOR) 80 MG tablet Take 1 tablet (80 mg total) by mouth daily. 12/25/23   Corwin Levins, MD  Cholecalciferol 50 MCG (2000 UT) TABS Take  1 tablet by mouth daily. 10/05/20   Corwin Levins, MD  dorzolamide (TRUSOPT) 2 % ophthalmic solution  12/11/15   [provider]  dorzolamide (TRUSOPT) 2 % ophthalmic solution Instill 1 drop into the affected eye(s) twice daily. 03/30/21     dorzolamide (TRUSOPT) 2 % ophthalmic solution Instill 1 drop into both eyes twice a day 12/06/21     dorzolamide (TRUSOPT) 2 % ophthalmic solution Place 1 drop into both eyes twice a day 08/08/23   Corwin Levins, MD  dorzolamide (TRUSOPT) 2 % ophthalmic solution Place 1  drop into both eyes 2 (two) times daily. 08/11/23     dorzolamide-timolol (COSOPT) 22.3-6.8 MG/ML ophthalmic solution Place 1 drop into both eyes daily. 10/11/14   Reuben Likes, MD  latanoprost (XALATAN) 0.005 % ophthalmic solution Place 1 drop into both eyes 2 (two) times daily. 10/11/14   Reuben Likes, MD  latanoprost (XALATAN) 0.005 % ophthalmic solution Instill 1 drop to the affected eye(s) every night at bedtime. 03/30/21     latanoprost (XALATAN) 0.005 % ophthalmic solution Instill 1 drop into both eyes at bedtime 12/06/21     latanoprost (XALATAN) 0.005 % ophthalmic solution Instill 1 drop into both eyes at bedtime 08/08/23   Corwin Levins, MD  latanoprost (XALATAN) 0.005 % ophthalmic solution Place 1 drop into both eyes at bedtime. 08/11/23     levothyroxine (SYNTHROID) 125 MCG tablet Take 1 tablet (125 mcg total) by mouth daily. 12/25/23   Corwin Levins, MD  meloxicam (MOBIC) 15 MG tablet Take 1 tablet (15 mg total) by mouth daily as needed. 12/01/23   Corwin Levins, MD  ondansetron (ZOFRAN-ODT) 8 MG disintegrating tablet Dissolve 1 tablet (8 mg total) by mouth every 8 (eight) hours as needed for nausea or vomiting. 04/04/23   Garrison, [place] N, FNP  pantoprazole (PROTONIX) 40 MG tablet Take 1 tablet by mouth 2 times daily. 12/25/23   Corwin Levins, MD  sucralfate (CARAFATE) 1 g tablet Take 1 tablet (1 g total) by mouth 4 (four) times daily -  with meals and at bedtime. 04/04/23 05/04/23  Garrison, [place] N, FNP  sucralfate (CARAFATE) 1 GM/10ML suspension Take 10 mLs (1 g total) by mouth 4 (four) times daily -  with meals and at bedtime. 04/04/23   Garrison, [place] N, FNP    Family History Family History  Problem Relation Age of Onset   Breast cancer Mother        deceased at age 84 secondary to complications of breast cancer   Diabetes Father    Stroke Father    Glaucoma Father    Hypertension Father    Blindness Father        leagally blind   Heart disease Father    Diabetes Brother     Alcohol abuse Brother    Seizures Brother    Other Neg Hx        prostate cancer, colon cancer    Social History Social History   Tobacco Use   Smoking status: Every Day    Current packs/day: 0.75    Average packs/day: 0.8 packs/day for 38.0 years (28.5 ttl pk-yrs)    Types: Cigarettes   Smokeless tobacco: Never   Tobacco comments:    "light" smoker since age 54  Vaping Use   Vaping status: Never Used  Substance Use Topics   Alcohol use: Yes    Alcohol/week: 6.0 standard drinks of alcohol    Types: 6 Cans of  beer per week   Drug use: Not Currently    Types: Marijuana    Comment: OCCASIONAL none since 08/11/14     Allergies   Ace inhibitors   Review of Systems Review of Systems  Gastrointestinal:  Positive for abdominal pain, diarrhea and vomiting.  All other systems reviewed and are negative.    Physical Exam Triage Vital Signs ED Triage Vitals  Encounter Vitals Group     BP 02/09/24 1650 (!) 147/82     Systolic BP Percentile --      Diastolic BP Percentile --      Pulse Rate 02/09/24 1650 66     Resp 02/09/24 1650 18     Temp 02/09/24 1650 98.1 F (36.7 C)     Temp Source 02/09/24 1650 Oral     SpO2 02/09/24 1650 98 %     Weight --      Height --      Head Circumference --      Peak Flow --      Pain Score 02/09/24 1647 9     Pain Loc --      Pain Education --      Exclude from Growth Chart --    No data found.  Updated Vital Signs BP (!) 147/82 (BP Location: Left Arm)   Pulse 66   Temp 98.1 F (36.7 C) (Oral)   Resp 18   SpO2 98%   Visual Acuity Right Eye Distance:   Left Eye Distance:   Bilateral Distance:    Right Eye Near:   Left Eye Near:    Bilateral Near:     Physical Exam Vitals and nursing note reviewed.  Constitutional:      Appearance: He is well-developed.  HENT:     Head: Normocephalic.  Cardiovascular:     Rate and Rhythm: Normal rate.  Pulmonary:     Effort: Pulmonary effort is normal.  Abdominal:      General: Abdomen is flat. Bowel sounds are normal. There is no distension.     Palpations: Abdomen is soft.     Tenderness: There is generalized abdominal tenderness.  Musculoskeletal:        General: Normal range of motion.     Cervical back: Normal range of motion.  Skin:    General: Skin is warm.  Neurological:     General: No focal deficit present.     Mental Status: He is alert and oriented to person, place, and time.      UC Treatments / Results  Labs (all labs ordered are listed, but only abnormal results are displayed) Labs Reviewed - No data to display  EKG   Radiology No results found.  Procedures Procedures (including critical care time)  Medications Ordered in UC Medications  ondansetron (ZOFRAN-ODT) disintegrating tablet 4 mg (has no administration in time range)    Initial Impression / Assessment and Plan / UC Course  I have reviewed the triage vital signs and the nursing notes.  Pertinent labs & imaging results that were available during my care of the patient were reviewed by me and considered in my medical decision making (see chart for details).     Patient only has 4 Carafate tablets left.  I will give him a prescription for Carafate and Zofran.  Patient is advised to avoid alcohol clear liquids for the next 12 hours and then slowly progress diet.  Patient may be having some symptoms of his GERD or peptic ulcer I suspect  patient has had viral illness that triggered the nausea and vomiting.  He is advised to schedule to see GI for recheck he is advised to go to the emergency department if symptoms worsen or change. Final Clinical Impressions(s) / UC Diagnoses   Final diagnoses:  Nausea vomiting and diarrhea  Gastroesophageal reflux disease without esophagitis   Discharge Instructions   None    ED Prescriptions     Medication Sig Dispense Auth. Provider   sucralfate (CARAFATE) 1 g tablet Take 1 tablet (1 g total) by mouth 4 (four) times daily.  120 tablet Aybree Lanyon K, PA-C   ondansetron (ZOFRAN-ODT) 4 MG disintegrating tablet Take 1 tablet (4 mg total) by mouth every 8 (eight) hours as needed for nausea or vomiting. 20 tablet Elson Areas, [place]      PDMP not reviewed this encounter. An After Visit Summary was printed and given to the patient.       Elson Areas, [place] 02/09/24 1749

## 2024-02-09 NOTE — ED Triage Notes (Signed)
 Patient reports feeling bad on Saturday.  Reports stomach pain.  Has had vomiting and diarrhea since Saturday, but is slowing down.  Patient has been taking carafate and pedialyte

## 2024-02-10 ENCOUNTER — Other Ambulatory Visit (HOSPITAL_COMMUNITY): Payer: Self-pay

## 2024-02-16 ENCOUNTER — Other Ambulatory Visit (HOSPITAL_COMMUNITY): Payer: Self-pay

## 2024-02-16 ENCOUNTER — Encounter: Payer: Self-pay | Admitting: Internal Medicine

## 2024-02-16 ENCOUNTER — Other Ambulatory Visit (HOSPITAL_COMMUNITY)

## 2024-02-16 ENCOUNTER — Ambulatory Visit: Admitting: Internal Medicine

## 2024-02-16 VITALS — BP 136/70 | HR 67 | Temp 97.8°F | Ht 66.0 in | Wt 154.0 lb

## 2024-02-16 DIAGNOSIS — E538 Deficiency of other specified B group vitamins: Secondary | ICD-10-CM | POA: Insufficient documentation

## 2024-02-16 DIAGNOSIS — N529 Male erectile dysfunction, unspecified: Secondary | ICD-10-CM | POA: Diagnosis not present

## 2024-02-16 DIAGNOSIS — N289 Disorder of kidney and ureter, unspecified: Secondary | ICD-10-CM | POA: Diagnosis not present

## 2024-02-16 DIAGNOSIS — R972 Elevated prostate specific antigen [PSA]: Secondary | ICD-10-CM | POA: Insufficient documentation

## 2024-02-16 DIAGNOSIS — I10 Essential (primary) hypertension: Secondary | ICD-10-CM

## 2024-02-16 MED ORDER — SILDENAFIL CITRATE 100 MG PO TABS
50.0000 mg | ORAL_TABLET | Freq: Every day | ORAL | 11 refills | Status: AC | PRN
  Filled 2024-02-16: qty 30, 30d supply, fill #0
  Filled 2024-03-27: qty 30, 30d supply, fill #1
  Filled 2024-04-21: qty 30, 30d supply, fill #2
  Filled 2024-08-25: qty 30, 30d supply, fill #3
  Filled 2024-11-15: qty 6, 30d supply, fill #4

## 2024-02-16 NOTE — Assessment & Plan Note (Signed)
 BP Readings from Last 3 Encounters:  02/16/24 136/70  02/09/24 (!) 147/82  12/01/23 (!) 152/80   Stable, pt to continue medical treatment norvasc 5 mg qd

## 2024-02-16 NOTE — Assessment & Plan Note (Signed)
 Jan 2025 mild worsening , I suspect recent lower volume - for increased fluids, and f/u bmp next labs

## 2024-02-16 NOTE — Progress Notes (Signed)
 Patient ID: Cory [name], male   DOB: 02-17-1966, 58 y.o.   MRN: [id number]        Chief Complaint: follow up HTN, increased psa velocity, renal insufficiency, ED and low b12       HPI:  Cory [name] is a 58 y.o. male here with c/o worsening ED symptoms for over 6 mo, asking for new Viagra   Did already take one of his buddies pills and worked ok.  Pt denies chest pain, increased sob or doe, wheezing, orthopnea, PND, increased LE swelling, palpitations, dizziness or syncope.   Pt denies polydipsia, polyuria, or new focal neuro s/s.    Pt denies fever, wt loss, night sweats, loss of appetite, or other constitutional symptoms  Denies urinary symptoms such as dysuria, frequency, urgency, flank pain, hematuria or n/v, fever, chills.         Wt Readings from Last 3 Encounters:  02/16/24 154 lb (69.9 kg)  12/01/23 153 lb 6 oz (69.6 kg)  11/28/22 162 lb (73.5 kg)   BP Readings from Last 3 Encounters:  02/16/24 136/70  02/09/24 (!) 147/82  12/01/23 (!) 152/80         Past Medical History:  Diagnosis Date   Carpal tunnel syndrome of right wrist    CHF (congestive heart failure) (HCC)    Cholelithiasis    Colon polyps 09/09/2013   hyperplastic   Condyloma    Diverticulosis 09/09/2013   ED (erectile dysfunction)    GERD (gastroesophageal reflux disease)    Hyperglycemia 04/14/2011   Hyperlipidemia    Hypertension    Hypothyroidism    Mild stage glaucoma(365.71)    Dr Mitzi Davenport   Tobacco abuse    Past Surgical History:  Procedure Laterality Date   COLONOSCOPY W/ BIOPSIES AND POLYPECTOMY  09/09/2013   hyperplastic polyps    reports that he has been smoking cigarettes. He has a 28.5 pack-year smoking history. He has never used smokeless tobacco. He reports current alcohol use of about 6.0 standard drinks of alcohol per week. He reports that he does not currently use drugs after having used the following drugs: Marijuana. family history includes Alcohol abuse in his brother;  Blindness in his father; Breast cancer in his mother; Diabetes in his brother and father; Glaucoma in his father; Heart disease in his father; Hypertension in his father; Seizures in his brother; Stroke in his father. Allergies  Allergen Reactions   Ace Inhibitors Swelling    Lip swelling    Current Outpatient Medications on File Prior to Visit  Medication Sig Dispense Refill   amLODipine (NORVASC) 5 MG tablet Take 1 tablet (5 mg total) by mouth daily. 90 tablet 3   atorvastatin (LIPITOR) 80 MG tablet Take 1 tablet (80 mg total) by mouth daily. 90 tablet 3   Cholecalciferol 50 MCG (2000 UT) TABS Take 1 tablet by mouth daily. 90 tablet 3   dorzolamide (TRUSOPT) 2 % ophthalmic solution      dorzolamide (TRUSOPT) 2 % ophthalmic solution Instill 1 drop into the affected eye(s) twice daily. 10 mL 2   dorzolamide (TRUSOPT) 2 % ophthalmic solution Instill 1 drop into both eyes twice a day 10 mL 3   dorzolamide (TRUSOPT) 2 % ophthalmic solution Place 1 drop into both eyes twice a day 10 mL 3   dorzolamide (TRUSOPT) 2 % ophthalmic solution Place 1 drop into both eyes 2 (two) times daily. 10 mL 3   dorzolamide-timolol (COSOPT) 22.3-6.8 MG/ML ophthalmic solution Place 1 drop into  both eyes daily. 10 mL 2   latanoprost (XALATAN) 0.005 % ophthalmic solution Place 1 drop into both eyes 2 (two) times daily. 2.5 mL 2   latanoprost (XALATAN) 0.005 % ophthalmic solution Instill 1 drop to the affected eye(s) every night at bedtime. 2.5 mL 3   latanoprost (XALATAN) 0.005 % ophthalmic solution Instill 1 drop into both eyes at bedtime 2.5 mL 3   latanoprost (XALATAN) 0.005 % ophthalmic solution Instill 1 drop into both eyes at bedtime 2.5 mL 2   latanoprost (XALATAN) 0.005 % ophthalmic solution Place 1 drop into both eyes at bedtime. 7.5 mL 3   levothyroxine (SYNTHROID) 125 MCG tablet Take 1 tablet (125 mcg total) by mouth daily. 90 tablet 3   meloxicam (MOBIC) 15 MG tablet Take 1 tablet (15 mg total) by mouth  daily as needed. 90 tablet 1   ondansetron (ZOFRAN-ODT) 4 MG disintegrating tablet Dissolve 1 tablet (4 mg total) by mouth every 8 (eight) hours as needed for nausea or vomiting. 20 tablet 0   pantoprazole (PROTONIX) 40 MG tablet Take 1 tablet by mouth 2 times daily. 180 tablet 3   sucralfate (CARAFATE) 1 g tablet Take 1 tablet (1 g total) by mouth 4 (four) times daily. 120 tablet 1   No current facility-administered medications on file prior to visit.        ROS:  All others reviewed and negative.  Objective        PE:  BP 136/70 (BP Location: Right Arm, Patient Position: Sitting, Cuff Size: Normal)   Pulse 67   Temp 97.8 F (36.6 C) (Oral)   Ht 5\' 6"  (1.676 m)   Wt 154 lb (69.9 kg)   SpO2 99%   BMI 24.86 kg/m                 Constitutional: Pt appears in NAD               HENT: Head: NCAT.                Right Ear: External ear normal.                 Left Ear: External ear normal.                Eyes: . Pupils are equal, round, and reactive to light. Conjunctivae and EOM are normal               Nose: without d/c or deformity               Neck: Neck supple. Gross normal ROM               Cardiovascular: Normal rate and regular rhythm.                 Pulmonary/Chest: Effort normal and breath sounds without rales or wheezing.                Abd:  Soft, NT, ND, + BS, no organomegaly               Neurological: Pt is alert. At baseline orientation, motor grossly intact               Skin: Skin is warm. No rashes, no other new lesions, LE edema - none               Psychiatric: Pt behavior is normal without agitation   Micro: none  Cardiac tracings I have personally  interpreted today:  none  Pertinent Radiological findings (summarize): none   Lab Results  Component Value Date   WBC 8.5 12/02/2023   HGB 13.2 12/02/2023   HCT 39.7 12/02/2023   PLT 223.0 12/02/2023   GLUCOSE 99 12/02/2023   CHOL 168 12/02/2023   TRIG 165.0 (H) 12/02/2023   HDL 73.10 12/02/2023    LDLDIRECT 155.6 02/15/2008   LDLCALC 62 12/02/2023   ALT 26 12/02/2023   AST 29 12/02/2023   NA 136 12/02/2023   K 3.8 12/02/2023   CL 104 12/02/2023   CREATININE 1.34 12/02/2023   BUN 17 12/02/2023   CO2 24 12/02/2023   TSH 1.70 12/02/2023   PSA 3.77 12/02/2023   INR 1.2 (H) 04/13/2020   HGBA1C 5.9 12/02/2023   Assessment/Plan:  Cory [name] is a 58 y.o. Black or African American [2] male with  has a past medical history of Carpal tunnel syndrome of right wrist, CHF (congestive heart failure) (HCC), Cholelithiasis, Colon polyps (09/09/2013), Condyloma, Diverticulosis (09/09/2013), ED (erectile dysfunction), GERD (gastroesophageal reflux disease), Hyperglycemia (04/14/2011), Hyperlipidemia, Hypertension, Hypothyroidism, Mild stage glaucoma(365.71), and Tobacco abuse.  Hypertension, uncontrolled BP Readings from Last 3 Encounters:  02/16/24 136/70  02/09/24 (!) 147/82  12/01/23 (!) 152/80   Stable, pt to continue medical treatment norvasc 5 mg qd   B12 deficiency Lab Results  Component Value Date   VITAMINB12 216 12/02/2023   Low, to start oral replacement - b12 1000 mcg qd   Renal insufficiency Jan 2025 mild worsening , I suspect recent lower volume - for increased fluids, and f/u bmp next labs  ERECTILE DYSFUNCTION, ORGANIC Mild to mod, for viagra prn to f/u any worsening symptoms or concerns,, declines further testosteorone level  Increased prostate specific antigen (PSA) velocity Lab Results  Component Value Date   PSA 3.77 12/02/2023   PSA 3.14 11/28/2022   PSA 3.02 11/27/2021   Also for f/u psa in July 2025 next labs  Followup: Return in about 6 months (around 08/17/2024), or if symptoms worsen or fail to improve.  Rosalia Colonel, MD 02/16/2024 9:14 AM [facility]

## 2024-02-16 NOTE — Assessment & Plan Note (Signed)
 Lab Results  Component Value Date   PSA 3.77 12/02/2023   PSA 3.14 11/28/2022   PSA 3.02 11/27/2021   Also for f/u psa in July 2025 next labs

## 2024-02-16 NOTE — Assessment & Plan Note (Signed)
 Mild to mod, for viagra prn to f/u any worsening symptoms or concerns,, declines further testosteorone level

## 2024-02-16 NOTE — Patient Instructions (Signed)
 Please take all new medication as prescribed Please continue all other medications as before, and refills have been done if requested.  Please have the pharmacy call with any other refills you may need.  Please keep your appointments with your specialists as you may have planned  Please make LAB appt for July 2025 f/u labs  You will be contacted by phone if any changes need to be made immediately.  Otherwise, you will receive a letter about your results with an explanation, but please check with MyChart first.

## 2024-02-16 NOTE — Assessment & Plan Note (Signed)
 Lab Results  Component Value Date   VITAMINB12 216 12/02/2023   Low, to start oral replacement - b12 1000 mcg qd

## 2024-03-02 ENCOUNTER — Other Ambulatory Visit (HOSPITAL_COMMUNITY): Payer: Self-pay

## 2024-03-22 ENCOUNTER — Other Ambulatory Visit (HOSPITAL_COMMUNITY): Payer: Self-pay

## 2024-03-26 ENCOUNTER — Telehealth: Payer: Self-pay | Admitting: Internal Medicine

## 2024-03-26 ENCOUNTER — Other Ambulatory Visit (HOSPITAL_COMMUNITY): Payer: Self-pay

## 2024-03-26 MED ORDER — LEVOTHYROXINE SODIUM 125 MCG PO TABS: 125.0000 ug | ORAL_TABLET | Freq: Every day | ORAL | 3 refills | Status: AC

## 2024-03-26 NOTE — Telephone Encounter (Unsigned)
 Copied from CRM [id number]. Topic: Clinical - Prescription Issue >> Mar 26, 2024  3:26 PM Howard Macho wrote: Reason for CRM: patient called stating his thyroid  medication is on back order at the pharmacy and the pharmacy told him to call his doctor. The patient stated that they can send it to the [facility] on [address] in [place] where his other medication is at  Cb [phone]

## 2024-03-26 NOTE — Telephone Encounter (Signed)
Ok this is done erx 

## 2024-03-27 ENCOUNTER — Other Ambulatory Visit (HOSPITAL_COMMUNITY): Payer: Self-pay

## 2024-04-02 NOTE — Telephone Encounter (Signed)
 Copied from CRM [id number]. Topic: Clinical - Prescription Issue  >> Apr 02, 2024 10:07 AM Howard Macho wrote: Patient called checking on his medication refill. I let the patient know that the medication was sent to [facility]

## 2024-04-05 ENCOUNTER — Other Ambulatory Visit (HOSPITAL_COMMUNITY): Payer: Self-pay

## 2024-04-09 ENCOUNTER — Other Ambulatory Visit (HOSPITAL_COMMUNITY): Payer: Self-pay

## 2024-04-21 ENCOUNTER — Other Ambulatory Visit (HOSPITAL_COMMUNITY): Payer: Self-pay

## 2024-04-21 ENCOUNTER — Other Ambulatory Visit: Payer: Self-pay

## 2024-04-22 ENCOUNTER — Other Ambulatory Visit: Payer: Self-pay | Admitting: Internal Medicine

## 2024-04-22 ENCOUNTER — Other Ambulatory Visit (HOSPITAL_COMMUNITY): Payer: Self-pay

## 2024-04-22 ENCOUNTER — Other Ambulatory Visit: Payer: Self-pay

## 2024-04-22 MED ORDER — LEVOTHYROXINE SODIUM 125 MCG PO TABS
125.0000 ug | ORAL_TABLET | Freq: Every day | ORAL | 3 refills | Status: AC
  Filled 2024-04-22 – 2024-06-08 (×2): qty 90, 90d supply, fill #0
  Filled 2024-09-01: qty 90, 90d supply, fill #1

## 2024-04-27 ENCOUNTER — Other Ambulatory Visit: Payer: Self-pay

## 2024-05-03 ENCOUNTER — Other Ambulatory Visit (HOSPITAL_COMMUNITY): Payer: Self-pay

## 2024-05-05 ENCOUNTER — Encounter (HOSPITAL_COMMUNITY): Payer: Self-pay

## 2024-05-05 ENCOUNTER — Other Ambulatory Visit (HOSPITAL_COMMUNITY): Payer: Self-pay

## 2024-05-11 ENCOUNTER — Other Ambulatory Visit (HOSPITAL_COMMUNITY): Payer: Self-pay

## 2024-05-12 ENCOUNTER — Other Ambulatory Visit: Payer: Self-pay

## 2024-05-12 ENCOUNTER — Other Ambulatory Visit (INDEPENDENT_AMBULATORY_CARE_PROVIDER_SITE_OTHER)

## 2024-05-12 DIAGNOSIS — R972 Elevated prostate specific antigen [PSA]: Secondary | ICD-10-CM | POA: Diagnosis not present

## 2024-05-12 DIAGNOSIS — I10 Essential (primary) hypertension: Secondary | ICD-10-CM | POA: Diagnosis not present

## 2024-05-12 LAB — BASIC METABOLIC PANEL WITH GFR
BUN: 15 mg/dL (ref 6–23)
CO2: 27 meq/L (ref 19–32)
Calcium: 8.9 mg/dL (ref 8.4–10.5)
Chloride: 102 meq/L (ref 96–112)
Creatinine, Ser: 1 mg/dL (ref 0.40–1.50)
GFR: 81.97 mL/min (ref >60.00)
Glucose, Bld: 99 mg/dL (ref 70–99)
Potassium: 3.7 meq/L (ref 3.5–5.1)
Sodium: 135 meq/L (ref 135–145)

## 2024-05-12 LAB — PSA: PSA: 6.17 ng/mL — ABNORMAL HIGH (ref 0.10–4.00)

## 2024-05-13 ENCOUNTER — Other Ambulatory Visit: Payer: Self-pay | Admitting: Internal Medicine

## 2024-05-13 ENCOUNTER — Other Ambulatory Visit

## 2024-05-13 ENCOUNTER — Ambulatory Visit: Payer: Self-pay | Admitting: Internal Medicine

## 2024-05-13 DIAGNOSIS — R972 Elevated prostate specific antigen [PSA]: Secondary | ICD-10-CM

## 2024-05-25 DIAGNOSIS — H40023 Open angle with borderline findings, high risk, bilateral: Secondary | ICD-10-CM | POA: Diagnosis not present

## 2024-05-25 DIAGNOSIS — H2513 Age-related nuclear cataract, bilateral: Secondary | ICD-10-CM | POA: Diagnosis not present

## 2024-06-08 ENCOUNTER — Other Ambulatory Visit (HOSPITAL_COMMUNITY): Payer: Self-pay

## 2024-06-28 ENCOUNTER — Other Ambulatory Visit (HOSPITAL_COMMUNITY): Payer: Self-pay

## 2024-07-07 ENCOUNTER — Other Ambulatory Visit: Payer: Self-pay | Admitting: Internal Medicine

## 2024-07-07 ENCOUNTER — Other Ambulatory Visit (HOSPITAL_COMMUNITY): Payer: Self-pay

## 2024-07-08 ENCOUNTER — Other Ambulatory Visit (HOSPITAL_COMMUNITY): Payer: Self-pay

## 2024-07-08 NOTE — Telephone Encounter (Signed)
 Copied from CRM [id number]. Topic: Clinical - Medication Question >> Jul 08, 2024  4:16 PM Anairis L wrote: Reason for RMF:Ejupzwu is calling following up on medication request.  Meloxicam  15 mg  [place] [facility] [address] [place] [address] Phone: [phone] Fax: [phone] Hours: Mon-Fri 7:30am-7pm; Sat 8:00am-4:30pm

## 2024-07-09 ENCOUNTER — Other Ambulatory Visit (HOSPITAL_COMMUNITY): Payer: Self-pay

## 2024-07-09 MED ORDER — MELOXICAM 15 MG PO TABS
15.0000 mg | ORAL_TABLET | Freq: Every day | ORAL | 1 refills | Status: AC | PRN
  Filled 2024-07-09: qty 90, 90d supply, fill #0
  Filled 2024-10-12: qty 90, 90d supply, fill #1

## 2024-07-26 ENCOUNTER — Other Ambulatory Visit (HOSPITAL_COMMUNITY): Payer: Self-pay

## 2024-07-26 ENCOUNTER — Other Ambulatory Visit: Payer: Self-pay

## 2024-07-26 ENCOUNTER — Other Ambulatory Visit: Payer: Self-pay | Admitting: Internal Medicine

## 2024-07-28 ENCOUNTER — Other Ambulatory Visit (HOSPITAL_COMMUNITY): Payer: Self-pay

## 2024-07-28 MED ORDER — SUCRALFATE 1 G PO TABS
1.0000 g | ORAL_TABLET | Freq: Four times a day (QID) | ORAL | 1 refills | Status: DC
  Filled 2024-07-28: qty 120, 30d supply, fill #0
  Filled 2024-08-25: qty 120, 30d supply, fill #1

## 2024-07-31 ENCOUNTER — Other Ambulatory Visit (HOSPITAL_COMMUNITY): Payer: Self-pay

## 2024-08-25 ENCOUNTER — Other Ambulatory Visit (HOSPITAL_COMMUNITY): Payer: Self-pay

## 2024-09-01 ENCOUNTER — Other Ambulatory Visit (HOSPITAL_COMMUNITY): Payer: Self-pay

## 2024-09-01 ENCOUNTER — Other Ambulatory Visit: Payer: Self-pay

## 2024-09-10 ENCOUNTER — Other Ambulatory Visit (HOSPITAL_COMMUNITY): Payer: Self-pay

## 2024-09-10 MED ORDER — DORZOLAMIDE HCL 2 % OP SOLN
1.0000 [drp] | Freq: Two times a day (BID) | OPHTHALMIC | 3 refills | Status: AC
  Filled 2024-09-10: qty 10, 50d supply, fill #0
  Filled 2024-10-01: qty 10, 50d supply, fill #1
  Filled 2024-10-29: qty 10, 50d supply, fill #2

## 2024-09-13 ENCOUNTER — Other Ambulatory Visit (HOSPITAL_COMMUNITY): Payer: Self-pay

## 2024-09-13 MED ORDER — LATANOPROST 0.005 % OP SOLN
1.0000 [drp] | Freq: Every evening | OPHTHALMIC | 3 refills | Status: AC
  Filled 2024-09-13: qty 7.5, 75d supply, fill #0
  Filled 2024-09-28: qty 7.5, 150d supply, fill #0
  Filled 2024-10-29: qty 7.5, 75d supply, fill #0

## 2024-09-14 ENCOUNTER — Other Ambulatory Visit (HOSPITAL_COMMUNITY): Payer: Self-pay

## 2024-09-28 ENCOUNTER — Other Ambulatory Visit (HOSPITAL_COMMUNITY): Payer: Self-pay

## 2024-10-01 ENCOUNTER — Other Ambulatory Visit (HOSPITAL_COMMUNITY): Payer: Self-pay

## 2024-10-06 ENCOUNTER — Other Ambulatory Visit: Payer: Self-pay | Admitting: Internal Medicine

## 2024-10-06 ENCOUNTER — Other Ambulatory Visit: Payer: Self-pay

## 2024-10-06 ENCOUNTER — Other Ambulatory Visit (HOSPITAL_COMMUNITY): Payer: Self-pay

## 2024-10-06 MED ORDER — SUCRALFATE 1 G PO TABS
1.0000 g | ORAL_TABLET | Freq: Four times a day (QID) | ORAL | 1 refills | Status: DC
  Filled 2024-10-06 – 2024-11-15 (×2): qty 120, 30d supply, fill #0

## 2024-10-07 ENCOUNTER — Other Ambulatory Visit (HOSPITAL_COMMUNITY): Payer: Self-pay

## 2024-10-15 ENCOUNTER — Other Ambulatory Visit (HOSPITAL_COMMUNITY): Payer: Self-pay

## 2024-10-19 ENCOUNTER — Encounter (HOSPITAL_COMMUNITY): Payer: Self-pay | Admitting: *Deleted

## 2024-10-19 ENCOUNTER — Ambulatory Visit (HOSPITAL_COMMUNITY)
Admission: EM | Admit: 2024-10-19 | Discharge: 2024-10-19 | Disposition: A | Discharge status: Home / Self Care | Attending: Student | Admitting: Student

## 2024-10-19 ENCOUNTER — Other Ambulatory Visit: Payer: Self-pay

## 2024-10-19 ENCOUNTER — Other Ambulatory Visit (HOSPITAL_COMMUNITY): Payer: Self-pay

## 2024-10-19 DIAGNOSIS — R1114 Bilious vomiting: Secondary | ICD-10-CM

## 2024-10-19 DIAGNOSIS — K219 Gastro-esophageal reflux disease without esophagitis: Secondary | ICD-10-CM | POA: Diagnosis not present

## 2024-10-19 MED ORDER — ONDANSETRON 4 MG PO TBDP
4.0000 mg | ORAL_TABLET | Freq: Three times a day (TID) | ORAL | 0 refills | Status: AC | PRN
  Filled 2024-10-19 (×2): qty 20, 7d supply, fill #0

## 2024-10-19 MED ORDER — ONDANSETRON HCL 4 MG/2ML IJ SOLN
4.0000 mg | Freq: Once | INTRAMUSCULAR | Status: AC
  Administered 2024-10-19: 10:00:00 4 mg via INTRAMUSCULAR

## 2024-10-19 MED ORDER — LANSOPRAZOLE 30 MG PO CPDR
30.0000 mg | DELAYED_RELEASE_CAPSULE | Freq: Every day | ORAL | 1 refills | Status: AC
  Filled 2024-10-19 (×2): qty 30, 30d supply, fill #0
  Filled 2024-11-15: qty 30, 30d supply, fill #1

## 2024-10-19 MED ORDER — ALUM & MAG HYDROXIDE-SIMETH 200-200-20 MG/5ML PO SUSP
30.0000 mL | Freq: Once | ORAL | Status: AC
  Administered 2024-10-19: 11:00:00 30 mL via ORAL

## 2024-10-19 MED ORDER — ALUM & MAG HYDROXIDE-SIMETH 200-200-20 MG/5ML PO SUSP
ORAL | Status: AC
  Filled 2024-10-19: qty 30

## 2024-10-19 MED ORDER — ONDANSETRON HCL 4 MG/2ML IJ SOLN
INTRAMUSCULAR | Status: AC
  Filled 2024-10-19: qty 2

## 2024-10-19 NOTE — Discharge Instructions (Addendum)
-   For your acid reflux, we need to start you on a daily medication to lower your stomach acid.  Stop pantoprazole , and start lansoprazole .  Take this daily for 30 days. -I think your acid reflux is probably what is causing the nausea and vomiting. -Continue daily carafate  as needed up to 4 times daily -Take the Zofran  (ondansetron ) up to 3 times daily for nausea and vomiting. Dissolve one pill under your tongue or between your teeth and your cheek. - If your symptoms change or worsen, you would need to be seen again, by us  or the ER.  This includes if you see blood in your vomit, dark black or coffee-ground vomit, blood in your poop, dark black poop; or worsening abdominal pain.

## 2024-10-19 NOTE — ED Triage Notes (Signed)
 Pt reports since Sunday he has had ABD pain with vomiting. Pt did not eat for 3 day because due to nausea. PT has not taken any OTC.

## 2024-10-19 NOTE — ED Provider Notes (Signed)
 [facility]    CSN: [id number] Arrival date & time: 10/19/24  0911      History   Chief Complaint Chief Complaint  Patient presents with   Abdominal Pain   Emesis    HPI Cory [name] is a 58 y.o. male presenting w abd pain and nausea with vomiting.  H/o GERD and gastric ulcers and diverticulosis. Describes emesis as bilious. Denies blood or coffee ground emesis. He is tolerating some fluids and a small amount of food. Has vomited 4x today. Endorses crampy abd pain. Denies diarrhea. Last BM was today and was small but normal, without blood or black stool.  Denies prior h/o abd procedure. He is unsure if his current symptoms feel like his typical gastric ulcer symptoms. He stopped his pantoprazole  and started carafate  (unsure when). Has attempted carafate  during present illness but did not tolerate it.  HPI  Past Medical History:  Diagnosis Date   Carpal tunnel syndrome of right wrist    CHF (congestive heart failure) (HCC)    Cholelithiasis    Colon polyps 09/09/2013   hyperplastic   Condyloma    Diverticulosis 09/09/2013   ED (erectile dysfunction)    GERD (gastroesophageal reflux disease)    Hyperglycemia 04/14/2011   Hyperlipidemia    Hypertension    Hypothyroidism    Mild stage glaucoma(365.71)    Dr Sharalyn   Tobacco abuse     Patient Active Problem List   Diagnosis Date Noted   B12 deficiency 02/16/2024   Renal insufficiency 02/16/2024   Increased prostate specific antigen (PSA) velocity 02/16/2024   Medial epicondylitis of elbow, left 12/03/2023   Glaucoma 12/01/2022   Right lateral epicondylitis 12/01/2022   Corn or callus 11/27/2021   Iron deficiency 06/25/2021   Hematochezia 04/13/2020   Arthritis of right acromioclavicular joint 07/22/2019   Right shoulder pain 06/10/2019   Right knee pain 06/10/2019   Headache 01/27/2019   Vitamin D  deficiency 01/11/2018   Angioedema 01/11/2018   Right ear impacted cerumen 12/30/2017   Toe  pain, right 12/30/2017   Tinea cruris 04/08/2017   Numbness of fingers 04/08/2017   Nausea with vomiting 02/14/2016   Diarrhea 02/14/2016   Elbow strain 12/29/2015   Abdominal tenderness, unspecified site 12/12/2015   Gastroesophageal reflux disease with esophagitis 12/12/2015   Dysphagia 12/12/2015   Numbness and tingling in right hand 10/24/2015   Muscle cramps 10/24/2015   Rectal bleeding 07/09/2013   Encounter for well adult exam with abnormal findings 03/19/2013   Hemorrhoids, internal, with bleeding, and prolapse 08/26/2011   Hyperglycemia 04/14/2011   CARPAL TUNNEL SYNDROME, RIGHT 01/08/2011   Hypertension, uncontrolled 09/17/2010   CONDYLOMA 07/30/2010   BACK PAIN 07/23/2010   HAND PAIN, RIGHT 07/23/2010   ERECTILE DYSFUNCTION, ORGANIC 03/12/2010   HOARSENESS 05/12/2009   Hyperlipidemia 01/07/2008   TOBACCO ABUSE 01/07/2008   Hypothyroidism 07/04/2007   CHOLELITHIASIS 07/04/2007    Past Surgical History:  Procedure Laterality Date   COLONOSCOPY W/ BIOPSIES AND POLYPECTOMY  09/09/2013   hyperplastic polyps       Home Medications    Prior to Admission medications  Medication Sig Start Date End Date Taking? Authorizing Provider  amLODipine  (NORVASC ) 5 MG tablet Take 1 tablet (5 mg total) by mouth daily. 12/25/23  Yes Norleen Lynwood ORN, MD  atorvastatin  (LIPITOR) 80 MG tablet Take 1 tablet (80 mg total) by mouth daily. 12/25/23  Yes Norleen Lynwood ORN, MD  Cholecalciferol  50 MCG (2000 UT) TABS Take 1 tablet by mouth daily.  10/05/20  Yes Norleen Lynwood ORN, MD  dorzolamide  (TRUSOPT ) 2 % ophthalmic solution Instill 1 drop into the affected eye(s) twice daily. 03/30/21  Yes   dorzolamide  (TRUSOPT ) 2 % ophthalmic solution Instill 1 drop into both eyes twice a day 12/06/21  Yes   dorzolamide  (TRUSOPT ) 2 % ophthalmic solution Place 1 drop into both eyes twice a day 08/08/23  Yes Norleen Lynwood ORN, MD  dorzolamide  (TRUSOPT ) 2 % ophthalmic solution Place 1 drop into both eyes 2 (two) times daily.  09/10/24  Yes   dorzolamide -timolol  (COSOPT ) 22.3-6.8 MG/ML ophthalmic solution Place 1 drop into both eyes daily. 10/11/14  Yes Sonda Alm BROCKS, MD  lansoprazole  (PREVACID ) 30 MG capsule Take 1 capsule (30 mg total) by mouth daily at 12 noon. 10/19/24  Yes Charli Halle E, PA-C  latanoprost  (XALATAN ) 0.005 % ophthalmic solution Place 1 drop into both eyes 2 (two) times daily. 10/11/14  Yes Sonda Alm BROCKS, MD  latanoprost  (XALATAN ) 0.005 % ophthalmic solution Instill 1 drop to the affected eye(s) every night at bedtime. 03/30/21  Yes   latanoprost  (XALATAN ) 0.005 % ophthalmic solution Instill 1 drop into both eyes at bedtime 12/06/21  Yes   latanoprost  (XALATAN ) 0.005 % ophthalmic solution Instill 1 drop into both eyes at bedtime 08/08/23  Yes Norleen Lynwood ORN, MD  latanoprost  (XALATAN ) 0.005 % ophthalmic solution Place 1 drop into both eyes at bedtime. 09/13/24  Yes   levothyroxine  (SYNTHROID ) 125 MCG tablet Take 1 tablet (125 mcg total) by mouth daily. 03/26/24  Yes Norleen Lynwood ORN, MD  levothyroxine  (SYNTHROID ) 125 MCG tablet Take 1 tablet (125 mcg total) by mouth daily. 04/22/24  Yes Norleen Lynwood ORN, MD  meloxicam  (MOBIC ) 15 MG tablet Take 1 tablet (15 mg total) by mouth daily as needed. 07/09/24  Yes Norleen Lynwood ORN, MD  ondansetron  (ZOFRAN -ODT) 4 MG disintegrating tablet Take 1 tablet (4 mg total) by mouth every 8 (eight) hours as needed for nausea or vomiting. 10/19/24  Yes Iaan Oregel E, PA-C  sucralfate  (CARAFATE ) 1 g tablet Take 1 tablet (1 g total) by mouth 4 (four) times daily. 10/06/24 10/06/25 Yes Norleen Lynwood ORN, MD  dorzolamide  (TRUSOPT ) 2 % ophthalmic solution  12/11/15   [provider]  sildenafil  (VIAGRA ) 100 MG tablet Take 0.5-1 tablets (50-100 mg total) by mouth daily as needed for erectile dysfunction. 02/16/24   Norleen Lynwood ORN, MD    Family History Family History  Problem Relation Age of Onset   Breast cancer Mother        deceased at age 47 secondary to complications of breast cancer    Diabetes Father    Stroke Father    Glaucoma Father    Hypertension Father    Blindness Father        leagally blind   Heart disease Father    Diabetes Brother    Alcohol abuse Brother    Seizures Brother    Other Neg Hx        prostate cancer, colon cancer    Social History Social History[1]   Allergies   Ace inhibitors   Review of Systems Review of Systems  Constitutional:  Negative for appetite change, chills, diaphoresis, fever and unexpected weight change.  HENT:  Negative for congestion, ear pain, sinus pressure, sinus pain, sneezing, sore throat and trouble swallowing.   Respiratory:  Negative for cough, chest tightness and shortness of breath.   Cardiovascular:  Negative for chest pain.  Gastrointestinal:  Positive for abdominal pain, nausea  and vomiting. Negative for abdominal distention, anal bleeding, blood in stool, constipation, diarrhea and rectal pain.  Genitourinary:  Negative for dysuria, flank pain, frequency and urgency.  Musculoskeletal:  Negative for back pain and myalgias.  Neurological:  Negative for dizziness, light-headedness and headaches.     Physical Exam Triage Vital Signs ED Triage Vitals  Encounter Vitals Group     BP 10/19/24 0925 (!) 159/87     Girls Systolic BP Percentile --      Girls Diastolic BP Percentile --      Boys Systolic BP Percentile --      Boys Diastolic BP Percentile --      Pulse Rate 10/19/24 0925 66     Resp 10/19/24 0925 18     Temp 10/19/24 0925 97.9 F (36.6 C)     Temp src --      SpO2 10/19/24 0925 99 %     Weight --      Height --      Head Circumference --      Peak Flow --      Pain Score 10/19/24 0923 7     Pain Loc --      Pain Education --      Exclude from Growth Chart --    No data found.  Updated Vital Signs BP (!) 159/87   Pulse 66   Temp 97.9 F (36.6 C)   Resp 18   SpO2 99%   Visual Acuity Right Eye Distance:   Left Eye Distance:   Bilateral Distance:    Right Eye Near:    Left Eye Near:    Bilateral Near:     Physical Exam Vitals reviewed.  Constitutional:      General: He is not in acute distress.    Appearance: Normal appearance. He is not ill-appearing.  HENT:     Head: Normocephalic and atraumatic.     Mouth/Throat:     Mouth: Mucous membranes are moist.     Comments: Moist mucous membranes Eyes:     Extraocular Movements: Extraocular movements intact.     Pupils: Pupils are equal, round, and reactive to light.  Cardiovascular:     Rate and Rhythm: Normal rate and regular rhythm.     Heart sounds: Normal heart sounds.  Pulmonary:     Effort: Pulmonary effort is normal.     Breath sounds: Normal breath sounds. No wheezing, rhonchi or rales.  Abdominal:     General: Bowel sounds are increased. There is no distension.     Palpations: Abdomen is soft. There is no mass.     Tenderness: There is abdominal tenderness in the epigastric area. There is no right CVA tenderness, left CVA tenderness, guarding or rebound. Negative signs include Murphy's sign, Rovsing's sign and McBurney's sign.     Comments: Increased BS throughout.There is epigastric tenderness to palpation without guarding or rebound.   Skin:    General: Skin is warm.     Capillary Refill: Capillary refill takes less than 2 seconds.     Comments: Good skin turgor  Neurological:     General: No focal deficit present.     Mental Status: He is alert and oriented to person, place, and time.  Psychiatric:        Mood and Affect: Mood normal.        Behavior: Behavior normal.      UC Treatments / Results  Labs (all labs ordered are listed, but only abnormal results  are displayed) Labs Reviewed - No data to display  EKG   Radiology No results found.  Procedures Procedures (including critical care time)  Medications Ordered in UC Medications  ondansetron  (ZOFRAN ) injection 4 mg (4 mg Intramuscular Given 10/19/24 0957)  alum & mag hydroxide-simeth (MAALOX/MYLANTA) 200-200-20  MG/5ML suspension 30 mL (30 mLs Oral Given 10/19/24 1032)    Initial Impression / Assessment and Plan / UC Course  I have reviewed the triage vital signs and the nursing notes.  Pertinent labs & imaging results that were available during my care of the patient were reviewed by me and considered in my medical decision making (see chart for details).     Patient is a pleasant 58 y.o. male presenting with GERD and nausea with vomiting. The patient is afebrile and nontachycardic.  Antipyretic has not been administered today.  On exam, there is epigastric tenderness to palpation.  The patient denies hematemesis, melena, hematochezia, coffee-ground emesis.  We administered IM Zofran  and GI cocktail.  Following this, he notes slight improvement in his epigastric pain, nausea, and vomiting.  We attempted a p.o. challenge, and he tolerated fluids for 20 minutes.  His GERD is currently poorly controlled, as he stopped his pantoprazole  a few months ago, and has only been taking Carafate  as needed (infrequently).  He has concerns that the pantoprazole  was not effective.  We will change this to lansoprazole  daily, and I encouraged him to take the Carafate  more often.  I also sent ondansetron  to have on hand.  Return precautions as below.    Final Clinical Impressions(s) / UC Diagnoses   Final diagnoses:  Bilious vomiting with nausea  Gastroesophageal reflux disease without esophagitis     Discharge Instructions      - For your acid reflux, we need to start you on a daily medication to lower your stomach acid.  Stop pantoprazole , and start lansoprazole .  Take this daily for 30 days. -I think your acid reflux is probably what is causing the nausea and vomiting. -Continue daily carafate  as needed up to 4 times daily -Take the Zofran  (ondansetron ) up to 3 times daily for nausea and vomiting. Dissolve one pill under your tongue or between your teeth and your cheek. - If your symptoms change or worsen,  you would need to be seen again, by us  or the ER.  This includes if you see blood in your vomit, dark black or coffee-ground vomit, blood in your poop, dark black poop; or worsening abdominal pain.     ED Prescriptions     Medication Sig Dispense Auth. Provider   lansoprazole  (PREVACID ) 30 MG capsule Take 1 capsule (30 mg total) by mouth daily at 12 noon. 30 capsule Lalena Salas E, PA-C   ondansetron  (ZOFRAN -ODT) 4 MG disintegrating tablet Take 1 tablet (4 mg total) by mouth every 8 (eight) hours as needed for nausea or vomiting. 20 tablet Meiya Wisler E, PA-C      PDMP not reviewed this encounter.     [1]  Social History Tobacco Use   Smoking status: Every Day    Current packs/day: 0.75    Average packs/day: 0.8 packs/day for 38.0 years (28.5 ttl pk-yrs)    Types: Cigarettes   Smokeless tobacco: Never   Tobacco comments:    light smoker since age 78  Vaping Use   Vaping status: Never Used  Substance Use Topics   Alcohol use: Yes    Alcohol/week: 6.0 standard drinks of alcohol    Types: 6 Cans  of beer per week   Drug use: Not Currently    Types: Marijuana    Comment: OCCASIONAL none since 08/11/14     Arlyss Leita BRAVO, PA-C 10/19/24 1051

## 2024-10-29 ENCOUNTER — Other Ambulatory Visit (HOSPITAL_COMMUNITY): Payer: Self-pay

## 2024-11-02 ENCOUNTER — Other Ambulatory Visit (HOSPITAL_COMMUNITY): Payer: Self-pay

## 2024-11-15 ENCOUNTER — Other Ambulatory Visit (HOSPITAL_COMMUNITY): Payer: Self-pay

## 2024-11-16 ENCOUNTER — Other Ambulatory Visit (HOSPITAL_COMMUNITY): Payer: Self-pay

## 2024-12-28 ENCOUNTER — Encounter: Admitting: Medical
